# Patient Record
Sex: Male | Born: 1990
Health system: Southern US, Community
[De-identification: ages and names within clinical notes are randomized; demographics above are authoritative.]

## PROBLEM LIST (undated history)

## (undated) DIAGNOSIS — F909 Attention-deficit hyperactivity disorder, unspecified type: Secondary | ICD-10-CM

## (undated) DIAGNOSIS — F845 Asperger's syndrome: Secondary | ICD-10-CM

## (undated) DIAGNOSIS — K219 Gastro-esophageal reflux disease without esophagitis: Secondary | ICD-10-CM

## (undated) DIAGNOSIS — F32A Depression, unspecified: Secondary | ICD-10-CM

## (undated) DIAGNOSIS — F840 Autistic disorder: Secondary | ICD-10-CM

## (undated) HISTORY — DX: Asperger's syndrome: F84.5

## (undated) HISTORY — DX: Depression, unspecified: F32.A

## (undated) HISTORY — DX: Autistic disorder: F84.0

## (undated) HISTORY — PX: LACERATION REPAIR: SHX5168

---
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
## 2009-11-21 ENCOUNTER — Emergency Department (HOSPITAL_COMMUNITY): Admission: EM | Admit: 2009-11-21 | Discharge: 2009-11-21 | Payer: Self-pay | Admitting: Emergency Medicine

## 2010-03-19 ENCOUNTER — Encounter: Payer: Self-pay | Admitting: Family Medicine

## 2010-09-02 ENCOUNTER — Encounter: Payer: Self-pay | Admitting: Family Medicine

## 2010-11-12 ENCOUNTER — Ambulatory Visit: Admission: RE | Admit: 2010-11-12 | Discharge: 2010-11-12 | Payer: Self-pay | Source: Home / Self Care

## 2010-11-17 NOTE — Miscellaneous (Signed)
   Clinical Lists Changes  Problems: Removed problem of ASTHMA, UNSPECIFIED (ICD-493.90) 

## 2010-11-17 NOTE — Miscellaneous (Signed)
  Clinical Lists Changes  Medications: Removed medication of TUSSIONEX PENNKINETIC ER 8-10 MG/5ML LQCR (CHLORPHENIRAMINE-HYDROCODONE) 1 teaspoon twice a day as need for cough. dispense 60 ml Removed medication of RANITIDINE HCL 150 MG TABS (RANITIDINE HCL) Take 1 tablet by mouth twice a day Removed medication of TESSALON PERLES 100 MG  CAPS (BENZONATATE) 1 TAB by mouth every 8 hrs as needed cough Removed medication of CLARITIN 10 MG  TABS (LORATADINE) 1 tab by mouth daily Removed medication of NASACORT AQ 55 MCG/ACT  AERS (TRIAMCINOLONE ACETONIDE(NASAL)) 1 spray per nostril daily

## 2010-11-19 NOTE — Assessment & Plan Note (Signed)
Summary: cpe/kh   Vital Signs:  Patient profile:   20 year old male Height:      71.75 inches Weight:      216.9 pounds BMI:     29.73 Temp:     98.7 degrees F oral Pulse rate:   86 / minute BP sitting:   125 / 84  (left arm) Cuff size:   large  Vitals Entered By: Derrick Carey, CMA (November 12, 2010 3:57 PM) CC: CPE Is Patient Diabetic? No Pain Assessment Patient in pain? no        Primary Care Provider:  . [facility]  CC:  CPE.  History of Present Illness: 20 yo male here for routine physical exam.  UTD on shots. Refusing flu shot.  Hx ADHD but off all meds.  Current Medications (verified): 1)  None  Allergies (verified): No Known Drug Allergies  Past History:  Past Medical History: Last updated: 02/17/2007 GE Reflux Acne Allergic rhinitis "knocked out" front teeth  Family History: Reviewed history from 02/17/2007 and no changes required. Father with COPD, HTN, Migraines Mother: Obesity, Migraines, HTN  Social History: Has no siblings, mother Derrick Carey) is overweight.   Doing well in school, currently at [employer] getting good grades.  Review of Systems       See HPI  Physical Exam  General:  Well-developed,well-nourished,in no acute distress; alert,appropriate and cooperative throughout examination Head:  Normocephalic and atraumatic without obvious abnormalities. No apparent alopecia or balding. Eyes:  No corneal or conjunctival inflammation noted. EOMI. Perrla.  Ears:  External ear exam shows no significant lesions or deformities.  Otoscopic examination reveals clear canals, tympanic membranes are intact bilaterally without bulging, retraction, inflammation or discharge. Hearing is grossly normal bilaterally. Nose:  External nasal examination shows no deformity or inflammation. Nasal mucosa are pink and moist without lesions or exudates. Mouth:  Oral mucosa and oropharynx without lesions or exudates.   Neck:  No deformities, masses, or  tenderness noted. Lungs:  Normal respiratory effort, chest expands symmetrically. Lungs are clear to auscultation, no crackles or wheezes. Heart:  Normal rate and regular rhythm. S1 and S2 normal without gallop, murmur, click, rub or other extra sounds. Abdomen:  Bowel sounds positive,abdomen soft and non-tender without masses, organomegaly or hernias noted. Genitalia:  Refused. Msk:  No deformity or scoliosis noted of thoracic or lumbar spine.   Pulses:  R and L carotid,radial,femoral,dorsalis pedis and posterior tibial pulses are full and equal bilaterally Extremities:  No clubbing, cyanosis, edema, or deformity noted with normal full range of motion of all joints.   Neurologic:  No cranial nerve deficits noted. Station and gait are normal. Plantar reflexes are down-going bilaterally. DTRs are symmetrical throughout. Sensory, motor and coordinative functions appear intact. Skin:  Acne vulgaris on face.   Impression & Recommendations:  Problem # 1:  HEALTH MAINTENANCE EXAM (ICD-V70.0) Assessment New Normal exam. Guidance given.  Orders: [facility] - Est  18-39 yrs (16109)  Problem # 2:  ATTENTION DEFICIT, W/HYPERACTIVITY (ICD-314.01) Assessment: Improved Off meds, doing well in school. No desire to restart.  Orders: [facility] - Est  18-39 yrs (60454)  Problem # 3:  ACNE (ICD-706.1) Assessment: Comment Only Can discuss at next visit if pt desires.   Orders Added: 1)  [facility] - Est  18-39 yrs [99395]

## 2010-11-30 ENCOUNTER — Emergency Department (HOSPITAL_COMMUNITY)
Admission: EM | Admit: 2010-11-30 | Discharge: 2010-11-30 | Disposition: A | Discharge status: Home / Self Care | Payer: No Typology Code available for payment source | Attending: Emergency Medicine | Admitting: Emergency Medicine

## 2010-11-30 DIAGNOSIS — S139XXA Sprain of joints and ligaments of unspecified parts of neck, initial encounter: Secondary | ICD-10-CM | POA: Insufficient documentation

## 2010-11-30 DIAGNOSIS — M79609 Pain in unspecified limb: Secondary | ICD-10-CM | POA: Insufficient documentation

## 2010-11-30 DIAGNOSIS — R51 Headache: Secondary | ICD-10-CM | POA: Insufficient documentation

## 2010-11-30 DIAGNOSIS — M62838 Other muscle spasm: Secondary | ICD-10-CM | POA: Insufficient documentation

## 2011-01-12 ENCOUNTER — Ambulatory Visit (INDEPENDENT_AMBULATORY_CARE_PROVIDER_SITE_OTHER): Payer: No Typology Code available for payment source | Admitting: Sports Medicine

## 2011-01-12 ENCOUNTER — Encounter: Payer: Self-pay | Admitting: Sports Medicine

## 2011-01-12 VITALS — BP 130/76 | HR 70 | Temp 97.6°F | Ht 71.5 in | Wt 218.4 lb

## 2011-01-12 DIAGNOSIS — S335XXA Sprain of ligaments of lumbar spine, initial encounter: Secondary | ICD-10-CM

## 2011-01-12 DIAGNOSIS — S39012A Strain of muscle, fascia and tendon of lower back, initial encounter: Secondary | ICD-10-CM | POA: Insufficient documentation

## 2011-01-12 MED ORDER — MELOXICAM 15 MG PO TABS: 15.0000 mg | ORAL_TABLET | Freq: Every day | ORAL | Status: DC

## 2011-01-12 NOTE — Patient Instructions (Signed)

## 2011-01-12 NOTE — Assessment & Plan Note (Signed)
No radicular symptoms. No signs cord compression/CES. Mobic daily. Rehab exercises.
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
## 2011-02-01 ENCOUNTER — Ambulatory Visit: Payer: Self-pay | Admitting: Sports Medicine

## 2011-02-02 ENCOUNTER — Encounter: Payer: Self-pay | Admitting: Sports Medicine

## 2011-02-02 ENCOUNTER — Ambulatory Visit (INDEPENDENT_AMBULATORY_CARE_PROVIDER_SITE_OTHER): Payer: Self-pay | Admitting: Sports Medicine

## 2011-02-02 VITALS — BP 118/86 | HR 80 | Temp 98.2°F | Wt 220.2 lb

## 2011-02-02 DIAGNOSIS — S335XXA Sprain of ligaments of lumbar spine, initial encounter: Secondary | ICD-10-CM

## 2011-02-02 DIAGNOSIS — S39012A Strain of muscle, fascia and tendon of lower back, initial encounter: Secondary | ICD-10-CM

## 2011-02-02 NOTE — Assessment & Plan Note (Signed)
Resolved

## 2011-02-02 NOTE — Progress Notes (Signed)
  Subjective:    Patient ID: Derrick Carey, male    DOB: 17-Mar-1991, 20 y.o.   MRN: [id number]  HPI Pt returns for r/u of lumbar strain.  He has been doing sports med rehab exercises and taking mobic.  Pain is 100% resolved and he is back to baseline.  No pain, HA, N/V/C/D, SOB/CP, stool/urinary incontinence.   Review of Systems    See HPI Objective:   Physical Exam  Constitutional: He appears well-developed and well-nourished. No distress.  Musculoskeletal:       Gait unremarkable.  No TTP over back.  Skin: Skin is warm and dry.       Inflammatory acne noted on face.          Assessment & Plan:

## 2011-06-23 ENCOUNTER — Ambulatory Visit: Payer: Self-pay | Admitting: Family Medicine

## 2011-06-24 ENCOUNTER — Ambulatory Visit: Payer: Self-pay | Admitting: Family Medicine

## 2011-07-02 ENCOUNTER — Ambulatory Visit: Payer: Self-pay | Admitting: Family Medicine

## 2011-07-20 ENCOUNTER — Ambulatory Visit: Payer: Self-pay | Admitting: Family Medicine

## 2011-08-12 ENCOUNTER — Ambulatory Visit (INDEPENDENT_AMBULATORY_CARE_PROVIDER_SITE_OTHER): Payer: Self-pay | Admitting: Family Medicine

## 2011-08-12 ENCOUNTER — Encounter: Payer: Self-pay | Admitting: Family Medicine

## 2011-08-12 VITALS — BP 130/76 | HR 69 | Temp 98.5°F | Ht 72.0 in | Wt 223.0 lb

## 2011-08-12 DIAGNOSIS — R053 Chronic cough: Secondary | ICD-10-CM | POA: Insufficient documentation

## 2011-08-12 DIAGNOSIS — J069 Acute upper respiratory infection, unspecified: Secondary | ICD-10-CM

## 2011-08-12 DIAGNOSIS — R05 Cough: Secondary | ICD-10-CM | POA: Insufficient documentation

## 2011-08-12 DIAGNOSIS — Z23 Encounter for immunization: Secondary | ICD-10-CM

## 2011-08-12 HISTORY — DX: Chronic cough: R05.3

## 2011-08-12 NOTE — Patient Instructions (Addendum)
You have a virus which your body will fight off on its own. If you develop fevers, face pain, worsening of symptoms in next 7-10 days, come back to clinic because you may have developed a bacterial infection. Nasal saline spray helps clear out sinuses. Throat discomfort try motrin, cough drops or salt water gargling.  Upper Respiratory Infection, Adult An upper respiratory infection (URI) is also known as the common cold. It is often caused by a type of germ (virus). Colds are easily spread (contagious). You can pass it to others by kissing, coughing, sneezing, or drinking out of the same glass. Usually, you get better in 1 or 2 weeks.  HOME CARE   Only take medicine as told by your doctor.   Use a warm mist humidifier or breathe in steam from a hot shower.   Drink enough water and fluids to keep your pee (urine) clear or pale yellow.   Get plenty of rest.   Return to work when your temperature is back to normal or as told by your doctor. You may use a face mask and wash your hands to stop your cold from spreading.  GET HELP RIGHT AWAY IF:   After the first few days, you feel you are getting worse.   You have questions about your medicine.   You have chills, shortness of breath, or brown or red spit (mucus).   You have yellow or brown snot (nasal discharge) or pain in the face, especially when you bend forward.   You have a fever, puffy (swollen) neck, pain when you swallow, or white spots in the back of your throat.   You have a bad headache, ear pain, sinus pain, or chest pain.   You have a high-pitched whistling sound when you breathe in and out (wheezing).   You have a lasting cough or cough up blood.   You have sore muscles or a stiff neck.  MAKE SURE YOU:   Understand these instructions.   Will watch your condition.   Will get help right away if you are not doing well or get worse.  Document Released: 03/22/2008 Document Revised: 06/16/2011 Document Reviewed:  02/08/2011 [facility] Patient Information 2012 [place], [place].

## 2011-08-12 NOTE — Assessment & Plan Note (Addendum)
Consistent with viral URI symptoms: no fever or lymphadenopathy concerning for bacterial pharyngitis. Discussed symptomatic treatment for her sore throat and nasal congestion. Discussed red flag symptoms including facial pain, fever, worsening symptoms in the next 7-10 days. Refused flu vaccine, given TDap today.

## 2011-08-12 NOTE — Progress Notes (Signed)
  Subjective:    Patient ID: Derrick Carey, male    DOB: May 16, 1991, 20 y.o.   MRN: [id number]  HPI  1. URI symptoms. Pt c/o sore throat, post-nasal drip and cough productive of green sputum for 1 week, symptoms are stable. He also states that he has some sinus pressure that started today. Patient says that he might have had a fever last night and this morning but did not check it and doesn't know for sure. His mother and grandmother have the same symptoms.   He denies headache, sinus pain, abdominal pain, earache, itchy eyes, loss of appetite, SOB or chest pain. He is not a smoker, but is exposed to second hand smoke.    Review of Systems See HPI otherwise negative.    Objective:   Physical Exam  Vitals reviewed. Constitutional: He appears well-developed and well-nourished. No distress.  HENT:  Head: Normocephalic.  Right Ear: External ear normal.  Left Ear: External ear normal.  Mouth/Throat: Oropharynx is clear and moist. No oropharyngeal exudate.       Injected posterior pharynx.  pururlent nasal congestion. No sinus TTP.  Eyes: Pupils are equal, round, and reactive to light.  Neck: Neck supple. No tracheal deviation present. No thyromegaly present.  Cardiovascular: Normal rate, regular rhythm and normal heart sounds.  Exam reveals no gallop and no friction rub.   No murmur heard. Pulmonary/Chest: Effort normal and breath sounds normal. No respiratory distress. He has no wheezes. He has no rales. He exhibits no tenderness.  Lymphadenopathy:    He has no cervical adenopathy.  Skin: He is not diaphoretic.  Psychiatric: He has a normal mood and affect. His behavior is normal.          Assessment & Plan:

## 2011-08-19 ENCOUNTER — Emergency Department (HOSPITAL_BASED_OUTPATIENT_CLINIC_OR_DEPARTMENT_OTHER)
Admission: EM | Admit: 2011-08-19 | Discharge: 2011-08-19 | Disposition: A | Discharge status: Home / Self Care | Payer: Self-pay | Attending: Emergency Medicine | Admitting: Emergency Medicine

## 2011-08-19 ENCOUNTER — Emergency Department (INDEPENDENT_AMBULATORY_CARE_PROVIDER_SITE_OTHER): Payer: Self-pay

## 2011-08-19 ENCOUNTER — Encounter (HOSPITAL_BASED_OUTPATIENT_CLINIC_OR_DEPARTMENT_OTHER): Payer: Self-pay | Admitting: *Deleted

## 2011-08-19 DIAGNOSIS — R05 Cough: Secondary | ICD-10-CM

## 2011-08-19 DIAGNOSIS — R059 Cough, unspecified: Secondary | ICD-10-CM | POA: Insufficient documentation

## 2011-08-19 DIAGNOSIS — B9789 Other viral agents as the cause of diseases classified elsewhere: Secondary | ICD-10-CM | POA: Insufficient documentation

## 2011-08-19 DIAGNOSIS — J3489 Other specified disorders of nose and nasal sinuses: Secondary | ICD-10-CM | POA: Insufficient documentation

## 2011-08-19 DIAGNOSIS — B349 Viral infection, unspecified: Secondary | ICD-10-CM

## 2011-08-19 DIAGNOSIS — K219 Gastro-esophageal reflux disease without esophagitis: Secondary | ICD-10-CM | POA: Insufficient documentation

## 2011-08-19 DIAGNOSIS — R509 Fever, unspecified: Secondary | ICD-10-CM

## 2011-08-19 HISTORY — DX: Gastro-esophageal reflux disease without esophagitis: K21.9

## 2011-08-19 HISTORY — DX: Attention-deficit hyperactivity disorder, unspecified type: F90.9

## 2011-08-19 MED ORDER — HYDROCOD POLST-CHLORPHEN POLST 10-8 MG/5ML PO LQCR: 5.0000 mL | Freq: Every evening | ORAL | Status: DC | PRN

## 2011-08-19 NOTE — ED Notes (Signed)
Pt c/o nasal congestion and pressure

## 2011-08-19 NOTE — ED Provider Notes (Signed)
History     CSN: [id number] Arrival date & time: 08/19/2011  8:18 PM   First MD Initiated Contact with Patient 08/19/11 2020      Chief Complaint  Patient presents with  . Nasal Congestion  . Cough    (Consider location/radiation/quality/duration/timing/severity/associated sxs/prior treatment) Patient is a 20 y.o. male presenting with cough. The history is provided by the patient. No language interpreter was used.  Cough This is a new problem. The current episode started more than 1 week ago. The problem occurs constantly. The problem has not changed since onset.The cough is productive of sputum. There has been no fever. Associated symptoms include ear congestion, rhinorrhea and sore throat. He has tried nothing for the symptoms. He is not a smoker.    Past Medical History  Diagnosis Date  . GERD (gastroesophageal reflux disease)   . ADHD (attention deficit hyperactivity disorder)     History reviewed. No pertinent past surgical history.  History reviewed. No pertinent family history.  History  Substance Use Topics  . Smoking status: Never Smoker   . Smokeless tobacco: Not on file  . Alcohol Use: Not on file      Review of Systems  HENT: Positive for sore throat and rhinorrhea.   Respiratory: Positive for cough.   All other systems reviewed and are negative.    Allergies  Sulfa antibiotics  Home Medications   Current Outpatient Rx  Name Route Sig Dispense Refill  . ESOMEPRAZOLE MAGNESIUM 40 MG PO CPDR Oral Take 40 mg by mouth daily.      . GUAIFENESIN 100 MG/5ML PO SOLN Oral Take 10 mLs by mouth 2 (two) times daily as needed. For cough      . HYDROCOD POLST-CHLORPHEN POLST 10-8 MG/5ML PO LQCR Oral Take 5 mLs by mouth at bedtime as needed. 140 mL 0    BP 127/81  Pulse 93  Temp(Src) 98.3 F (36.8 C) (Oral)  Resp 16  Ht 6\' 3"  (1.905 m)  Wt 210 lb (95.255 kg)  BMI 26.25 kg/m2  SpO2 98%  Physical Exam  Nursing note and vitals reviewed. Constitutional:  He is oriented to person, place, and time. He appears well-developed and well-nourished.  HENT:  Head: Normocephalic and atraumatic.  Right Ear: External ear normal.  Left Ear: External ear normal.  Nose: Rhinorrhea present.  Mouth/Throat: Posterior oropharyngeal erythema present.  Cardiovascular: Normal rate and regular rhythm.   Pulmonary/Chest: Effort normal and breath sounds normal.  Musculoskeletal: Normal range of motion.  Neurological: He is alert and oriented to person, place, and time.  Skin: Skin is warm and dry.  Psychiatric: He has a normal mood and affect.    ED Course  Procedures (including critical care time)  Labs Reviewed - No data to display Dg Chest 2 View  08/19/2011  *RADIOLOGY REPORT*  Clinical Data: Cough, intermittent fever  CHEST - 2 VIEW  Comparison: None  Findings: Normal heart size, mediastinal contours, and pulmonary vascularity. Lungs clear. Bones unremarkable. No pneumothorax.  IMPRESSION: Normal exam.  Original Report Authenticated By: Lollie Marrow, M.D.     1. Viral illness   2. Cough       MDM  No need for antibiotics at this time:will treat symptomatically    Medical screening examination/treatment/procedure(s) were performed by non-physician practitioner and as supervising physician I was immediately available for consultation/collaboration. Osvaldo Human, M.D.     Teressa Lower, NP 08/19/11 2105  Carleene Cooper III, MD 08/20/11 [phone]

## 2011-09-07 ENCOUNTER — Encounter: Payer: Self-pay | Admitting: Family Medicine

## 2011-09-07 ENCOUNTER — Ambulatory Visit (INDEPENDENT_AMBULATORY_CARE_PROVIDER_SITE_OTHER): Payer: Self-pay | Admitting: Family Medicine

## 2011-09-07 VITALS — BP 124/83 | HR 109 | Temp 99.1°F | Ht 72.0 in | Wt 226.4 lb

## 2011-09-07 DIAGNOSIS — J069 Acute upper respiratory infection, unspecified: Secondary | ICD-10-CM

## 2011-09-07 MED ORDER — AZITHROMYCIN 500 MG PO TABS: 500.0000 mg | ORAL_TABLET | Freq: Every day | ORAL | Status: AC

## 2011-09-07 MED ORDER — PREDNISONE 50 MG PO TABS: 50.0000 mg | ORAL_TABLET | Freq: Every day | ORAL | Status: DC

## 2011-09-07 MED ORDER — GUAIFENESIN-CODEINE 100-10 MG/5ML PO SYRP: 5.0000 mL | ORAL_SOLUTION | Freq: Three times a day (TID) | ORAL | Status: AC | PRN

## 2011-09-07 NOTE — Assessment & Plan Note (Signed)
Because patient has had cough symptoms for over one month, will give prescription for azithromycin 500 mg daily x5 days. Although not documented, father states that patient has a history of asthma. Per family's request, will give a short prednisone burst, prednisone 50 mg one tablet daily x5 days. Will also give prescription for her Robitussin a.c. Advised patient to take before bedtime and avoid any driving or operating heavy machinery. Patient to followup with his PCP in 2 weeks or sooner if symptoms worsen.  Family and patient agree with plan.

## 2011-09-07 NOTE — Progress Notes (Signed)
  Subjective:    Patient ID: Derrick Carey, male    DOB: 29-Jul-1991, 20 y.o.   MRN: [id number]  HPI  This is a 20 year old male who presents as a work in for persistent cough. History is provided by father. Cough has been going on for about one month. Patient was seen by Dr. Cristal Ford about one month ago and was diagnosed with viral URI. Was given over-the-counter prescriptions for Robitussin and Tussionex. Patient states that neither medications relieved his symptoms. He states that rhinorrhea, congestion has resolved, but cough persists. Cough is productive, yellow phlegm at times. Cough is worse at night when he lays down. Coughing spells lead to sore throat, chest discomfort, and posttussive emesis x1.   Denies any subjective fevers at home, chills, night sweats. Patient still has a good appetite. No changes in urine output or bowel movements. Of note, father says patient has a history of asthma (not documented) and when he was a child he was given a short course of prednisone which relieved his symptoms.  Review of Systems Per history of present illness    Objective:   Physical Exam  Constitutional: No distress.       Coughing throughout encounter  HENT:  Head: Normocephalic and atraumatic.  Nose: Nose normal.  Mouth/Throat: Oropharynx is clear and moist. No oropharyngeal exudate.  Eyes: Right eye exhibits no discharge. Left eye exhibits no discharge.  Neck: Normal range of motion.  Cardiovascular: Normal heart sounds.  Exam reveals no gallop and no friction rub.   No murmur heard. Pulmonary/Chest: Effort normal and breath sounds normal. He has no wheezes. He has no rales.  Lymphadenopathy:    He has no cervical adenopathy.  Skin: Skin is warm. No rash noted. No erythema.  Psychiatric: He is slowed and withdrawn. He is attentive.          Assessment & Plan:

## 2011-09-07 NOTE — Patient Instructions (Signed)
If symptoms do not improve in 2 weeks, please schedule appointment with PCP. Hope you feel better!  Cough, Adult  A cough is a reflex that helps clear your throat and airways. It can help heal the body or may be a reaction to an irritated airway. A cough may only last 2 or 3 weeks (acute) or may last more than 8 weeks (chronic).  CAUSES Acute cough:  Viral or bacterial infections.  Chronic cough:  Infections.   Allergies.   Asthma.   Post-nasal drip.   Smoking.   Heartburn or acid reflux.   Some medicines.   Chronic lung problems (COPD).   Cancer.  SYMPTOMS   Cough.   Fever.   Chest pain.   Increased breathing rate.   High-pitched whistling sound when breathing (wheezing).   Colored mucus that you cough up (sputum).  TREATMENT   A bacterial cough may be treated with antibiotic medicine.   A viral cough must run its course and will not respond to antibiotics.   Your caregiver may recommend other treatments if you have a chronic cough.  HOME CARE INSTRUCTIONS   Only take over-the-counter or prescription medicines for pain, discomfort, or fever as directed by your caregiver. Use cough suppressants only as directed by your caregiver.   Use a cold steam vaporizer or humidifier in your bedroom or home to help loosen secretions.   Sleep in a semi-upright position if your cough is worse at night.   Rest as needed.   Stop smoking if you smoke.  SEEK IMMEDIATE MEDICAL CARE IF:   You have pus in your sputum.   Your cough starts to worsen.   You cannot control your cough with suppressants and are losing sleep.   You begin coughing up blood.   You have difficulty breathing.   You develop pain which is getting worse or is uncontrolled with medicine.   You have a fever.  MAKE SURE YOU:   Understand these instructions.   Will watch your condition.   Will get help right away if you are not doing well or get worse.  Document Released: 04/02/2011 Document  Revised: 06/16/2011 Document Reviewed: 04/02/2011 [facility] Patient Information 2012 [place], [place].

## 2011-10-05 ENCOUNTER — Encounter: Payer: Self-pay | Admitting: Family Medicine

## 2011-10-05 ENCOUNTER — Ambulatory Visit (INDEPENDENT_AMBULATORY_CARE_PROVIDER_SITE_OTHER): Payer: Self-pay | Admitting: Family Medicine

## 2011-10-05 DIAGNOSIS — J069 Acute upper respiratory infection, unspecified: Secondary | ICD-10-CM

## 2011-10-05 DIAGNOSIS — F909 Attention-deficit hyperactivity disorder, unspecified type: Secondary | ICD-10-CM

## 2011-10-05 MED ORDER — HYDROCOD POLST-CHLORPHEN POLST 10-8 MG/5ML PO LQCR: 5.0000 mL | Freq: Two times a day (BID) | ORAL | Status: DC | PRN

## 2011-10-05 MED ORDER — METHYLPHENIDATE HCL 10 MG PO TABS: 10.0000 mg | ORAL_TABLET | Freq: Two times a day (BID) | ORAL | Status: DC

## 2011-10-05 NOTE — Progress Notes (Signed)
Subjective: Pt presents today with two complaints. 1) URI.  Pt reports that he was treated for URI about 3 weeks ago.  Symptoms had completely resolved as of 2 weeks ago, then about 4 days ago began to happen again.  Has cough, congestion, post nasal drip.  Cough is relatively non-productive and is his main complaint.  No fever/chills, shortness of breath.  Is around smokers a fair amount and has several contacts who have similar symptoms.  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  Has been tried on ritalan, concerta, and adderall for this.  Took himself off of them several years ago because he didn't like how they made him feel.  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  Is interested in trying ritalan.  Does continue to have problems with inattention that cause problems with his day-to-day function but doesn't want "to feel like a zombie."  Objective:  Filed Vitals:   10/05/11 0910  BP: 124/80  Pulse: 72  Temp: 98.7 F (37.1 C)   Gen: NAD HEENT: TM clear, clear nasal drainage, no facial tenderness CV: RRR Resp: CTABL, has coughing spells Ext: No edema, 2+ pulses  Assessment/Plan: Symptomatic treatment for URI, attempt start Ritalan for ADHD.  RTC 1 month.  Please also see individual problems in problem list for problem-specific plans.

## 2011-10-05 NOTE — Assessment & Plan Note (Signed)
Asked patient to talk with parents about finding some of the documentation from his interactions with mental health.  Will give prescription for Ritalan.  As patient has only orange card, if he cannot afford this, instructed him to talk with MAP program and advise me on what medications are a possibility.  I will write prescription for a one month supply of this medication.

## 2011-10-05 NOTE — Patient Instructions (Signed)
I am giving you a prescription for Ritalin. Unfortunately, the health Department will not stop this medication. If you have an orange card he may use to [facility]. However, I do not believe that this medication, or any other medications for ADHD, are on the orange card formulary. I believe Ritalin would be the least expensive. You can also talk with the health department about what they would cover her through the medication assistance program. I am giving you a refill on the Tussionex. He can take it up to 2 times daily. Unfortunately, that is going to be the best thing for your cough. If you start having significant problems with fevers, chills, or facial pain, please come back to see [place].

## 2011-10-05 NOTE — Assessment & Plan Note (Signed)
Got completely better, now with new episode.  <10 days, no evidence of bacterial infection.  Symptomatic tx only.

## 2011-10-26 ENCOUNTER — Encounter: Payer: Self-pay | Admitting: Family Medicine

## 2011-10-26 ENCOUNTER — Ambulatory Visit (INDEPENDENT_AMBULATORY_CARE_PROVIDER_SITE_OTHER): Payer: Self-pay | Admitting: Family Medicine

## 2011-10-26 VITALS — BP 129/79 | HR 79 | Temp 98.2°F | Ht 72.0 in | Wt 219.4 lb

## 2011-10-26 DIAGNOSIS — A499 Bacterial infection, unspecified: Secondary | ICD-10-CM

## 2011-10-26 DIAGNOSIS — B9689 Other specified bacterial agents as the cause of diseases classified elsewhere: Secondary | ICD-10-CM

## 2011-10-26 DIAGNOSIS — J329 Chronic sinusitis, unspecified: Secondary | ICD-10-CM

## 2011-10-26 DIAGNOSIS — J209 Acute bronchitis, unspecified: Secondary | ICD-10-CM

## 2011-10-26 MED ORDER — FLUTICASONE PROPIONATE 50 MCG/ACT NA SUSP: 2.0000 | Freq: Every day | NASAL | Status: DC

## 2011-10-26 MED ORDER — GUAIFENESIN-CODEINE 100-10 MG/5ML PO SYRP: 5.0000 mL | ORAL_SOLUTION | Freq: Three times a day (TID) | ORAL | Status: AC | PRN

## 2011-10-26 MED ORDER — AMOXICILLIN 500 MG PO CAPS: 500.0000 mg | ORAL_CAPSULE | Freq: Three times a day (TID) | ORAL | Status: AC

## 2011-10-26 NOTE — Progress Notes (Signed)
Subjective: The patient is a 21 y.o. year old male who presents today for continued problems with cough, postnasal drip,  And sinus pressure.  The patient reports that his symptoms have continue to wax and wane since his last appointment.  His cough continues to be the most bothersome feature.  He has not been running significant fevers.  His cough is only mildly productive of clear sputum.  He has not been having fevers, chills, shortness of breath, chest pain, palpitations, but does admit to some sinus pressure.  Objective:  Filed Vitals:   10/26/11 1408  BP: 129/79  Pulse: 79  Temp: 98.2 F (36.8 C)   Gen: well developed male, coughing intermittently, no acute distress H EENT: mucous membranes moist, trachea midline.  Mild cobble stoning of the pharynx.  There is mild bilateral facial tenderness.  No cervical lymphadenopathy. CV: regular rate and rhythm, no murmurs appreciated Resp: slightly course breath sounds, good air movement bilaterally, no wheezes Ext: less than 2 second capillary refill  Assessment/Plan: Possible sinus infection with the subacute bronchitis.  I also wonder if there is an allergic component seeing the the patient has had relatively poor response to other treatments.  I will try a cough suppressant with codeine as the patient reports that Tussionex is very expensive.  I will treat likely bronchitis with amoxicillin and give a trial of daily antihistamines accompanied by nasal steroids.  Please also see individual problems in problem list for problem-specific plans.  Continued problems

## 2011-10-26 NOTE — Patient Instructions (Signed)
I want you to use two sprays of the nasal spray into each nostril every morning. I have sent in another cough medicine. I want you to take an allergy medicine every day (either Claritin, Zyrtec, or Allegra). I am also giving you a different antibiotic to take three times a day for 10 days.

## 2011-11-30 ENCOUNTER — Ambulatory Visit: Payer: Self-pay | Admitting: Family Medicine

## 2011-12-21 ENCOUNTER — Ambulatory Visit: Payer: Self-pay | Admitting: Family Medicine

## 2012-01-19 ENCOUNTER — Ambulatory Visit: Payer: Self-pay | Admitting: Family Medicine

## 2012-02-08 ENCOUNTER — Ambulatory Visit (INDEPENDENT_AMBULATORY_CARE_PROVIDER_SITE_OTHER): Payer: Self-pay | Admitting: Family Medicine

## 2012-02-08 ENCOUNTER — Encounter: Payer: Self-pay | Admitting: Family Medicine

## 2012-02-08 VITALS — BP 131/76 | HR 90 | Ht 71.0 in | Wt 220.0 lb

## 2012-02-08 DIAGNOSIS — L853 Xerosis cutis: Secondary | ICD-10-CM

## 2012-02-08 DIAGNOSIS — R238 Other skin changes: Secondary | ICD-10-CM

## 2012-02-08 DIAGNOSIS — Z760 Encounter for issue of repeat prescription: Secondary | ICD-10-CM

## 2012-02-08 DIAGNOSIS — F909 Attention-deficit hyperactivity disorder, unspecified type: Secondary | ICD-10-CM

## 2012-02-08 HISTORY — DX: Xerosis cutis: L85.3

## 2012-02-08 MED ORDER — DESONIDE 0.05 % EX CREA: TOPICAL_CREAM | Freq: Two times a day (BID) | CUTANEOUS | Status: DC

## 2012-02-08 MED ORDER — METHYLPHENIDATE HCL 10 MG PO TABS: 10.0000 mg | ORAL_TABLET | Freq: Two times a day (BID) | ORAL | Status: DC

## 2012-02-08 NOTE — Assessment & Plan Note (Addendum)
Treatment desonide for several weeks. Patient instructed to return if symptoms are not improving.

## 2012-02-08 NOTE — Assessment & Plan Note (Addendum)
Patient will receive a new refill for his Ritalin.

## 2012-02-08 NOTE — Patient Instructions (Signed)
Check in to what is necessary to get the extended release ritalin. Come back to see me in 1 month so we can talk about it. I am giving you a steroid cream to put behind your ear.  If it is not available from the health department, it should be available from [facility] for $4.

## 2012-02-08 NOTE — Progress Notes (Signed)
Subjective:     Patient ID: Derrick Carey, male   DOB: 15-Sep-1991, 21 y.o.   MRN: [id number]  HPI The patient is a 21 yo M that comes to the clinic with a request for a refill on his ADHD medication.  In addition, he complains of 1 mth hx of some dry skin located behind his right ear.  Denies any discharge or hearing loss from his ear.  Review of Systems  Constitutional: Negative.   HENT: Negative.   Eyes: Negative.   Respiratory: Negative.   Cardiovascular: Negative.   Gastrointestinal: Negative.   Genitourinary: Negative.   Musculoskeletal: Negative.   Neurological: Negative.   Hematological: Negative.   Psychiatric/Behavioral: Positive for decreased concentration and agitation.       Objective:   Physical Exam  Constitutional: He is oriented to person, place, and time. He appears well-developed and well-nourished.  HENT:  Head: Normocephalic.  Eyes: EOM are normal.  Neck: Normal range of motion.  Cardiovascular: Normal rate, regular rhythm and normal heart sounds.   Pulmonary/Chest: Effort normal and breath sounds normal.  Abdominal: Soft. Bowel sounds are normal.  Musculoskeletal: Normal range of motion.  Neurological: He is alert and oriented to person, place, and time.  Skin: Skin is warm.       dry skin located in back of right ear  Psychiatric: He has a normal mood and affect.   Resident addendum: Subjective: The patient is a 21 y.o. year old male who presents today for ADHD med refill and dry skin behind his right ear.  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 Briefly, patient has a long history of ADHD and was restarted on medications for this back in December. It has somewhat poor followup history since that time but is here for medication refill. Both he and his parents feel that the medication is helpful he is taking it.  Patient also reports that for the last several weeks he is been having some problems with dry skin located behind  his right ear. He has had this type of problem before and was treated with some type of ointment. The area is not particularly pruritic but is somewhat bothersome.  Objective:  Filed Vitals:   02/08/12 1604  BP: 131/76  Pulse: 90   Gen: No acute distress HEENT: There is some thickened dry skin behind the right ear. This area is slightly reddish and scaly in appearance. CV: Regular rate and rhythm Resp: Clear to auscultation bilaterally  Assessment/Plan:  Please also see individual problems in problem list for problem-specific plans.

## 2012-02-10 NOTE — Assessment & Plan Note (Signed)
I will refill the medication for one month. The patient has expressed some desire to switch to extended release Ritalin. I have instructed him and his parents to look into the process for this and he will likely need to go to the medication assistance program. We will discuss this further when he returns in one month.

## 2012-03-22 ENCOUNTER — Ambulatory Visit (INDEPENDENT_AMBULATORY_CARE_PROVIDER_SITE_OTHER): Payer: Self-pay | Admitting: Family Medicine

## 2012-03-22 ENCOUNTER — Encounter: Payer: Self-pay | Admitting: Family Medicine

## 2012-03-22 VITALS — BP 115/75 | HR 69 | Temp 96.9°F | Ht 71.0 in | Wt 219.1 lb

## 2012-03-22 DIAGNOSIS — L853 Xerosis cutis: Secondary | ICD-10-CM

## 2012-03-22 DIAGNOSIS — F909 Attention-deficit hyperactivity disorder, unspecified type: Secondary | ICD-10-CM

## 2012-03-22 DIAGNOSIS — R238 Other skin changes: Secondary | ICD-10-CM

## 2012-03-22 MED ORDER — DESONIDE 0.05 % EX CREA: TOPICAL_CREAM | Freq: Two times a day (BID) | CUTANEOUS | Status: AC

## 2012-03-22 MED ORDER — METHYLPHENIDATE HCL 10 MG PO TABS: 10.0000 mg | ORAL_TABLET | Freq: Two times a day (BID) | ORAL | Status: DC

## 2012-03-22 NOTE — Patient Instructions (Signed)
It was good to see you today! Make certain to schedule an appointment in 3 months for refills of your medications.

## 2012-03-23 NOTE — Assessment & Plan Note (Signed)
Continue steroid cream and moisturizers.  Has h/o eczema and this is fitting in perfectly.

## 2012-03-23 NOTE — Assessment & Plan Note (Signed)
No concerns on exam or history.  Continue meds, refill in 3 months.

## 2012-03-23 NOTE — Progress Notes (Signed)
Patient ID: Derrick Carey, male   DOB: 25-Aug-1991, 21 y.o.   MRN: [id number] Subjective: The patient is a 21 y.o. year old male who presents today for f/u multiple issues.  1. ADHD: Patient has noticeable increase in concentration, attitude, and goal-directed activity on medication.  No problems with sleeplessness, decreased weight or decreased appetite.  2. Dry skin/eczma: Patient out of desonide cream.  This was generally helpful with area behind ear.  Has been continuing to use moisturizer but skin is still somewhat scaly.  Is now having some problems on elbows as well.  Patient's past medical, social, and family history were reviewed and updated as appropriate. History  Substance Use Topics  . Smoking status: Former Smoker -- 0.5 packs/day for 1 years    Types: Cigarettes  . Smokeless tobacco: Never Used  . Alcohol Use: Not on file   Objective:  Filed Vitals:   03/22/12 1031  BP: 115/75  Pulse: 69  Temp: 96.9 F (36.1 C)   Gen: NAD, overweight HEENT: Skin behind right ear is still somewhat lichenified, significantly improved from previous and significantly smaller than previous. CV: RRR Resp: CTABL Ext: No edema, slight dryness and irritation of elbows, R>L  Assessment/Plan:  Please also see individual problems in problem list for problem-specific plans.

## 2012-06-02 ENCOUNTER — Encounter: Payer: Self-pay | Admitting: Family Medicine

## 2012-06-02 ENCOUNTER — Ambulatory Visit (INDEPENDENT_AMBULATORY_CARE_PROVIDER_SITE_OTHER): Payer: Self-pay | Admitting: Family Medicine

## 2012-06-02 VITALS — BP 122/84 | HR 88 | Ht 73.0 in | Wt 218.0 lb

## 2012-06-02 DIAGNOSIS — F909 Attention-deficit hyperactivity disorder, unspecified type: Secondary | ICD-10-CM

## 2012-06-02 MED ORDER — METHYLPHENIDATE HCL 10 MG PO TABS: 10.0000 mg | ORAL_TABLET | Freq: Two times a day (BID) | ORAL | Status: DC

## 2012-06-02 NOTE — Progress Notes (Signed)
Patient ID: Derrick Carey, male   DOB: [birth year], 21 y.o.   MRN: [id number] Subjective: The patient is a 21 y.o. year old male who presents today for f/u and injury  1. Right wrist: Hurt right had when getting out of bed 6 days ago, noticed that it got worse 4 days ago when work week started.  No falls but does have some repetitive stresses at work.  (Lifts 95lb boxes all day).  No swelling.  Has been taking motrin to minimal effect.  2. ADD: Currently well controlled on ritalin 10mg  BID.  No concentration problems at work  Patient's past medical, social, and family history were reviewed and updated as appropriate. History  Substance Use Topics  . Smoking status: Former Smoker -- 0.5 packs/day for 1 years    Types: Cigarettes  . Smokeless tobacco: Never Used  . Alcohol Use: Not on file   Objective:  Filed Vitals:   06/02/12 1345  BP: 122/84  Pulse: 88   Gen: NAD Ext: No pain on palpation or compression of the hand or wrist.  No pain on opposed supination/pronation.  Pain with opposed flexion and with palpation over the biceps and flexor muscles of the forearm.  Full ROM without any bony deformities.  Assessment/Plan: Right arm strain: Will recommend wrist brace and light duty at work.  Please also see individual problems in problem list for problem-specific plans.

## 2012-06-02 NOTE — Assessment & Plan Note (Signed)
Refill for 3 months today.

## 2012-07-17 ENCOUNTER — Ambulatory Visit (INDEPENDENT_AMBULATORY_CARE_PROVIDER_SITE_OTHER): Payer: Self-pay | Admitting: Family Medicine

## 2012-07-17 ENCOUNTER — Encounter: Payer: Self-pay | Admitting: Family Medicine

## 2012-07-17 VITALS — BP 98/68 | HR 78 | Temp 97.9°F | Ht 72.0 in | Wt 216.8 lb

## 2012-07-17 DIAGNOSIS — J069 Acute upper respiratory infection, unspecified: Secondary | ICD-10-CM

## 2012-07-17 MED ORDER — MOMETASONE FUROATE 50 MCG/ACT NA SUSP: 2.0000 | Freq: Every day | NASAL | Status: DC

## 2012-07-17 NOTE — Patient Instructions (Signed)
You have a viral infection that will resolve on its own over time.  Symptoms typically last 3-7 days but can stretch out to 2-3 weeks. You can use afrin for up to 5 days may help with congestion, mucinex or robitussin DM for cough., and sudafed if you don't have high blood pressure.  Unfortunately, antibiotics are not helpful for viral infections. Drink plenty of fluids and stay hydrated! Wash your hands frequently. Call if you are not improving by 7-10 days.  I want you to also get some allergy medicine such as Allegra, Zyrtec, or Claritin and take it daily. I am giving you a prescription for a nose spray that is a steroid.  It will help over the course of a week.

## 2012-07-17 NOTE — Progress Notes (Signed)
Patient ID: Derrick Carey, male   DOB: [birth year], 21 y.o.   MRN: [id number] Subjective: The patient is a 21 y.o. year old male who presents today for cough and fever.  Feels facial fullness and facial pain since last night.  Cough started about 5 days ago.  Some subjective fevers.  Since yesterday has green nasal drainage.  Cough is non-productive.  Has only been taking Advil.  Reports he has problems with seasonal allergies but doesn't take anything for it.  No n/v/d.  No rashes.  Patient's past medical, social, and family history were reviewed and updated as appropriate. History  Substance Use Topics  . Smoking status: Former Smoker -- 0.5 packs/day for 1 years    Types: Cigarettes  . Smokeless tobacco: Never Used  . Alcohol Use: Not on file   Objective:  Filed Vitals:   07/17/12 0907  BP: 98/68  Pulse: 78  Temp: 97.9 F (36.6 C)   Gen: NAD HEENT: Mild clear nasal drainage with pharyngeal cobblestoning.  TM clear bilaterally.  Mild facial tenderness. CV: RRR Resp: CTABL  Assessment/Plan: Viral URI with likely allergic component.  Will treat symptomatically and will also give nasal steroid given family history of allergies and prominent nasal congestion/sinus pressure symptoms.  Please also see individual problems in problem list for problem-specific plans.

## 2012-10-04 ENCOUNTER — Encounter: Payer: Self-pay | Admitting: Emergency Medicine

## 2012-10-04 ENCOUNTER — Ambulatory Visit (INDEPENDENT_AMBULATORY_CARE_PROVIDER_SITE_OTHER): Payer: Self-pay | Admitting: Emergency Medicine

## 2012-10-04 VITALS — BP 119/69 | HR 74 | Temp 97.7°F | Wt 218.0 lb

## 2012-10-04 DIAGNOSIS — R059 Cough, unspecified: Secondary | ICD-10-CM | POA: Insufficient documentation

## 2012-10-04 DIAGNOSIS — R05 Cough: Secondary | ICD-10-CM | POA: Insufficient documentation

## 2012-10-04 NOTE — Patient Instructions (Addendum)
You probably caught a cold virus and then the salt/spice exposure added to your throat irritation. You are okay to go back to work.  Please wear a mask while you are coughing. Viruses typically last 7-10 days.  The cough can last an extra week or two. Follow up if you are not getting better in the next week or so.

## 2012-10-04 NOTE — Assessment & Plan Note (Addendum)
Likely due to viral URI given concurrent nasal congestion and report of fever.  Non-toxic appearing.  This may be exacerbated by the salt and spices at work.  Continue symptomatic care with OTC agents (cold and cough, sinus congestion).  Also okay to use tylenol and motrin for head pressure.  Okay to return to work, but needs to wear mask while coughing.  Also discussed honey as cough suppressant and nasal saline.  Discussed that cough can linger for weeks to a month.  Follow up if not improving in 1 week.

## 2012-10-04 NOTE — Progress Notes (Signed)
  Subjective:    Patient ID: Derrick Carey, male    DOB: 05-Dec-1990, 21 y.o.   MRN: [id number]  HPI Derrick Carey is here for a SDA for cough.  He reports the cough starting Monday around lunchtime.  That afternoon he was breathing in a lot of spices/salts at work.  He reports some body aches on Monday as well as a fever.  Also has nasal congestion and head pressure.  Eating and drinking well.  Taking OTC cough, allergy and congestion medications.  Wants to know if he can go back to work.  SHx: former smoker   Review of Systems See HPI    Objective:   Physical Exam BP 119/69  Pulse 74  Temp 97.7 F (36.5 C) (Oral)  Wt 218 lb (98.884 kg) Gen: alert, cooperative, NAD, non-toxic HEENT: AT/[place], sclera white, PERRL, MMM, no pharyngeal erythema or exudate, mild cobblestoning, TMs normal bilaterally Neck: supple, no LAD CV: RRR, no murmurs Pulm: CTAB, no wheezes or rales      Assessment & Plan:

## 2013-05-28 ENCOUNTER — Ambulatory Visit: Payer: Self-pay

## 2013-05-30 ENCOUNTER — Ambulatory Visit: Payer: Self-pay

## 2013-07-05 ENCOUNTER — Ambulatory Visit (INDEPENDENT_AMBULATORY_CARE_PROVIDER_SITE_OTHER): Payer: No Typology Code available for payment source | Admitting: Sports Medicine

## 2013-07-05 ENCOUNTER — Encounter: Payer: Self-pay | Admitting: Sports Medicine

## 2013-07-05 VITALS — BP 132/75 | HR 72 | Temp 98.1°F | Ht 71.5 in | Wt 216.0 lb

## 2013-07-05 DIAGNOSIS — R059 Cough, unspecified: Secondary | ICD-10-CM

## 2013-07-05 DIAGNOSIS — R05 Cough: Secondary | ICD-10-CM

## 2013-07-05 DIAGNOSIS — F909 Attention-deficit hyperactivity disorder, unspecified type: Secondary | ICD-10-CM

## 2013-07-05 MED ORDER — METHYLPHENIDATE HCL 10 MG PO TABS: 10.0000 mg | ORAL_TABLET | Freq: Two times a day (BID) | ORAL | Status: DC

## 2013-07-05 MED ORDER — CETIRIZINE HCL 10 MG PO TABS: 10.0000 mg | ORAL_TABLET | Freq: Every day | ORAL | Status: DC

## 2013-07-05 MED ORDER — MONTELUKAST SODIUM 10 MG PO TABS: 10.0000 mg | ORAL_TABLET | Freq: Every day | ORAL | Status: DC

## 2013-07-05 NOTE — Progress Notes (Signed)
[facility] Derrick Carey - 22 y.o. 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 HPI, Interval History & ROS  Pt reports acute problems with: # Cough  Dry hacking cough, non-productive, has been present for months but seems to be worsening   Worsening  aggrivated at work especially while handling food spices  Nothing has helped  No weight loss, no exertional component, non smoker,     Objective Findings   VITALS: HR: 72 bpm  BP: 132/75 mmHg  TEMP: 98.1 F (36.7 C) (Oral)  RESP:   HT: 5' 11.5" (181.6 cm)  WT: 216 lb (97.977 kg)  BMI: Body mass index is 29.71 kg/(m^2).   BP Readings from Last 3 Encounters:  07/05/13 132/75  10/04/12 119/69  07/17/12 98/68   Wt Readings from Last 3 Encounters:  07/05/13 216 lb (97.977 kg)  10/04/12 218 lb (98.884 kg)  07/17/12 216 lb 12.8 oz (98.34 kg)     PHYSICAL EXAM: GENERAL: Adult large framed caucasian young adult  male. In no discomfort; no respiratory distress  PSYCH: alert and appropriate, moderate insight; flattened affect  HNEENT:   CARDIO: RRR, S1/S2 heard, no murmur  LUNGS: CTA B, no wheezes, no crackles; dry cough noted on exam Peak Flows in office.  Performed at expected 750+   ABDOMEN:   EXTREM:  Warm, well perfused.  Moves all 4 extremities spontaneously; no  lateralization. no pretibial edema.  GU:   SKIN:    Medications & Orders   Previous Medications   ESOMEPRAZOLE (NEXIUM) 40 MG CAPSULE    Take 40 mg by mouth daily.     MOMETASONE (NASONEX) 50 MCG/ACT NASAL SPRAY    Place 2 sprays into the nose daily.   Modified Medications   Modified Medication Previous Medication   METHYLPHENIDATE (RITALIN) 10 MG TABLET methylphenidate (RITALIN) 10 MG tablet      Take 1 tablet (10 mg total) by mouth 2 (two) times daily.    Take 1 tablet (10 mg total) by mouth 2 (two) times daily. Fill September 2013   METHYLPHENIDATE (RITALIN) 10 MG TABLET methylphenidate (RITALIN) 10 MG tablet      Take 1 tablet (10 mg total) by mouth 2 (two) times daily.    Take 1 tablet (10 mg total) by mouth 2 (two) times daily.   METHYLPHENIDATE (RITALIN) 10 MG TABLET methylphenidate (RITALIN) 10 MG tablet      Take 1 tablet (10 mg total) by mouth 2 (two) times daily.    Take 1 tablet (10 mg total) by mouth 2 (two) times daily.   New Prescriptions   CETIRIZINE (ZYRTEC) 10 MG TABLET    Take 1 tablet (10 mg total) by mouth daily.   METHYLPHENIDATE (RITALIN) 10 MG TABLET    Take 1 tablet (10 mg total) by mouth 2 (two) times daily.   MONTELUKAST (SINGULAIR)  10 MG TABLET    Take 1 tablet (10 mg total) by mouth at bedtime.   Discontinued Medications   METHYLPHENIDATE (RITALIN) 10 MG TABLET    Take 1 tablet (10 mg total) by mouth 2 (two) times daily.  No orders of the defined types were placed in this encounter.    Assessment & Plan    Problems addressed today: General Instructions:  1. ATTENTION DEFICIT, W/HYPERACTIVITY   2. Cough     Restart Certirizine  Start Singulair to see if helps  Refill on Ritalin  Return precautions provided  Cough is likely due to irritation to the airways from particles at work  Follow Up Issues: > Cough - unclear etiology.  Peak flows good.  Try to treat irritational component.  If not improved consider CXR

## 2013-11-01 ENCOUNTER — Ambulatory Visit (INDEPENDENT_AMBULATORY_CARE_PROVIDER_SITE_OTHER): Payer: No Typology Code available for payment source | Admitting: Sports Medicine

## 2013-11-01 ENCOUNTER — Encounter: Payer: Self-pay | Admitting: Sports Medicine

## 2013-11-01 VITALS — BP 126/68 | Temp 97.9°F | Ht 71.5 in | Wt 221.0 lb

## 2013-11-01 DIAGNOSIS — F909 Attention-deficit hyperactivity disorder, unspecified type: Secondary | ICD-10-CM

## 2013-11-01 MED ORDER — METHYLPHENIDATE HCL 10 MG PO TABS: 10.0000 mg | ORAL_TABLET | Freq: Two times a day (BID) | ORAL | Status: DC

## 2013-11-01 MED ORDER — METHYLPHENIDATE HCL 10 MG PO TABS
10.0000 mg | ORAL_TABLET | Freq: Two times a day (BID) | ORAL | Status: DC
Start: 2013-11-01 — End: 2015-07-23

## 2013-11-01 NOTE — Progress Notes (Signed)
  Derrick Carey C Macke - 23 y.o. male MRN [id number]  Date of birth: May 18, 1991  CC, HPI, Interval History & ROS  Derrick Carey is here today to followup on his chronic medical conditions including:  ADHD   He reports doing well.  No problems with meds.  No weight loss; no tachypalpitations  Effective dose  Other acute problems include:   Pertinent History & Care Coordination   Jarad's major active medical problems include: # ADHD: Dx in 3rd Grade - Improved function on Ritalin # Cough/Seasonal Allergies: Long standing   Other Care Providers include:  None  Brief Social History:  Lives with parents  Psychiatric nurseactory worker - [employer]  Non smoker  Other Pertinent Med/Surg/Hosp History: #  None Follow up Issues:     History  Smoking status  . Former Smoker -- 0.50 packs/day for 1 years  . Types: Cigarettes  Smokeless tobacco  . Never Used   Health Maintenance Due  Topic  . Influenza Vaccine    No results found for this basename: HGBA1C, TRIG, CHOL, HDL, LDLCALC, TSH,  in the last 8760 hours   Otherwise past Medical, Surgical, Social, and Family History Reviewed per EMR Medications and Allergies reviewed and all updated if necessary. Objective Findings  VITALS: HR:   bpm  BP: 126/68 mmHg  TEMP: 97.9 F (36.6 C) (Oral)  RESP:    HT: 5' 11.5" (181.6 cm)  WT: 221 lb (100.245 kg)  BMI: 30.5   BP Readings from Last 3 Encounters:  11/01/13 126/68  07/05/13 132/75  10/04/12 119/69   Wt Readings from Last 3 Encounters:  11/01/13 221 lb (100.245 kg)  07/05/13 216 lb (97.977 kg)  10/04/12 218 lb (98.884 kg)     PHYSICAL EXAM: GENERAL: Young adult caucasian  male. In no discomfort; no respiratory distress  PSYCH: alert and appropriate, good insight      Assessment & Plan   Problems addressed today: General Plan & Pt Instructions:  1. ATTENTION DEFICIT, W/HYPERACTIVITY       Refilled meds  Flu shot declined      For further discussion of A/P and for  follow up issues see problem based charting.

## 2013-11-01 NOTE — Assessment & Plan Note (Addendum)
Refilled meds.  Tolerating meds well without difficulty F/u 4 months

## 2013-11-26 ENCOUNTER — Ambulatory Visit (INDEPENDENT_AMBULATORY_CARE_PROVIDER_SITE_OTHER): Payer: No Typology Code available for payment source | Admitting: Emergency Medicine

## 2013-11-26 VITALS — BP 121/82 | HR 74 | Temp 98.1°F | Wt 221.9 lb

## 2013-11-26 DIAGNOSIS — J0100 Acute maxillary sinusitis, unspecified: Secondary | ICD-10-CM

## 2013-11-26 DIAGNOSIS — J0110 Acute frontal sinusitis, unspecified: Secondary | ICD-10-CM | POA: Insufficient documentation

## 2013-11-26 DIAGNOSIS — J019 Acute sinusitis, unspecified: Secondary | ICD-10-CM

## 2013-11-26 HISTORY — DX: Acute maxillary sinusitis, unspecified: J01.00

## 2013-11-26 MED ORDER — AMOXICILLIN-POT CLAVULANATE 875-125 MG PO TABS: 1.0000 | ORAL_TABLET | Freq: Two times a day (BID) | ORAL | Status: DC

## 2013-11-26 NOTE — Assessment & Plan Note (Signed)
Consistent with history and exam. Augmentin BID x10 days. Discussed nasal saline spray as well. F/u in 1 week if not improving.

## 2013-11-26 NOTE — Patient Instructions (Signed)
It was nice to see you!  You have a sinus infection. Take augmentin 1 pill twice a day for 10 days. Make sure your are using saline nasal spray 3-4 times a day to wash out the sinuses.  F/u is not improving in 1 week.

## 2013-11-26 NOTE — Progress Notes (Signed)
   Subjective:    Patient ID: Derrick Carey, male    DOB: 02/20/1991, 23 y.o.   MRN: [id number]  HPI Derrick Brightlydam C Taras is here for a SDA for sinus headache.  He reports sinus congestion and headache starting last Wednesday.  Initially had a fever, but not in the last few days.  Associated with post nasal drip, but denies rhinorrhea or much nasal congestion.  Associated with sore throat and mild cough.  Reports some fatigue initially, but this is improving.  Current Outpatient Prescriptions on File Prior to Visit  Medication Sig Dispense Refill  . cetirizine (ZYRTEC) 10 MG tablet Take 1 tablet (10 mg total) by mouth daily.  30 tablet  11  . esomeprazole (NEXIUM) 40 MG capsule Take 40 mg by mouth daily.        . methylphenidate (RITALIN) 10 MG tablet Take 1 tablet (10 mg total) by mouth 2 (two) times daily.  60 tablet  0  . methylphenidate (RITALIN) 10 MG tablet Take 1 tablet (10 mg total) by mouth 2 (two) times daily.  60 tablet  0  . methylphenidate (RITALIN) 10 MG tablet Take 1 tablet (10 mg total) by mouth 2 (two) times daily.  60 tablet  0  . methylphenidate (RITALIN) 10 MG tablet Take 1 tablet (10 mg total) by mouth 2 (two) times daily.  60 tablet  0  . mometasone (NASONEX) 50 MCG/ACT nasal spray Place 2 sprays into the nose daily.  17 g  12  . montelukast (SINGULAIR) 10 MG tablet Take 1 tablet (10 mg total) by mouth at bedtime.  30 tablet  3   No current facility-administered medications on file prior to visit.    I have reviewed and updated the following as appropriate: allergies and current medications SHx: non smoker   Review of Systems See HPI    Objective:   Physical Exam BP 121/82  Pulse 74  Temp(Src) 98.1 F (36.7 C) (Oral)  Wt 221 lb 14.4 oz (100.653 kg) Gen: alert, cooperative, NAD HEENT: AT/[place], sclera white, MMM, mild cobblestoning, no pharyngeal exudates, +right maxillary and bilateral frontal sinus tenderness; nasal mucosa slightly boggy Neck: supple, no LAD CV:  RRR, no murmurs Pulm: CTAB, no wheezes or rales      Assessment & Plan:

## 2014-04-18 ENCOUNTER — Ambulatory Visit (INDEPENDENT_AMBULATORY_CARE_PROVIDER_SITE_OTHER): Payer: No Typology Code available for payment source | Admitting: Family Medicine

## 2014-04-18 ENCOUNTER — Encounter: Payer: Self-pay | Admitting: Family Medicine

## 2014-04-18 VITALS — BP 120/82 | HR 73 | Temp 98.3°F | Ht 71.5 in | Wt 228.0 lb

## 2014-04-18 DIAGNOSIS — R059 Cough, unspecified: Secondary | ICD-10-CM

## 2014-04-18 DIAGNOSIS — R05 Cough: Secondary | ICD-10-CM

## 2014-04-18 MED ORDER — CETIRIZINE HCL 10 MG PO TABS
10.0000 mg | ORAL_TABLET | Freq: Every day | ORAL | Status: DC
Start: 2014-04-18 — End: 2014-04-18

## 2014-04-18 MED ORDER — CETIRIZINE HCL 10 MG PO TABS: 10.0000 mg | ORAL_TABLET | Freq: Every day | ORAL | Status: DC

## 2014-04-18 MED ORDER — MOMETASONE FUROATE 50 MCG/ACT NA SUSP: 2.0000 | Freq: Every day | NASAL | Status: DC

## 2014-04-18 MED ORDER — MOMETASONE FUROATE 50 MCG/ACT NA SUSP: 4.0000 | Freq: Every day | NASAL | Status: DC

## 2014-04-18 NOTE — Progress Notes (Signed)
   Derrick Carey is a 23 y.o. male who [facility].  HPI: Cough started 4 days ago. Patient reports a several month history of coughing every now and then, which he mainly attributes to his work environment. Has tried dayquil and nyquil with minimal relief of his cough. Has some clear drainage from his nose. Said he felt like he had a fever 4 days ago, but did not check his temperature. No fevers or chills since then. Minimal, 1/10, periorbital pain. Denies purulent discharge. Also reports having a sore throat. Symptoms are worsening. Cough is worse at night. His uncle recently has similar symptoms. No new exposures that he can think of, though does say he spent some time with "a whole bunch of puppies" and says that he used to be allergic to dogs, but is no longer allergic.     Past Medical History  Diagnosis Date  . GERD (gastroesophageal reflux disease)   . ADHD (attention deficit hyperactivity disorder)   . Asperger syndrome     ROS as above.    Medications reviewed. Current Outpatient Prescriptions  Medication Sig Dispense Refill  . amoxicillin-clavulanate (AUGMENTIN) 875-125 MG per tablet Take 1 tablet by mouth 2 (two) times daily.  20 tablet  0  . cetirizine (ZYRTEC) 10 MG tablet Take 1 tablet (10 mg total) by mouth daily.  30 tablet  11  . esomeprazole (NEXIUM) 40 MG capsule Take 40 mg by mouth daily.        . methylphenidate (RITALIN) 10 MG tablet Take 1 tablet (10 mg total) by mouth 2 (two) times daily.  60 tablet  0  . methylphenidate (RITALIN) 10 MG tablet Take 1 tablet (10 mg total) by mouth 2 (two) times daily.  60 tablet  0  . methylphenidate (RITALIN) 10 MG tablet Take 1 tablet (10 mg total) by mouth 2 (two) times daily.  60 tablet  0  . methylphenidate (RITALIN) 10 MG tablet Take 1 tablet (10 mg total) by mouth 2 (two) times daily.  60 tablet  0  . mometasone (NASONEX) 50 MCG/ACT nasal spray Place 2 sprays into the nose daily.  17 g  12  . montelukast  (SINGULAIR) 10 MG tablet Take 1 tablet (10 mg total) by mouth at bedtime.  30 tablet  3   No current facility-administered medications for this visit.    Exam:  BP 120/82  Pulse 73  Temp(Src) 98.3 F (36.8 C) (Oral)  Ht 5' 11.5" (1.816 m)  Wt 228 lb (103.42 kg)  BMI 31.36 kg/m2  SpO2 96% Gen: NAD HEENT: EOMI,  MMM, O/P with mild erythema and no purulence. Some tenderness to palpation along maxillary sinuses bilaterally Lungs: CTABL Nl WOB, no wheezes or crackles. Heart: RRR no MRG Abd: NABS, NT, ND Exts: Non edematous BL  LE, warm and well perfused.   No results found for this or any previous visit (from the past 72 hour(s)).  A/P: See problem list  No problem-specific assessment & plan notes found for this encounter.   Katina Degreealeb M. Jimmey RalphParker, MD [facility] Resident PGY-1 04/18/2014 2:06 PM

## 2014-04-18 NOTE — Patient Instructions (Signed)
Thank you for coming to the clinic today. Your symptoms are most likely due to a viral infection. I have prescribed Zyrtec and Nasonex. You can also try taking over the counter ibuprofen, and saline nasal spray to see if it helps.  Please call if you are not feeling better within a week.

## 2014-04-18 NOTE — Assessment & Plan Note (Addendum)
Patient's symptoms today most consistent with viral URI. No signs of bacterial infection. Less likely allergies as patient has no known environmental allergies and reports no new exposures. Benign exam. Plan to treat symptoms with ceterizine and nasonex. Also recommended OTC ibuprofen and nasal saline to help relieve symptoms.

## 2015-06-05 ENCOUNTER — Ambulatory Visit (INDEPENDENT_AMBULATORY_CARE_PROVIDER_SITE_OTHER): Payer: Self-pay | Admitting: Family Medicine

## 2015-06-05 ENCOUNTER — Encounter: Payer: Self-pay | Admitting: Family Medicine

## 2015-06-05 VITALS — BP 127/80 | HR 72 | Temp 98.5°F | Ht 71.5 in | Wt 245.6 lb

## 2015-06-05 DIAGNOSIS — J309 Allergic rhinitis, unspecified: Secondary | ICD-10-CM | POA: Insufficient documentation

## 2015-06-05 DIAGNOSIS — R059 Cough, unspecified: Secondary | ICD-10-CM

## 2015-06-05 DIAGNOSIS — R05 Cough: Secondary | ICD-10-CM

## 2015-06-05 DIAGNOSIS — J3089 Other allergic rhinitis: Secondary | ICD-10-CM

## 2015-06-05 MED ORDER — CETIRIZINE HCL 10 MG PO TABS: 10.0000 mg | ORAL_TABLET | Freq: Every day | ORAL | Status: DC

## 2015-06-05 MED ORDER — FLUTICASONE PROPIONATE 50 MCG/ACT NA SUSP: 2.0000 | Freq: Every day | NASAL | Status: DC

## 2015-06-05 NOTE — Progress Notes (Signed)
Subjective: LILLIAN TIGGES is a 24 y.o. male presenting for nasal congestion and cough.   He and his mother report that this is the 3rd day of yellow and occasionally rust-colored mucus postnasal drip which he spits up. + Cough without red blood. Feels like he had a fever yesterday but not today. None measured. No N/V/D/abd pain, HA, vision changes, CP, SOB, wheezing. Sudafed hasn't helped and made him dizzy. Tried benadryl without much improvement. Feels well hydrated. Just moved to new home with carpet and has a history of allergies worse in the summer time. No ear drainage, pain, fullness.   - Denies current tobacco use, but has smoked in the past. - Denies environmental exposures to allergens/irritants.  Objective: BP 127/80 mmHg  Pulse 72  Temp(Src) 98.5 F (36.9 C) (Oral)  Ht 5' 11.5" (1.816 m)  Wt 245 lb 9.6 oz (111.403 kg)  BMI 33.78 kg/m2 Gen: Generally well-appearing, obese 24 y.o. male in no distress HEENT: Normocephalic, sclerae clear, conjunctivae normal, pupils equal and reactive. Slightly deviated septum, nares with clear rhinorrhea and boggy turbinates. TMs and ear canals normal. Moist mucous membranes, posterior oropharynx clear with bilateral mild tonsilomegaly. Poor dentition CV: Regular rate, no murmur; cap refill < 2 sec. Pulm: Non-labored breathing ambient air; CTAB, no wheezes or crackles.  Assessment/Plan: HEBER HOOG is a 24 y.o. male here for allergic rhinitis.  See problem list for plan.

## 2015-06-05 NOTE — Assessment & Plan Note (Signed)
Given abbreviated course to date, we will treat symptoms with nasal saline and efforts at decreasing nasal inflammation with flonase and zyrtec. Urged to return if symptoms change, he gets a fever or dyspnea, or if symptoms last 10 days or more at which time I would consider acute sinusitis, which he has been treated for in the past.

## 2015-06-05 NOTE — Patient Instructions (Signed)
Use zyrtec, flonase, and nasal saline everyday. If you start having fevers or this lasts > 10 days, return to the clinic to be evaluated for a possible secondary infection.

## 2015-06-06 ENCOUNTER — Other Ambulatory Visit: Payer: Self-pay | Admitting: Family Medicine

## 2015-06-06 NOTE — Telephone Encounter (Signed)
Pt seen yesterday and forgot to ask for his refill for ritalin, would like rx asap

## 2015-06-09 NOTE — Telephone Encounter (Signed)
We didn't address this at all. He needs another office visit for this.

## 2015-06-09 NOTE — Telephone Encounter (Signed)
Contacted pt to inform him of below and his mom got on the phone and stated that he really needed the medicine and I told her that Dr. Jarvis Newcomer didn't have any openings until 8/25 or he could come in on Monday 8/29.  She asked if he had anything this afternoon and I told her that I would check but that his clinic was full.  Dr. Jarvis Newcomer was unable to work him in today so pt chose to come in on 8/29 on PCP Same Day schedule due to his next regular schedule being so far out.  Pt was notified that we were unable to work him in and reminded that his appointment is on Monday 8/29 @ 8:30. Derrick Carey, Derrick Carey D, [name]

## 2015-06-16 ENCOUNTER — Ambulatory Visit: Payer: Self-pay | Admitting: Family Medicine

## 2015-06-16 ENCOUNTER — Other Ambulatory Visit: Payer: Self-pay | Admitting: Family Medicine

## 2015-06-16 NOTE — Telephone Encounter (Signed)
Please have patient schedule an office visit to discuss his ADHD. He no-showed to his appointment this morning.

## 2015-06-16 NOTE — Telephone Encounter (Signed)
Pt would like a refill on his Ritalin. Sadie Reynolds, ASA

## 2015-06-16 NOTE — Telephone Encounter (Signed)
Called mom's number she gave me [phone] for [place].  Called, no answer, no voicemail set up. Sunday Spillers, CMA

## 2015-06-19 ENCOUNTER — Encounter: Payer: Self-pay | Admitting: Family Medicine

## 2015-06-19 ENCOUNTER — Ambulatory Visit (INDEPENDENT_AMBULATORY_CARE_PROVIDER_SITE_OTHER): Payer: Self-pay | Admitting: Family Medicine

## 2015-06-19 VITALS — BP 143/80 | HR 94 | Temp 98.3°F | Ht 71.5 in | Wt 252.7 lb

## 2015-06-19 DIAGNOSIS — R238 Other skin changes: Secondary | ICD-10-CM

## 2015-06-19 DIAGNOSIS — J3089 Other allergic rhinitis: Secondary | ICD-10-CM

## 2015-06-19 DIAGNOSIS — J0100 Acute maxillary sinusitis, unspecified: Secondary | ICD-10-CM

## 2015-06-19 HISTORY — DX: Other skin changes: R23.8

## 2015-06-19 MED ORDER — AMOXICILLIN-POT CLAVULANATE 875-125 MG PO TABS: 1.0000 | ORAL_TABLET | Freq: Two times a day (BID) | ORAL | Status: DC

## 2015-06-19 NOTE — Patient Instructions (Signed)
Dear Derrick Carey, Thank you for coming in to clinic today.  1. You most likely have a Sinusitis - this is probably from bacterial infection after your previous virus and/or allergies. 2. Take Augmentin antibiotic 1 pill twice daily with food for next 10 days (complete the entire course, even if you start to feel better). 3. Continue using Flonase nasal spray, Saline nasal spray (several times a day for congestion), and Zyrtec (generic one) 4. Your rash on leg looks like it may be Molluscoum Contagiosum (may be a viral rash, and these can take a while to go away, alternatively it could be some skin warts, again a virus that may need over the counter or rx treatment). Follow-up in future with your PCP, may need to see a Dermatologist   Please schedule a follow-up appointment with Dr. Jarvis Newcomer in 2 weeks if not improving.  If you have any other questions or concerns, please feel free to call the clinic to contact me. You may also schedule an earlier appointment if necessary.  However, if your symptoms get significantly worse, please go to the Emergency Department to seek immediate medical attention.  Saralyn Pilar, DO [facility]

## 2015-06-19 NOTE — Assessment & Plan Note (Signed)
Consistent with subacute rhinosinusitis, likely initially viral URI / allergy etiology, initial improvement now worsening sinus pain/pressure x 2 weeks, +congestion, other general malaise and constitutional symptoms but afebrile.  Plan: 1. Start Augmentin 875-125mg  PO BID x 10 days (#20, 0 refills) sent to pharmacy 2. Continue Flonase, Zyrtec, Singulair. 3. Supportive care with nasal saline OTC, hydration, warm compresses 4. Return criteria reviewed

## 2015-06-19 NOTE — Assessment & Plan Note (Signed)
Consistent with existing dx allergic rhinitis, continue current allergy treatments. Additionally treating for likely bacterial sinusitis.

## 2015-06-19 NOTE — Assessment & Plan Note (Signed)
Consistent with cluster of small 1x46mm raised papule soft blanchable papules only on anterior aspect of left lower ext lateral and below knee, some have coalesced. Present x years, increasing #. Consider may be molluscum contagiosum (looks similar but not clear umbilication) vs other viral skin etiology, may be flat warts. No sign of infection or complication. - No prior treatments  Plan: 1. Recommend monitoring and re-evaluation in future as this was not primary complaint today in SDA. Would be good candidate for [facility] referral, may do trial cryotherapy

## 2015-06-19 NOTE — Telephone Encounter (Signed)
Pt has appointment scheduled on October 5th. Lamonte Sakai, April D, [name]

## 2015-06-19 NOTE — Progress Notes (Addendum)
Subjective:    Patient ID: Derrick Carey, male    DOB: 1991-03-18, 24 y.o.   MRN: [id number]  Derrick Carey is a 24 y.o. male presenting on 06/19/2015 for Allergies  Patient presents for a same day appointment.  HPI  SINUSITIS / ALLERGIC RHINITIS: - Last OV 8/18 with Dr. Jarvis Newcomer with diagnosis of allergic rhinitis and possible sinusitis, with constellation of URI symptoms, given Flonase and Zyrtec, follow-up advised. - Today patient returns for follow-up for same complaints. Stated that overall symptoms have been persistent to worse in past 2+ weeks with sinus pain, pressure, congestion, and some ear pressure without pain. Additionally with non-productive cough. Additional history with reported generalized malaise and some aches in arms/legs and joints (elbows, knees, neck, back), difficulty   RASH LEFT LOWER EXT: - Additionally reports raised red rash lesions over anterior left lower leg below knee, stated he has had several small "bumps" here since she was a "teen" and over several years. He has not received any treatment for these, not put on any topicals. Has been advised that they are likely skin tags. However, now seems to have more of them. Describes some pain below his knee at the site of one, otherwise no bleeding, drainage, or other symptoms.  Past Medical History  Diagnosis Date  . GERD (gastroesophageal reflux disease)   . ADHD (attention deficit hyperactivity disorder)   . Asperger syndrome     Social History   Social History  . Marital Status: Single    Spouse Name: N/A  . Number of Children: N/A  . Years of Education: N/A   Occupational History  . Not on file.   Social History Main Topics  . Smoking status: Former Smoker -- 0.50 packs/day for 1 years    Types: Cigarettes  . Smokeless tobacco: Never Used  . Alcohol Use: Not on file  . Drug Use: Not on file  . Sexual Activity: Not on file   Other Topics Concern  . Not on file   Social History Narrative   [employer] -    Lives at home with Mom and Dad          Current Outpatient Prescriptions on File Prior to Visit  Medication Sig  . cetirizine (ZYRTEC) 10 MG tablet Take 1 tablet (10 mg total) by mouth daily.  Marland Kitchen esomeprazole (NEXIUM) 40 MG capsule Take 40 mg by mouth daily.    . fluticasone (FLONASE) 50 MCG/ACT nasal spray Place 2 sprays into both nostrils daily.  . methylphenidate (RITALIN) 10 MG tablet Take 1 tablet (10 mg total) by mouth 2 (two) times daily.  . methylphenidate (RITALIN) 10 MG tablet Take 1 tablet (10 mg total) by mouth 2 (two) times daily.  . methylphenidate (RITALIN) 10 MG tablet Take 1 tablet (10 mg total) by mouth 2 (two) times daily.  . methylphenidate (RITALIN) 10 MG tablet Take 1 tablet (10 mg total) by mouth 2 (two) times daily.  . montelukast (SINGULAIR) 10 MG tablet Take 1 tablet (10 mg total) by mouth at bedtime.   No current facility-administered medications on file prior to visit.    Review of Systems  Constitutional: Negative for fever, chills, diaphoresis, activity change, appetite change and fatigue.  HENT: Positive for postnasal drip, rhinorrhea, sinus pressure, sneezing and sore throat (sore with coughing). Negative for congestion, ear discharge, ear pain, facial swelling and hearing loss.   Eyes: Negative for photophobia, pain, redness, itching and visual disturbance.  Respiratory: Positive for cough.  Negative for chest tightness, shortness of breath and wheezing.   Cardiovascular: Negative for chest pain, palpitations and leg swelling.  Gastrointestinal: Negative for nausea, vomiting, abdominal pain, diarrhea and constipation.  Genitourinary: Negative for dysuria, frequency, hematuria and flank pain.  Musculoskeletal: Positive for myalgias and arthralgias (elbows, knees, back, neck). Negative for joint swelling, gait problem and neck pain.  Skin: Positive for rash (left lower leg small raised bumps).  Neurological: Positive for dizziness  (within past 48 hours, intermittent). Negative for weakness, light-headedness, numbness and headaches.  Hematological: Negative for adenopathy.  Psychiatric/Behavioral: Negative for behavioral problems and confusion.   Per HPI unless specifically indicated above     Objective:    BP 143/80 mmHg  Pulse 94  Temp(Src) 98.3 F (36.8 C) (Oral)  Ht 5' 11.5" (1.816 m)  Wt 252 lb 11.2 oz (114.624 kg)  BMI 34.76 kg/m2  Wt Readings from Last 3 Encounters:  06/19/15 252 lb 11.2 oz (114.624 kg)  06/05/15 245 lb 9.6 oz (111.403 kg)  04/18/14 228 lb (103.42 kg)    Physical Exam  Constitutional: He is oriented to person, place, and time. He appears well-developed and well-nourished. No distress.  HENT:  Head: Normocephalic and atraumatic.  Right Ear: External ear normal.  Left Ear: External ear normal.  Mouth/Throat: Oropharynx is clear and moist. No oropharyngeal exudate.  Sinus +TTP over bilateral maxillary sinuses, frontal minimally tender. Nares patent but turbinates edematous with +congestion. Oropharynx mostly clear with mild postnasal drip and erythema but no exudates or localized edema, or asymmetry.  Eyes: Conjunctivae and EOM are normal. Pupils are equal, round, and reactive to light. Right eye exhibits no discharge. Left eye exhibits no discharge.  Neck: Normal range of motion. Neck supple. No thyromegaly present.  Cardiovascular: Normal rate, regular rhythm, normal heart sounds and intact distal pulses.   No murmur heard. Pulmonary/Chest: Effort normal and breath sounds normal. No respiratory distress. He has no wheezes. He has no rales. He exhibits no tenderness.  Musculoskeletal: Normal range of motion. He exhibits no edema or tenderness.  Lymphadenopathy:    He has no cervical adenopathy (mild left side neck tenderness to palpation).  Neurological: He is alert and oriented to person, place, and time. He exhibits normal muscle tone.  Skin: Skin is warm and dry. Rash (left  anterior lower leg, small 1x70mm erythematous raised papules, +blanching, non-tender, no drainage, some in a cluster, scattered within this localized region) noted. He is not diaphoretic.  Nursing note and vitals reviewed.  Results for orders placed or performed in visit on 12/06/07  Converted [facility] Lab  Result Value Ref Range   Rapid Strep negative       Assessment & Plan:   Problem List Items Addressed This Visit      Respiratory   Acute sinusitis - Primary    Consistent with subacute rhinosinusitis, likely initially viral URI / allergy etiology, initial improvement now worsening sinus pain/pressure x 2 weeks, +congestion, other general malaise and constitutional symptoms but afebrile.  Plan: 1. Start Augmentin 875-125mg  PO BID x 10 days (#20, 0 refills) sent to pharmacy 2. Continue Flonase, Zyrtec, Singulair. 3. Supportive care with nasal saline OTC, hydration, warm compresses 4. Return criteria reviewed      Relevant Medications   amoxicillin-clavulanate (AUGMENTIN) 875-125 MG per tablet   Allergic rhinitis    Consistent with existing dx allergic rhinitis, continue current allergy treatments. Additionally treating for likely bacterial sinusitis.        Other   Papule of  skin    Consistent with cluster of small 1x47mm raised papule soft blanchable papules only on anterior aspect of left lower ext lateral and below knee, some have coalesced. Present x years, increasing #. Consider may be molluscum contagiosum (looks similar but not clear umbilication) vs other viral skin etiology, may be flat warts. No sign of infection or complication. - No prior treatments  Plan: 1. Recommend monitoring and re-evaluation in future as this was not primary complaint today in SDA. Would be good candidate for [facility] referral, may do trial cryotherapy         Meds ordered this encounter  Medications  . amoxicillin-clavulanate (AUGMENTIN) 875-125 MG per tablet    Sig: Take 1  tablet by mouth 2 (two) times daily.    Dispense:  20 tablet    Refill:  0      Follow up plan: Return in about 2 weeks (around 07/03/2015), or if symptoms worsen or fail to improve, for sinusitis.  Saralyn Pilar, DO [facility], PGY-3

## 2015-07-23 ENCOUNTER — Ambulatory Visit (INDEPENDENT_AMBULATORY_CARE_PROVIDER_SITE_OTHER): Payer: Self-pay | Admitting: Family Medicine

## 2015-07-23 ENCOUNTER — Encounter: Payer: Self-pay | Admitting: Family Medicine

## 2015-07-23 VITALS — BP 122/75 | HR 75 | Temp 97.8°F | Ht 72.0 in | Wt 247.6 lb

## 2015-07-23 DIAGNOSIS — F902 Attention-deficit hyperactivity disorder, combined type: Secondary | ICD-10-CM

## 2015-07-23 DIAGNOSIS — F17200 Nicotine dependence, unspecified, uncomplicated: Secondary | ICD-10-CM

## 2015-07-23 DIAGNOSIS — E669 Obesity, unspecified: Secondary | ICD-10-CM

## 2015-07-23 LAB — CBC
HCT: 47.9 % (ref 39.0–52.0)
Hemoglobin: 16.4 g/dL (ref 13.0–17.0)
MCH: 29.4 pg (ref 26.0–34.0)
MCHC: 34.2 g/dL (ref 30.0–36.0)
MCV: 86 fL (ref 78.0–100.0)
MPV: 12.8 fL — ABNORMAL HIGH (ref 8.6–12.4)
PLATELETS: 189 10*3/uL (ref 150–400)
RBC: 5.57 MIL/uL (ref 4.22–5.81)
RDW: 13.2 % (ref 11.5–15.5)
WBC: 8.5 10*3/uL (ref 4.0–10.5)

## 2015-07-23 LAB — BASIC METABOLIC PANEL WITH GFR
BUN: 9 mg/dL (ref 7–25)
CALCIUM: 8.9 mg/dL (ref 8.6–10.3)
CHLORIDE: 101 mmol/L (ref 98–110)
CO2: 25 mmol/L (ref 20–31)
CREATININE: 0.97 mg/dL (ref 0.60–1.35)
GFR, Est Non African American: >89 mL/min (ref >=60)
Glucose, Bld: 84 mg/dL (ref 65–99)
Potassium: 4.3 mmol/L (ref 3.5–5.3)
SODIUM: 136 mmol/L (ref 135–146)

## 2015-07-23 LAB — TSH: TSH: 1.261 u[IU]/mL (ref 0.350–4.500)

## 2015-07-23 MED ORDER — METHYLPHENIDATE HCL 10 MG PO TABS: 10.0000 mg | ORAL_TABLET | Freq: Two times a day (BID) | ORAL | Status: DC

## 2015-07-23 NOTE — Progress Notes (Addendum)
Subjective: Derrick Carey is a 24 y.o. male with a history of ADHD here for follow up.  He was diagnosed with ADHD at the age of 3 or 4 and started medications at that time, ritalin then concerta, then adderall. Last med was methylphenidate for several months. Father reports he has been harder to keep focused, constantly forgetting things, trouble keeping correct sleep-wake cycle which impacts his work. Symptoms are reported both at home and work and it was also prevalent at school. Denies weight loss, insomnia, palpitations.   He demonstrates impulsivity (can't wait his turn in line), hyperactivity (can't remain seated, runs/climbs inappropriately), inattention (has poor attention to details, listens poorly, avoids tasks requiring substantial mental effort, loses things, often easily distracted).   - SH: Currently finished with high school and some college for a certificate operating on computers. Repeated a grade, either 10th or 11th. Lives with parents in [place]. Has no known history of lead exposure. + tobacco use, declines interventions. No EtOH or drugs.  - PMH: +depression, anxiety, OCD, tobacco use, substance abuse.  - FH: +tobacco use.   Objective BP 122/75 mmHg  Pulse 75  Temp(Src) 97.8 F (36.6 C) (Oral)  Ht 6' (1.829 m)  Wt 247 lb 9.6 oz (112.311 kg)  BMI 33.57 kg/m2 Gen:  24 y.o. male in NAD Neuro: Alert and oriented, speech normal Psych: Behavior is restless/fidgety, friendly, interactive  Assessment/Plan: Derrick Carey is a 24 y.o. male here for ADHD follow up.  - Symptoms uncontrolled for the past months while off of medications. Will restart previous lower dose of ritalin and follow up in 1 month.

## 2015-07-23 NOTE — Patient Instructions (Signed)
Thank you for coming in today!  - Let's start methylphenidate and follow up in 1 month.  - I'll let you know if there is any abnormality on your labs.   Our clinic's number is [phone]. Feel free to call any time with questions or concerns. We will answer any questions after hours with our 24-hour emergency line at that number as well.   - Dr. Jarvis Newcomer

## 2015-07-23 NOTE — Assessment & Plan Note (Signed)
Symptoms uncontrolled in multiple environments while off medications, will restart at low dose, f/u 1 month.

## 2015-07-23 NOTE — Assessment & Plan Note (Signed)
Declines intervention.

## 2015-07-23 NOTE — Assessment & Plan Note (Signed)
BMI 33, advised on lifestyle modifications.

## 2015-08-25 ENCOUNTER — Encounter (HOSPITAL_BASED_OUTPATIENT_CLINIC_OR_DEPARTMENT_OTHER): Payer: Self-pay | Admitting: Emergency Medicine

## 2015-08-25 ENCOUNTER — Emergency Department (HOSPITAL_BASED_OUTPATIENT_CLINIC_OR_DEPARTMENT_OTHER)
Admission: EM | Admit: 2015-08-25 | Discharge: 2015-08-25 | Disposition: A | Discharge status: Home / Self Care | Payer: Self-pay | Attending: Emergency Medicine | Admitting: Emergency Medicine

## 2015-08-25 ENCOUNTER — Ambulatory Visit: Payer: Self-pay | Admitting: Family Medicine

## 2015-08-25 DIAGNOSIS — K0889 Other specified disorders of teeth and supporting structures: Secondary | ICD-10-CM | POA: Insufficient documentation

## 2015-08-25 DIAGNOSIS — K029 Dental caries, unspecified: Secondary | ICD-10-CM | POA: Insufficient documentation

## 2015-08-25 DIAGNOSIS — Z7951 Long term (current) use of inhaled steroids: Secondary | ICD-10-CM | POA: Insufficient documentation

## 2015-08-25 DIAGNOSIS — K219 Gastro-esophageal reflux disease without esophagitis: Secondary | ICD-10-CM | POA: Insufficient documentation

## 2015-08-25 DIAGNOSIS — Z87891 Personal history of nicotine dependence: Secondary | ICD-10-CM | POA: Insufficient documentation

## 2015-08-25 DIAGNOSIS — F909 Attention-deficit hyperactivity disorder, unspecified type: Secondary | ICD-10-CM | POA: Insufficient documentation

## 2015-08-25 DIAGNOSIS — Z79899 Other long term (current) drug therapy: Secondary | ICD-10-CM | POA: Insufficient documentation

## 2015-08-25 MED ORDER — IBUPROFEN 800 MG PO TABS: 800.0000 mg | ORAL_TABLET | Freq: Three times a day (TID) | ORAL | Status: DC

## 2015-08-25 MED ORDER — PENICILLIN V POTASSIUM 500 MG PO TABS: 500.0000 mg | ORAL_TABLET | Freq: Two times a day (BID) | ORAL | Status: AC

## 2015-08-25 NOTE — Discharge Instructions (Signed)
Take your medications as prescribed. You may also continue using ice for pain relief. Follow-up with a dentist from one of the resources provided. Return to the emergency department if symptoms worsen or new onset of fever, difficulty breathing, throat swelling, chest pain, difficulty swallowing.  [facility] [employer] [facility] [address] [place], [place] [address] Phone [phone]  The [employer] [facility] [place], exemplifies the [employer] vision to improve the health and quality of life of all [place] by Public house managercreating leaders with a passion to care for the underserved and by leading the nation in community-based, service learning oral health education.  We are committed to offering comprehensive general dental services for adults, children and special needs patients in a safe, caring and professional setting.   Appointments: Our clinic is open Monday through Friday 8:00 a.m. until 5:00 p.m. The amount of time scheduled for an appointment depends on the patients specific needs. We ask that you keep your appointed time for care or provide 24-hour notice of all appointment changes. Parents or legal guardians must accompany minor children.   Payment for Services: Medicaid and other insurance plans are welcome. Payment for services is due when services are rendered and may be made by cash or credit card. If you have dental insurance, we will assist you with your claim submission.    Emergencies:  Emergency services will be provided Monday through Friday on a walk-in basis.  Please arrive early for emergency services. After hours emergency services will be provided for patients of record as required.   Services:  Armed forces operational officerComprehensive General Dentistry Childrens Dentistry Oral Surgery - Extractions Root Canals Sealants and  Tooth Colored Fillings Crowns and Bridges Dentures and Partial Dentures Implant Services Periodontal Services and Cleanings Cosmetic Armed forces operational officerTooth Whitening Digital Radiography 3-D/[facility]

## 2015-08-25 NOTE — ED Notes (Signed)
Pt having dental pain to left upper area since last night.  Pt has numerous rotten teeth and cavities.

## 2015-08-25 NOTE — ED Provider Notes (Signed)
CSN: [id number]     Arrival date & time 08/25/15  1008 History   First MD Initiated Contact with Patient 08/25/15 1117     Chief Complaint  Patient presents with  . Dental Pain     (Consider location/radiation/quality/duration/timing/severity/associated sxs/prior Treatment) HPI Comments: Patient is a 24 year old male who presents to the ED with complaint of left upper dental pain, onset last night. Patient reports having pain and associated facial swelling to his left upper teeth/molars. Denies drainage, fever, difficulty breathing, difficulty swallowing. He notes last night he brushed his gums multiple times to try to make them bleed because he states he thought that would help the swelling and infection. He reports having a small amount of bleeding, denies any active bleeding. Patient's mother reports he has had multiple dental procedures done s/p falling in middle school and knocking out multiple teeth. She notes he has not followed up with the dentist since due to having a traumatic experience as a child. Patient states he has only used ice to help with swelling.   Past Medical History  Diagnosis Date  . GERD (gastroesophageal reflux disease)   . ADHD (attention deficit hyperactivity disorder)   . Asperger syndrome    Past Surgical History  Procedure Laterality Date  . Laceration repair      in elementary school X 3   No family history on file. Social History  Substance Use Topics  . Smoking status: Former Smoker -- 0.50 packs/day for 1 years    Types: Cigarettes  . Smokeless tobacco: Never Used  . Alcohol Use: None    Review of Systems  Constitutional: Negative for fever and chills.  HENT: Positive for dental problem and facial swelling. Negative for trouble swallowing.   Respiratory: Negative for cough, shortness of breath, wheezing and stridor.   Cardiovascular: Negative for chest pain.  Gastrointestinal: Negative for nausea.  Neurological: Negative for headaches.       Allergies  Sulfa antibiotics  Home Medications   Prior to Admission medications   Medication Sig Start Date End Date Taking? Authorizing Provider  cetirizine (ZYRTEC) 10 MG tablet Take 1 tablet (10 mg total) by mouth daily. 06/05/15  Yes Tyrone Nine, MD  esomeprazole (NEXIUM) 40 MG capsule Take 40 mg by mouth daily.     Yes Historical Provider, MD  fluticasone (FLONASE) 50 MCG/ACT nasal spray Place 2 sprays into both nostrils daily. 06/05/15  Yes Tyrone Nine, MD  methylphenidate (RITALIN) 10 MG tablet Take 1 tablet (10 mg total) by mouth 2 (two) times daily. 07/23/15  Yes Tyrone Nine, MD  montelukast (SINGULAIR) 10 MG tablet Take 1 tablet (10 mg total) by mouth at bedtime. 07/05/13  Yes Andrena Mews, MD   BP 130/80 mmHg  Pulse 64  Temp(Src) 98.2 F (36.8 C) (Oral)  Resp 16  Ht  (1.803 m)  Wt 230 lb (104.327 kg)  BMI 32.09 kg/m2  SpO2 99% Physical Exam  Constitutional: He is oriented to person, place, and time. He appears well-developed and well-nourished. No distress.  HENT:  Head: Normocephalic and atraumatic.  Nose: Nose normal.  Mouth/Throat: Uvula is midline, oropharynx is clear and moist and mucous membranes are normal. No oral lesions. No trismus in the jaw. Abnormal dentition. Dental caries present. No dental abscesses, uvula swelling or lacerations. No oropharyngeal exudate, posterior oropharyngeal edema, posterior oropharyngeal erythema or tonsillar abscesses.  Poor dentition noted throughout with multiple dental caries. Erythema noted to left upper gingiva with mild swelling throughout  without any area of induration, fluctuance. No drainage. Mild swelling noted to left maxillary region.  Eyes: Conjunctivae and EOM are normal. Right eye exhibits no discharge. Left eye exhibits no discharge. No scleral icterus.  Neck: Normal range of motion. Neck supple.  Cardiovascular: Normal rate.   Pulmonary/Chest: Effort normal.  Lymphadenopathy:    He has no cervical  adenopathy.  Neurological: He is alert and oriented to person, place, and time.  Skin: He is not diaphoretic.  Nursing note and vitals reviewed.   ED Course  Procedures (including critical care time) Labs Review Labs Reviewed - No data to display  Imaging Review No results found. I have personally reviewed and evaluated these images and lab results as part of my medical decision-making.  Filed Vitals:   08/25/15 1013  BP: 130/80  Pulse: 64  Temp: 98.2 F (36.8 C)  Resp: 16     MDM   Final diagnoses:  Pain, dental    Patient presents to emergency department with left upper dental pain. Denies fever or any difficulty swallowing or breathing. No drainage. VSS. Exam revealed poor dentition noted throughout with multiple dental caries. Area of erythema with mild swelling noted to left upper gingiva without any obvious abscess noted. No trismus, drooling, neck swelling or stridor on exam. Plan to discharge patient home with penicillin and ibuprofen. Patient given information to follow-up with a dentist.  Evaluation does not show pathology requring ongoing emergent intervention or admission. Pt is hemodynamically stable and mentating appropriately. Discussed findings/results and plan with patient/guardian, who agrees with plan. All questions answered. Return precautions discussed and outpatient follow up given.      Satira Sarkicole Elizabeth VermillionNadeau, [place] 08/25/15 1222  Rolan BuccoMelanie Belfi, MD 08/25/15 [id number]

## 2015-09-21 ENCOUNTER — Emergency Department (HOSPITAL_BASED_OUTPATIENT_CLINIC_OR_DEPARTMENT_OTHER): Payer: Self-pay

## 2015-09-21 ENCOUNTER — Encounter (HOSPITAL_BASED_OUTPATIENT_CLINIC_OR_DEPARTMENT_OTHER): Payer: Self-pay | Admitting: Emergency Medicine

## 2015-09-21 ENCOUNTER — Emergency Department (HOSPITAL_BASED_OUTPATIENT_CLINIC_OR_DEPARTMENT_OTHER)
Admission: EM | Admit: 2015-09-21 | Discharge: 2015-09-21 | Disposition: A | Discharge status: Home / Self Care | Payer: Self-pay | Attending: Emergency Medicine | Admitting: Emergency Medicine

## 2015-09-21 DIAGNOSIS — J069 Acute upper respiratory infection, unspecified: Secondary | ICD-10-CM | POA: Insufficient documentation

## 2015-09-21 DIAGNOSIS — Z79899 Other long term (current) drug therapy: Secondary | ICD-10-CM | POA: Insufficient documentation

## 2015-09-21 DIAGNOSIS — Z87891 Personal history of nicotine dependence: Secondary | ICD-10-CM | POA: Insufficient documentation

## 2015-09-21 DIAGNOSIS — K219 Gastro-esophageal reflux disease without esophagitis: Secondary | ICD-10-CM | POA: Insufficient documentation

## 2015-09-21 DIAGNOSIS — F909 Attention-deficit hyperactivity disorder, unspecified type: Secondary | ICD-10-CM | POA: Insufficient documentation

## 2015-09-21 DIAGNOSIS — R059 Cough, unspecified: Secondary | ICD-10-CM

## 2015-09-21 DIAGNOSIS — J0100 Acute maxillary sinusitis, unspecified: Secondary | ICD-10-CM | POA: Insufficient documentation

## 2015-09-21 DIAGNOSIS — R05 Cough: Secondary | ICD-10-CM

## 2015-09-21 DIAGNOSIS — H9203 Otalgia, bilateral: Secondary | ICD-10-CM | POA: Insufficient documentation

## 2015-09-21 DIAGNOSIS — Z7951 Long term (current) use of inhaled steroids: Secondary | ICD-10-CM | POA: Insufficient documentation

## 2015-09-21 DIAGNOSIS — M791 Myalgia: Secondary | ICD-10-CM | POA: Insufficient documentation

## 2015-09-21 LAB — RAPID STREP SCREEN (MED CTR MEBANE ONLY): Streptococcus, Group A Screen (Direct): NEGATIVE

## 2015-09-21 MED ORDER — IBUPROFEN 200 MG PO TABS
ORAL_TABLET | ORAL | Status: AC
  Filled 2015-09-21: qty 1

## 2015-09-21 MED ORDER — BENZONATATE 100 MG PO CAPS
ORAL_CAPSULE | ORAL | Status: AC
  Filled 2015-09-21: qty 1

## 2015-09-21 MED ORDER — BENZONATATE 100 MG PO CAPS
100.0000 mg | ORAL_CAPSULE | Freq: Once | ORAL | Status: AC
  Administered 2015-09-21: 100 mg via ORAL
  Filled 2015-09-21: qty 1

## 2015-09-21 MED ORDER — CETIRIZINE HCL 10 MG PO TABS: 10.0000 mg | ORAL_TABLET | Freq: Every day | ORAL | Status: DC

## 2015-09-21 MED ORDER — FLUTICASONE PROPIONATE 50 MCG/ACT NA SUSP: 2.0000 | Freq: Every day | NASAL | Status: DC

## 2015-09-21 MED ORDER — IBUPROFEN 400 MG PO TABS
600.0000 mg | ORAL_TABLET | Freq: Once | ORAL | Status: AC
  Administered 2015-09-21: 600 mg via ORAL
  Filled 2015-09-21: qty 1

## 2015-09-21 MED ORDER — LORATADINE 10 MG PO TABS: 10.0000 mg | ORAL_TABLET | Freq: Once | ORAL | Status: DC

## 2015-09-21 MED ORDER — IBUPROFEN 400 MG PO TABS
ORAL_TABLET | ORAL | Status: AC
  Filled 2015-09-21: qty 1

## 2015-09-21 MED ORDER — BENZONATATE 100 MG PO CAPS: 100.0000 mg | ORAL_CAPSULE | Freq: Three times a day (TID) | ORAL | Status: DC | PRN

## 2015-09-21 MED ORDER — IBUPROFEN 600 MG PO TABS: 600.0000 mg | ORAL_TABLET | Freq: Three times a day (TID) | ORAL | Status: DC

## 2015-09-21 MED ORDER — OXYMETAZOLINE HCL 0.05 % NA SOLN
2.0000 | Freq: Once | NASAL | Status: AC
  Administered 2015-09-21: 2 via NASAL
  Filled 2015-09-21: qty 15

## 2015-09-21 NOTE — ED Notes (Signed)
Pt only meets 1 criteria for sirs

## 2015-09-21 NOTE — ED Notes (Signed)
Patient states that he has Body aches, and cough with a sore throat since yesterday

## 2015-09-21 NOTE — Discharge Instructions (Signed)
Sinusitis, Adult °Sinusitis is redness, soreness, and inflammation of the paranasal sinuses. Paranasal sinuses are air pockets within the bones of your face. They are located beneath your eyes, in the middle of your forehead, and above your eyes. In healthy paranasal sinuses, mucus is able to drain out, and air is able to circulate through them by way of your nose. However, when your paranasal sinuses are inflamed, mucus and air can become trapped. This can allow bacteria and other germs to grow and cause infection. °Sinusitis can develop quickly and last only a short time (acute) or continue over a long period (chronic). Sinusitis that lasts for more than 12 weeks is considered chronic. °CAUSES °Causes of sinusitis include: °· Allergies. °· Structural abnormalities, such as displacement of the cartilage that separates your nostrils (deviated septum), which can decrease the air flow through your nose and sinuses and affect sinus drainage. °· Functional abnormalities, such as when the small hairs (cilia) that line your sinuses and help remove mucus do not work properly or are not present. °SIGNS AND SYMPTOMS °Symptoms of acute and chronic sinusitis are the same. The primary symptoms are pain and pressure around the affected sinuses. Other symptoms include: °· Upper toothache. °· Earache. °· Headache. °· Bad breath. °· Decreased sense of smell and taste. °· A cough, which worsens when you are lying flat. °· Fatigue. °· Fever. °· Thick drainage from your nose, which often is green and may contain pus (purulent). °· Swelling and warmth over the affected sinuses. °DIAGNOSIS °Your health care provider will perform a physical exam. During your exam, your health care provider may perform any of the following to help determine if you have acute sinusitis or chronic sinusitis: °· Look in your nose for signs of abnormal growths in your nostrils (nasal polyps). °· Tap over the affected sinus to check for signs of  infection. °· View the inside of your sinuses using an imaging device that has a light attached (endoscope). °If your health care provider suspects that you have chronic sinusitis, one or more of the following tests may be recommended: °· Allergy tests. °· Nasal culture. A sample of mucus is taken from your nose, sent to a lab, and screened for bacteria. °· Nasal cytology. A sample of mucus is taken from your nose and examined by your health care provider to determine if your sinusitis is related to an allergy. °TREATMENT °Most cases of acute sinusitis are related to a viral infection and will resolve on their own within 10 days. Sometimes, medicines are prescribed to help relieve symptoms of both acute and chronic sinusitis. These may include pain medicines, decongestants, nasal steroid sprays, or saline sprays. °However, for sinusitis related to a bacterial infection, your health care provider will prescribe antibiotic medicines. These are medicines that will help kill the bacteria causing the infection. °Rarely, sinusitis is caused by a fungal infection. In these cases, your health care provider will prescribe antifungal medicine. °For some cases of chronic sinusitis, surgery is needed. Generally, these are cases in which sinusitis recurs more than 3 times per year, despite other treatments. °HOME CARE INSTRUCTIONS °· Drink plenty of water. Water helps thin the mucus so your sinuses can drain more easily. °· Use a humidifier. °· Inhale steam 3-4 times a day (for example, sit in the bathroom with the shower running). °· Apply a warm, moist washcloth to your face 3-4 times a day, or as directed by your health care provider. °· Use saline nasal sprays to help   moisten and clean your sinuses. °· Take medicines only as directed by your health care provider. °· If you were prescribed either an antibiotic or antifungal medicine, finish it all even if you start to feel better. °SEEK IMMEDIATE MEDICAL CARE IF: °· You have  increasing pain or severe headaches. °· You have nausea, vomiting, or drowsiness. °· You have swelling around your face. °· You have vision problems. °· You have a stiff neck. °· You have difficulty breathing. °  °This information is not intended to replace advice given to you by your health care provider. Make sure you discuss any questions you have with your health care provider. °  °Document Released: 10/04/2005 Document Revised: 10/25/2014 Document Reviewed: 10/19/2011 °Elsevier Interactive Patient Education ©2016 [employer] °Upper Respiratory Infection, Adult °Most upper respiratory infections (URIs) are a viral infection of the air passages leading to the lungs. A URI affects the nose, throat, and upper air passages. The most common type of URI is nasopharyngitis and is typically referred to as "the common cold." °URIs run their course and usually go away on their own. Most of the time, a URI does not require medical attention, but sometimes a bacterial infection in the upper airways can follow a viral infection. This is called a secondary infection. Sinus and middle ear infections are common types of secondary upper respiratory infections. °Bacterial pneumonia can also complicate a URI. A URI can worsen asthma and chronic obstructive pulmonary disease (COPD). Sometimes, these complications can require emergency medical care and may be life threatening.  °CAUSES °Almost all URIs are caused by viruses. A virus is a type of germ and can spread from one person to another.  °RISKS FACTORS °You may be at risk for a URI if:  °· You smoke.   °· You have chronic heart or lung disease. °· You have a weakened defense (immune) system.   °· You are very young or very old.   °· You have nasal allergies or asthma. °· You work in crowded or poorly ventilated areas. °· You work in health care facilities or schools. °SIGNS AND SYMPTOMS  °Symptoms typically develop 2-3 days after you come in contact with a cold virus. Most  viral URIs last 7-10 days. However, viral URIs from the influenza virus (flu virus) can last 14-18 days and are typically more severe. Symptoms may include:  °· Runny or stuffy (congested) nose.   °· Sneezing.   °· Cough.   °· Sore throat.   °· Headache.   °· Fatigue.   °· Fever.   °· Loss of appetite.   °· Pain in your forehead, behind your eyes, and over your cheekbones (sinus pain). °· Muscle aches.   °DIAGNOSIS  °Your health care provider may diagnose a URI by: °· Physical exam. °· Tests to check that your symptoms are not due to another condition such as: °¨ Strep throat. °¨ Sinusitis. °¨ Pneumonia. °¨ Asthma. °TREATMENT  °A URI goes away on its own with time. It cannot be cured with medicines, but medicines may be prescribed or recommended to relieve symptoms. Medicines may help: °· Reduce your fever. °· Reduce your cough. °· Relieve nasal congestion. °HOME CARE INSTRUCTIONS  °· Take medicines only as directed by your health care provider.   °· Gargle warm saltwater or take cough drops to comfort your throat as directed by your health care provider. °· Use a warm mist humidifier or inhale steam from a shower to increase air moisture. This may make it easier to breathe. °· Drink enough fluid to keep your urine clear or   pale yellow.   °· Eat soups and other clear broths and maintain good nutrition.   °· Rest as needed.   °· Return to work when your temperature has returned to normal or as your health care provider advises. You may need to stay home longer to avoid infecting others. You can also use a face mask and careful hand washing to prevent spread of the virus. °· Increase the usage of your inhaler if you have asthma.   °· Do not use any tobacco products, including cigarettes, chewing tobacco, or electronic cigarettes. If you need help quitting, ask your health care provider. °PREVENTION  °The best way to protect yourself from getting a cold is to practice good hygiene.  °· Avoid oral or hand contact with  people with cold symptoms.   °· Wash your hands often if contact occurs.   °There is no clear evidence that vitamin C, vitamin E, echinacea, or exercise reduces the chance of developing a cold. However, it is always recommended to get plenty of rest, exercise, and practice good nutrition.  °SEEK MEDICAL CARE IF:  °· You are getting worse rather than better.   °· Your symptoms are not controlled by medicine.   °· You have chills. °· You have worsening shortness of breath. °· You have brown or red mucus. °· You have yellow or brown nasal discharge. °· You have pain in your face, especially when you bend forward. °· You have a fever. °· You have swollen neck glands. °· You have pain while swallowing. °· You have white areas in the back of your throat. °SEEK IMMEDIATE MEDICAL CARE IF:  °· You have severe or persistent: °¨ Headache. °¨ Ear pain. °¨ Sinus pain. °¨ Chest pain. °· You have chronic lung disease and any of the following: °¨ Wheezing. °¨ Prolonged cough. °¨ Coughing up blood. °¨ A change in your usual mucus. °· You have a stiff neck. °· You have changes in your: °¨ Vision. °¨ Hearing. °¨ Thinking. °¨ Mood. °MAKE SURE YOU:  °· Understand these instructions. °· Will watch your condition. °· Will get help right away if you are not doing well or get worse. °  °This information is not intended to replace advice given to you by your health care provider. Make sure you discuss any questions you have with your health care provider. °  °Document Released: 03/30/2001 Document Revised: 02/18/2015 Document Reviewed: 01/09/2014 °Elsevier Interactive Patient Education ©2016 [employer]vier Inc. ° °

## 2015-09-21 NOTE — ED Provider Notes (Addendum)
CSN: [id number]     Arrival date & time 09/21/15  1433 History  By signing my name below, I, Derrick Carey, attest that this documentation has been prepared under the direction and in the presence of Loren Raceravid Shemica Meath, MD. Electronically Signed: Octavia HeirArianna Carey, ED Scribe. 09/21/2015. 3:37 PM.    Chief Complaint  Patient presents with  . Sore Throat  . Cough      The history is provided by the patient. No language interpreter was used.   HPI Comments: Derrick Carey is a 24 y.o. male who presents to the Emergency Department complaining of one day of moderate, constant, gradual worsening sore throat. Pt has been having associated chills, subjective fever, productive cough with yellow sputum, pain in bilateral ears, sinus pressure and generalized body aches. Pt reports taking Delsym and other aspirins to alleviate his symptoms with no relief. Pt denies nausea, vomiting, and diarrhea. No known sick contacts. No neck pain or stiffness. No chest pain or shortness breath.  Past Medical History  Diagnosis Date  . GERD (gastroesophageal reflux disease)   . ADHD (attention deficit hyperactivity disorder)   . Asperger syndrome    Past Surgical History  Procedure Laterality Date  . Laceration repair      in elementary school X 3   History reviewed. No pertinent family history. Social History  Substance Use Topics  . Smoking status: Former Smoker -- 0.50 packs/day for 1 years    Types: Cigarettes  . Smokeless tobacco: Never Used  . Alcohol Use: None    Review of Systems  Constitutional: Positive for fever and chills. Negative for fatigue.  HENT: Positive for congestion, ear pain, sinus pressure and sore throat. Negative for ear discharge, hearing loss, rhinorrhea, trouble swallowing and voice change.   Eyes: Negative for visual disturbance.  Respiratory: Positive for cough. Negative for shortness of breath.   Cardiovascular: Negative for chest pain, palpitations and leg swelling.   Gastrointestinal: Negative for nausea, vomiting, abdominal pain and diarrhea.  Genitourinary: Negative for dysuria and flank pain.  Musculoskeletal: Positive for myalgias. Negative for neck pain and neck stiffness.  Skin: Negative for rash.  Neurological: Negative for dizziness, weakness, light-headedness, numbness and headaches.  All other systems reviewed and are negative.     Allergies  Sulfa antibiotics  Home Medications   Prior to Admission medications   Medication Sig Start Date End Date Taking? Authorizing Provider  benzonatate (TESSALON) 100 MG capsule Take 1 capsule (100 mg total) by mouth 3 (three) times daily as needed for cough. 09/21/15   Loren Raceravid Kendyn Zaman, MD  cetirizine (ZYRTEC) 10 MG tablet Take 1 tablet (10 mg total) by mouth daily. 09/21/15   Loren Raceravid Rhilee Currin, MD  esomeprazole (NEXIUM) 40 MG capsule Take 40 mg by mouth daily.      Historical Provider, MD  fluticasone (FLONASE) 50 MCG/ACT nasal spray Place 2 sprays into both nostrils daily. 09/21/15   Loren Raceravid Genae Strine, MD  ibuprofen (ADVIL,MOTRIN) 600 MG tablet Take 1 tablet (600 mg total) by mouth 3 (three) times daily. 09/21/15   Loren Raceravid Shenna Brissette, MD  methylphenidate (RITALIN) 10 MG tablet Take 1 tablet (10 mg total) by mouth 2 (two) times daily. 07/23/15   Tyrone Nineyan B Grunz, MD  montelukast (SINGULAIR) 10 MG tablet Take 1 tablet (10 mg total) by mouth at bedtime. 07/05/13   Andrena MewsMichael D Rigby, MD   Triage vitals: BP 123/81 mmHg  Pulse 108  Temp(Src) 99.2 F (37.3 C) (Oral)  Resp 20  Ht 5\' 11"  (1.803 m)  Wt 230 lb (104.327 kg)  BMI 32.09 kg/m2  SpO2 97% Physical Exam  Constitutional: He is oriented to person, place, and time. He appears well-developed and well-nourished. No distress.  HENT:  Head: Normocephalic and atraumatic.  Mouth/Throat: Oropharynx is clear and moist.  Patient has bulging bilateral TMs. He has bilateral nasal mucosal edema. He also has bilateral maxillary sinus tenderness with percussion. Oropharynx is  erythematous. Uvula is midline. No exudates.  Eyes: EOM are normal. Pupils are equal, round, and reactive to light. Right eye exhibits no discharge. Left eye exhibits no discharge.  Neck: Normal range of motion. Neck supple.  No meningismus  Cardiovascular: Normal rate and regular rhythm.  Exam reveals no gallop and no friction rub.   No murmur heard. Pulmonary/Chest: Effort normal and breath sounds normal. No stridor. No respiratory distress. He has no wheezes. He has no rales. He exhibits no tenderness.  Abdominal: Soft. Bowel sounds are normal. He exhibits no distension and no mass. There is no tenderness. There is no rebound and no guarding.  Musculoskeletal: Normal range of motion. He exhibits no edema or tenderness.  No CVA tenderness bilaterally. No midline thoracic or lumbar tenderness. Distal pulses intact.  Lymphadenopathy:    He has cervical adenopathy.  Neurological: He is alert and oriented to person, place, and time.  Moves all extremities without deficit. Sensation is fully intact.  Skin: Skin is warm and dry. No rash noted. No erythema.  Psychiatric: He has a normal mood and affect. His behavior is normal.  Nursing note and vitals reviewed.   ED Course  Procedures  DIAGNOSTIC STUDIES: Oxygen Saturation is 97% on RA, normal by my interpretation.  COORDINATION OF CARE:  3:25 PM Discussed treatment plan with pt at bedside and pt agreed to plan.  Labs Review Labs Reviewed  RAPID STREP SCREEN (NOT AT [facility])  CULTURE, GROUP A STREP    Imaging Review Dg Chest 2 View  09/21/2015  CLINICAL DATA:  Productive cough with sore throat and fever since yesterday. Initial encounter. EXAM: CHEST  2 VIEW COMPARISON:  08/19/2011 radiographs. FINDINGS: The heart size and mediastinal contours are normal. The lungs are clear. There is no pleural effusion or pneumothorax. No acute osseous findings are identified. IMPRESSION: Stable chest.  No active cardiopulmonary process. Electronically  Signed   By: Carey Bullocks M.D.   On: 09/21/2015 15:28   I have personally reviewed and evaluated these images and lab results as part of my medical decision-making.   EKG Interpretation None      MDM   Final diagnoses:  URI (upper respiratory infection)  Acute maxillary sinusitis, recurrence not specified   I personally performed the services described in this documentation, which was scribed in my presence. The recorded information has been reviewed and is accurate.    Chest x-ray without any acute findings. Rapid strep is negative. Likely sinusitis with viral URI. We'll start back on Zyrtec and treat for symptom control. Patient's been advised to follow-up with his primary physician in several days if he has persistent symptoms. He understands the need to return for worsening of his symptoms or concerns.  Loren Racer, MD 09/21/15 1631  Loren Racer, MD 09/21/15 [phone]

## 2015-09-23 LAB — CULTURE, GROUP A STREP: STREP A CULTURE: NEGATIVE

## 2015-09-24 ENCOUNTER — Ambulatory Visit (INDEPENDENT_AMBULATORY_CARE_PROVIDER_SITE_OTHER): Payer: Self-pay | Admitting: Family Medicine

## 2015-09-24 ENCOUNTER — Encounter: Payer: Self-pay | Admitting: Family Medicine

## 2015-09-24 VITALS — BP 129/78 | HR 74 | Temp 98.0°F | Wt 244.3 lb

## 2015-09-24 DIAGNOSIS — J069 Acute upper respiratory infection, unspecified: Secondary | ICD-10-CM

## 2015-09-24 MED ORDER — BENZONATATE 100 MG PO CAPS: 100.0000 mg | ORAL_CAPSULE | Freq: Three times a day (TID) | ORAL | Status: DC | PRN

## 2015-09-24 MED ORDER — AMOXICILLIN 500 MG PO CAPS: 500.0000 mg | ORAL_CAPSULE | Freq: Three times a day (TID) | ORAL | Status: DC

## 2015-09-24 NOTE — Patient Instructions (Signed)
STOP taking Afrin Continue Flonase and Tessalon perles  I have prescribed amoxicillin three times a day for 7 days. If you start getting worse, please come back and see [place] if things are getting worse.   Upper Respiratory Infection, Adult Most upper respiratory infections (URIs) are a viral infection of the air passages leading to the lungs. A URI affects the nose, throat, and upper air passages. The most common type of URI is nasopharyngitis and is typically referred to as "the common cold." URIs run their course and usually go away on their own. Most of the time, a URI does not require medical attention, but sometimes a bacterial infection in the upper airways can follow a viral infection. This is called a secondary infection. Sinus and middle ear infections are common types of secondary upper respiratory infections. Bacterial pneumonia can also complicate a URI. A URI can worsen asthma and chronic obstructive pulmonary disease (COPD). Sometimes, these complications can require emergency medical care and may be life threatening.  CAUSES Almost all URIs are caused by viruses. A virus is a type of germ and can spread from one person to another.  RISKS FACTORS You may be at risk for a URI if:   You smoke.   You have chronic heart or lung disease.  You have a weakened defense (immune) system.   You are very young or very old.   You have nasal allergies or asthma.  You work in crowded or poorly ventilated areas.  You work in health care facilities or schools. SIGNS AND SYMPTOMS  Symptoms typically develop 2-3 days after you come in contact with a cold virus. Most viral URIs last 7-10 days. However, viral URIs from the influenza virus (flu virus) can last 14-18 days and are typically more severe. Symptoms may include:   Runny or stuffy (congested) nose.   Sneezing.   Cough.   Sore throat.   Headache.   Fatigue.   Fever.   Loss of appetite.   Pain in your forehead,  behind your eyes, and over your cheekbones (sinus pain).  Muscle aches.  DIAGNOSIS  Your health care provider may diagnose a URI by:  Physical exam.  Tests to check that your symptoms are not due to another condition such as:  Strep throat.  Sinusitis.  Pneumonia.  Asthma. TREATMENT  A URI goes away on its own with time. It cannot be cured with medicines, but medicines may be prescribed or recommended to relieve symptoms. Medicines may help:  Reduce your fever.  Reduce your cough.  Relieve nasal congestion. HOME CARE INSTRUCTIONS   Take medicines only as directed by your health care provider.   Gargle warm saltwater or take cough drops to comfort your throat as directed by your health care provider.  Use a warm mist humidifier or inhale steam from a shower to increase air moisture. This may make it easier to breathe.  Drink enough fluid to keep your urine clear or pale yellow.   Eat soups and other clear broths and maintain good nutrition.   Rest as needed.   Return to work when your temperature has returned to normal or as your health care provider advises. You may need to stay home longer to avoid infecting others. You can also use a face mask and careful hand washing to prevent spread of the virus.  Increase the usage of your inhaler if you have asthma.   Do not use any tobacco products, including cigarettes, chewing tobacco, or electronic cigarettes.  If you need help quitting, ask your health care provider. PREVENTION  The best way to protect yourself from getting a cold is to practice good hygiene.   Avoid oral or hand contact with people with cold symptoms.   Wash your hands often if contact occurs.  There is no clear evidence that vitamin C, vitamin E, echinacea, or exercise reduces the chance of developing a cold. However, it is always recommended to get plenty of rest, exercise, and practice good nutrition.  SEEK MEDICAL CARE IF:   You are getting  worse rather than better.   Your symptoms are not controlled by medicine.   You have chills.  You have worsening shortness of breath.  You have brown or red mucus.  You have yellow or brown nasal discharge.  You have pain in your face, especially when you bend forward.  You have a fever.  You have swollen neck glands.  You have pain while swallowing.  You have white areas in the back of your throat. SEEK IMMEDIATE MEDICAL CARE IF:   You have severe or persistent:  Headache.  Ear pain.  Sinus pain.  Chest pain.  You have chronic lung disease and any of the following:  Wheezing.  Prolonged cough.  Coughing up blood.  A change in your usual mucus.  You have a stiff neck.  You have changes in your:  Vision.  Hearing.  Thinking.  Mood. MAKE SURE YOU:   Understand these instructions.  Will watch your condition.  Will get help right away if you are not doing well or get worse.   This information is not intended to replace advice given to you by your health care provider. Make sure you discuss any questions you have with your health care provider.   Document Released: 03/30/2001 Document Revised: 02/18/2015 Document Reviewed: 01/09/2014 Elsevier Interactive Patient Education [employer]2016 [employer]

## 2015-09-24 NOTE — Progress Notes (Signed)
Patient ID: Derrick Carey, male   DOB: [birth year], 24 y.o.   MRN: [id number]    Subjective: CC: cold  HPI: Patient is a 24 y.o. male with a past medical history of sinusitis and allergic rhinitis presenting to clinic today for a same day appt.  Patient noted that he's been sick since Saturday. It started as a cough, facial pain that is throbbing, nasal congestion, and a fever of 104.6. He went to UC who diagnosed him with a URI and prescribed zyrtec, Ibuprofen, Flonase, and Tessalon. He notes his fever resolved on Sunday. He felt he was doing better on Monday and even went to work the full day. Unfortunately he became worse Monday night/Tuesday AM. His cough is productive of yellow/green phlegm. No fevers currently.  His body aches are mostly in arms and shoulders. He feels the Tessalon and Deslym which he's been taking from home have been helpful. No SOB, chest pain, DOE. Endorses headache from facial pain that worse with looking down.   No known sick contacts.    Social History: former smoker  Health Maintenance: Patient declined flu vaccine previously  ROS: All other systems reviewed and are negative.  Past Medical History Patient Active Problem List   Diagnosis Date Noted  . Acute upper respiratory infection 09/24/2015  . Tobacco use disorder 07/23/2015  . Obesity 07/23/2015  . Papule of skin 06/19/2015  . Allergic rhinitis 06/05/2015  . Acute sinusitis 11/26/2013  . Cough 10/04/2012  . Dry skin 02/08/2012    Class: Acute  . ACNE 02/17/2007  . Attention deficit hyperactivity disorder (ADHD) 12/15/2006    Medications- reviewed and updated Current Outpatient Prescriptions  Medication Sig Dispense Refill  . amoxicillin (AMOXIL) 500 MG capsule Take 1 capsule (500 mg total) by mouth 3 (three) times daily. 21 capsule 0  . benzonatate (TESSALON) 100 MG capsule Take 1 capsule (100 mg total) by mouth 3 (three) times daily as needed for cough. 21 capsule 0  . cetirizine (ZYRTEC) 10  MG tablet Take 1 tablet (10 mg total) by mouth daily. 30 tablet 0  . esomeprazole (NEXIUM) 40 MG capsule Take 40 mg by mouth daily.      . fluticasone (FLONASE) 50 MCG/ACT nasal spray Place 2 sprays into both nostrils daily. 16 g 0  . ibuprofen (ADVIL,MOTRIN) 600 MG tablet Take 1 tablet (600 mg total) by mouth 3 (three) times daily. 30 tablet 0  . methylphenidate (RITALIN) 10 MG tablet Take 1 tablet (10 mg total) by mouth 2 (two) times daily. 60 tablet 0  . montelukast (SINGULAIR) 10 MG tablet Take 1 tablet (10 mg total) by mouth at bedtime. 30 tablet 3   No current facility-administered medications for this visit.    Objective: Office vital signs reviewed. BP 129/78 mmHg  Pulse 74  Temp(Src) 98 F (36.7 C) (Oral)  Wt 244 lb 4.8 oz (110.814 kg)   Physical Examination:  General: Awake, alert, well  nourished, NAD. No meningeal signs.  ENMT:  TMs intact, normal light reflex, no erythema, no bulging. Nares with crusted yellow drainage.  MMM, Oropharynx erythematous. Uvula midline. Frontal and maxillary tenderness.  Eyes: Conjunctiva mildly-injected. Some watery eyes.  PERRL.  Cardio: RRR, no m/r/g noted.  Pulm: No increased WOB.  CTAB, without wheezes, rhonchi or crackles noted. Intermittently productive cough.   Assessment/Plan: Acute upper respiratory infection Patient presenting with URI symptoms that improved initially and then worsened, concerning for viral infection with superimposed bacterial infection. It is difficult to know whether his  facial pain is secondary to acute bacterial sinusitis vs his chronic symptoms as he always has facial pain reportedly. Lungs clear without increased WOB, continues to have good PO intake.  - Rx amoxicillin  q8hrs x 7 days  - refilled Tessalon capsules - stressed importance of continuing to use Flonase - discussed RTC precautions.     No orders of the defined types were placed in this encounter.    Meds ordered this encounter    Medications  . amoxicillin (AMOXIL) 500 MG capsule    Sig: Take 1 capsule (500 mg total) by mouth 3 (three) times daily.    Dispense:  21 capsule    Refill:  0  . benzonatate (TESSALON) 100 MG capsule    Sig: Take 1 capsule (100 mg total) by mouth 3 (three) times daily as needed for cough.    Dispense:  21 capsule    Refill:  0    Joanna Puff PGY-2, [facility]

## 2015-09-24 NOTE — Assessment & Plan Note (Signed)
Patient presenting with URI symptoms that improved initially and then worsened, concerning for viral infection with superimposed bacterial infection. It is difficult to know whether his facial pain is secondary to acute bacterial sinusitis vs his chronic symptoms as he always has facial pain reportedly. Lungs clear without increased WOB, continues to have good PO intake.  - Rx amoxicillin 500mg  q8hrs x 7 days  - refilled Tessalon capsules - stressed importance of continuing to use Flonase - discussed RTC precautions.

## 2015-10-02 ENCOUNTER — Ambulatory Visit (INDEPENDENT_AMBULATORY_CARE_PROVIDER_SITE_OTHER): Payer: Self-pay | Admitting: Student

## 2015-10-02 ENCOUNTER — Encounter: Payer: Self-pay | Admitting: Student

## 2015-10-02 VITALS — BP 134/69 | HR 69 | Temp 98.0°F | Wt 242.8 lb

## 2015-10-02 DIAGNOSIS — J069 Acute upper respiratory infection, unspecified: Secondary | ICD-10-CM

## 2015-10-02 NOTE — Assessment & Plan Note (Signed)
Resolving symtoms with more moe day of antibiotics left - Will complete course of antibiotics and other wise continue conservative therapy - return precautions reviewed

## 2015-10-02 NOTE — Patient Instructions (Signed)
Return in 2 weeks for sinus symptoms If you have questions or concerns call the office at [phone]

## 2015-10-02 NOTE — Progress Notes (Signed)
   Subjective:    Patient ID: Derrick Carey C Cardiff, male    DOB: 08-10-1991, 24 y.o.   MRN: [id number]   CC: follow up for sinusitis   HPI 24 y/o here for follow up after being started on amoxicillin for sinusitis treatment  Sinusitis - Has 1 more day left of antibiotic therapy - Starting to feel better with improved body aches, vastly improved facial pain - continues to have cough - denies fever, but does have a thermometer so has not been checking - denies N/D/V, SOB, chest pain  Review of Systems   See HPI for ROS.   Past Medical History  Diagnosis Date  . GERD (gastroesophageal reflux disease)   . ADHD (attention deficit hyperactivity disorder)   . Asperger syndrome    Past Surgical History  Procedure Laterality Date  . Laceration repair      in elementary school X 3    Social History   Social History  . Marital Status: Single    Spouse Name: N/A  . Number of Children: N/A  . Years of Education: N/A   Occupational History  . Not on file.   Social History Main Topics  . Smoking status: Former Smoker -- 0.50 packs/day for 1 years    Types: Cigarettes  . Smokeless tobacco: Never Used  . Alcohol Use: Not on file  . Drug Use: Not on file  . Sexual Activity: Not on file   Other Topics Concern  . Not on file   Social History Narrative   [employer] -    Lives at home with Mom and Dad          Objective:  BP 134/69 mmHg  Pulse 69  Temp(Src) 98 F (36.7 C) (Oral)  Wt 242 lb 12.8 oz (110.133 kg) Vitals and nursing note reviewed  General: NAD HEENT: normal TMs bilaterally, normal oropharynx with no lesions or erythema, mild tenderness to bilateral maxilary sinuses, no frontal sinus pain Cardiac: RRR,  Respiratory: CTAB, normal effort Abdomen: soft, nontender, nondistended,  Skin: warm and dry, no rashes noted Neuro: alert and oriented, no focal deficits   Assessment & Plan:    Acute upper respiratory infection Resolving symtoms with more  moe day of antibiotics left - Will complete course of antibiotics and other wise continue conservative therapy - return precautions reviewed     Okechukwu Regnier A. Kennon RoundsHaney MD, MS Family Medicine Resident PGY-2 Pager [phone]

## 2015-10-16 ENCOUNTER — Ambulatory Visit: Payer: Self-pay | Admitting: Family Medicine

## 2015-11-04 ENCOUNTER — Encounter: Payer: Self-pay | Admitting: Family Medicine

## 2015-11-04 ENCOUNTER — Ambulatory Visit (INDEPENDENT_AMBULATORY_CARE_PROVIDER_SITE_OTHER): Payer: Managed Care, Other (non HMO) | Admitting: Family Medicine

## 2015-11-04 VITALS — BP 130/82 | HR 90 | Temp 98.6°F | Wt 240.7 lb

## 2015-11-04 DIAGNOSIS — J111 Influenza due to unidentified influenza virus with other respiratory manifestations: Secondary | ICD-10-CM

## 2015-11-04 MED ORDER — BENZONATATE 100 MG PO CAPS: 100.0000 mg | ORAL_CAPSULE | Freq: Three times a day (TID) | ORAL | Status: DC | PRN

## 2015-11-04 MED ORDER — ACETAMINOPHEN-CODEINE #3 300-30 MG PO TABS: 1.0000 | ORAL_TABLET | ORAL | Status: DC | PRN

## 2015-11-04 MED ORDER — FLUTICASONE PROPIONATE 50 MCG/ACT NA SUSP: 2.0000 | Freq: Every day | NASAL | Status: DC

## 2015-11-04 NOTE — Patient Instructions (Signed)
Influenza, Adult Influenza (flu) is an infection in the mouth, nose, and throat (respiratory tract) caused by a virus. The flu can make you feel very ill. Influenza spreads easily from person to person (contagious).  HOME CARE   Only take medicines as told by your doctor.  Use a cool mist humidifier to make breathing easier.  Get plenty of rest until your fever goes away. This usually takes 3 to 4 days.  Drink enough fluids to keep your pee (urine) clear or pale yellow.  Cover your mouth and nose when you cough or sneeze.  Wash your hands well to avoid spreading the flu.  Stay home from work or school until your fever has been gone for at least 1 full day.  Get a flu shot every year. GET HELP RIGHT AWAY IF:   You have trouble breathing or feel short of breath.  Your skin or nails turn blue.  You have severe neck pain or stiffness.  You have a severe headache, facial pain, or earache.  Your fever gets worse or keeps coming back.  You feel sick to your stomach (nauseous), throw up (vomit), or have watery poop (diarrhea).  You have chest pain.  You have a deep cough that gets worse, or you cough up more thick spit (mucus). MAKE SURE YOU:   Understand these instructions.  Will watch your condition.  Will get help right away if you are not doing well or get worse.   This information is not intended to replace advice given to you by your health care provider. Make sure you discuss any questions you have with your health care provider.   Document Released: 07/13/2008 Document Revised: 10/25/2014 Document Reviewed: 01/03/2012 Elsevier Interactive Patient Education 2016 Elsevier Inc.  

## 2015-11-04 NOTE — Progress Notes (Signed)
    Subjective:  Derrick Carey is a 25 y.o. male who presents to the [facility] today with a chief complaint of cough.   HPI:  Cough Started 3 days ago. Associated with rhinorrhea, sore throat, facial pain, headache, ear pain, fever to 101F last night, back pain, muscle pain, and joint pain. Father is also sick with similar symptoms. Has not noticed anything that makes it better or worse. Did not get a flu shot. Has not tried any medications.  ROS: Per HPI  PMH:  The following were reviewed and entered/updated in epic: Past Medical History  Diagnosis Date  . GERD (gastroesophageal reflux disease)   . ADHD (attention deficit hyperactivity disorder)   . Asperger syndrome    Patient Active Problem List   Diagnosis Date Noted  . Acute upper respiratory infection 09/24/2015  . Tobacco use disorder 07/23/2015  . Obesity 07/23/2015  . Papule of skin 06/19/2015  . Allergic rhinitis 06/05/2015  . Acute sinusitis 11/26/2013  . Cough 10/04/2012  . Dry skin 02/08/2012    Class: Acute  . ACNE 02/17/2007  . Attention deficit hyperactivity disorder (ADHD) 12/15/2006   Past Surgical History  Procedure Laterality Date  . Laceration repair      in elementary school X 3     Objective:  Physical Exam: BP 130/82 mmHg  Pulse 90  Temp(Src) 98.6 F (37 C) (Oral)  Wt 240 lb 11.2 oz (109.181 kg)  Gen: NAD, resting comfortably HEENT: O/p erythematous without exudate. TM with clear effusions bilaterally. No LAD. Face tender to palpation. CV: RRR with no murmurs appreciated Pulm: NWOB, CTAB with no crackles, wheezes, or rhonchi MSK: no edema, cyanosis, or clubbing noted Skin: warm, dry Neuro: grossly normal, moves all extremities Psych: Normal affect and thought content  Assessment/Plan:  URI Presentation most likely influenza given constellation of high grade fevers, body aches along with other URI symptoms. No signs of pneumonia. Will not start tamiflu as patient has had symptoms for 3  days. Will treat symptomatically with tylenol #3, tessalon, and flonase. Return precautions reviewed.   Katina Degree. Jimmey Ralph, MD [facility] Family Medicine Resident PGY-2 11/04/2015 10:15 AM

## 2015-12-01 ENCOUNTER — Encounter: Payer: Self-pay | Admitting: Family Medicine

## 2015-12-01 ENCOUNTER — Ambulatory Visit (INDEPENDENT_AMBULATORY_CARE_PROVIDER_SITE_OTHER): Payer: Managed Care, Other (non HMO) | Admitting: Family Medicine

## 2015-12-01 VITALS — BP 110/78 | HR 72 | Temp 98.3°F | Wt 243.2 lb

## 2015-12-01 DIAGNOSIS — K089 Disorder of teeth and supporting structures, unspecified: Secondary | ICD-10-CM | POA: Diagnosis not present

## 2015-12-01 MED ORDER — IPRATROPIUM BROMIDE 0.03 % NA SOLN: 2.0000 | Freq: Two times a day (BID) | NASAL | Status: DC

## 2015-12-01 NOTE — Progress Notes (Signed)
   Subjective:    Patient ID: Derrick Carey, male    DOB: [birth year], 25 y.o.   MRN: [id number]  Seen for Same day visit for   CC: ill   He has been feeling ill off and on since December. Upon chart review he was seen in the emergency department November 7 for dental pain. He was to be discharged on penicillin and ibuprofen for him. He was seen in the emergency department on December 4 and rapid strep was negative. He was seen December 15 in the family medicine clinic and diagnosed with a upper respiratory infection. He was seen January 17 in the family medicine Department and diagnosed with upper respiratory infection. He reports that his symptoms of intermittent for the past 2 months. He has having some dizziness upon standing. This dizziness is associated with lightheadedness and resolves after period time. He also has burning when he is breathing through his nose.  he is unable to report any objective afebrile findings were reports of feeling warm couple of times for the past week He uses Flonase and Ocean nasal spray. He also uses Delsym for cough. He was not able work on Friday due to his lightheadedness and did not go to work today. He denies any abdominal pain, dysuria, constipation, diarrhea, nausea, vomiting, or tooth pain. He does report some ear pain bilaterally, some shortness of breath, and persistent cough. Upon chart review it also looks like he has a cough with seasonal allergies that are long-standing. He has no sick contacts.  He is a former smoker and quit when he was in high school. He denies any prior surgeries in his sinuses.   Review of Systems   See HPI for ROS. Objective:  BP 110/78 mmHg  Pulse 72  Temp(Src) 98.3 F (36.8 C) (Oral)  Wt 243 lb 3.2 oz (110.315 kg)  SpO2 98%  General: NAD HEENT: Missing his left central incisor, poor dentition throughout, several cavities, no dental pain upon palpation, uvula midline, tympanic membranes clear and intact  bilaterally, no cervical lymphadenopathy, no frontal sinus or maxillary sinus pressure to palpation, clear conjunctiva, turbinates boggy with mild erythema bilaterally, moist mucous membranes, Cardiac: RRR, normal heart sounds, no murmurs.  Respiratory: CTAB, normal effort Abdomen: soft, nontender, nondistended, no hepatic or splenomegaly. Bowel sounds present Extremities: WWP. Skin: warm and dry, no rashes noted Neuro: alert and oriented, no focal deficits     Assessment & Plan:   Poor dentition He reports some intermittent symptoms that are inconsistent in nature. Possibly related to his allergies versus is poor dentition I advised that he needs to have his teeth looked and corrected to start with. I feel that this would improve his symptoms as he has a potential for an infection in his teeth. - I advised to stop Flonase for a couple of weeks and added Atrovent. - Given handouts with dentist information on them - If still having problems in the next two weeks or so then follow-up with me or Dr. Jarvis Newcomer

## 2015-12-01 NOTE — Patient Instructions (Signed)
Thank you for coming in,   I would use the atrovent for the next two weeks and then you can use the flonase again after that.   Please be seen by a dentist as I feel that your teeth need to be addressed.   Sign up for My Chart to have easy access to your labs results, and communication with your Primary care physician   Please feel free to call with any questions or concerns at any time, at [phone]. --Dr. Jordan Likes

## 2015-12-02 DIAGNOSIS — K089 Disorder of teeth and supporting structures, unspecified: Secondary | ICD-10-CM | POA: Insufficient documentation

## 2015-12-02 NOTE — Assessment & Plan Note (Signed)
He reports some intermittent symptoms that are inconsistent in nature. Possibly related to his allergies versus is poor dentition I advised that he needs to have his teeth looked and corrected to start with. I feel that this would improve his symptoms as he has a potential for an infection in his teeth. - I advised to stop Flonase for a couple of weeks and added Atrovent. - Given handouts with dentist information on them - If still having problems in the next two weeks or so then follow-up with me or Dr. Jarvis Newcomer

## 2015-12-09 ENCOUNTER — Encounter: Payer: Managed Care, Other (non HMO) | Admitting: Family Medicine

## 2015-12-15 ENCOUNTER — Ambulatory Visit (INDEPENDENT_AMBULATORY_CARE_PROVIDER_SITE_OTHER): Payer: Managed Care, Other (non HMO) | Admitting: Family Medicine

## 2015-12-15 VITALS — BP 133/71 | HR 83 | Temp 98.1°F | Wt 243.6 lb

## 2015-12-15 DIAGNOSIS — K089 Disorder of teeth and supporting structures, unspecified: Secondary | ICD-10-CM

## 2015-12-15 DIAGNOSIS — Z114 Encounter for screening for human immunodeficiency virus [HIV]: Secondary | ICD-10-CM

## 2015-12-15 DIAGNOSIS — Z23 Encounter for immunization: Secondary | ICD-10-CM

## 2015-12-15 DIAGNOSIS — E669 Obesity, unspecified: Secondary | ICD-10-CM

## 2015-12-15 DIAGNOSIS — F902 Attention-deficit hyperactivity disorder, combined type: Secondary | ICD-10-CM | POA: Diagnosis not present

## 2015-12-15 LAB — LIPID PANEL
CHOL/HDL RATIO: 8.1 ratio — AB (ref <=5.0)
CHOLESTEROL: 210 mg/dL — AB (ref 125–200)
HDL: 26 mg/dL — AB (ref >=40)
LDL Cholesterol: 134 mg/dL — ABNORMAL HIGH (ref <130)
Triglycerides: 249 mg/dL — ABNORMAL HIGH (ref <150)
VLDL: 50 mg/dL — ABNORMAL HIGH (ref <30)

## 2015-12-15 LAB — CBC
HCT: 47.6 % (ref 39.0–52.0)
Hemoglobin: 16.4 g/dL (ref 13.0–17.0)
MCH: 29.1 pg (ref 26.0–34.0)
MCHC: 34.5 g/dL (ref 30.0–36.0)
MCV: 84.5 fL (ref 78.0–100.0)
MPV: 12.9 fL — AB (ref 8.6–12.4)
PLATELETS: 188 10*3/uL (ref 150–400)
RBC: 5.63 MIL/uL (ref 4.22–5.81)
RDW: 13.2 % (ref 11.5–15.5)
WBC: 7 10*3/uL (ref 4.0–10.5)

## 2015-12-15 LAB — HIV ANTIBODY (ROUTINE TESTING W REFLEX): HIV 1&2 Ab, 4th Generation: NONREACTIVE

## 2015-12-15 MED ORDER — METHYLPHENIDATE HCL 10 MG PO TABS: 10.0000 mg | ORAL_TABLET | Freq: Two times a day (BID) | ORAL | Status: DC

## 2015-12-15 NOTE — Patient Instructions (Signed)
Thank you for coming in today!  We are checking some labs today, and we'll call you if they are abnormal. If you do not hear from me by phone or letter in 2 weeks, please call us as we may have been unable to reach you.   - Come back in about 1 month to discuss ADHD  Our clinic's number is [phone]. Feel free to call any time with questions or concerns. We will answer any questions after hours with our 24-hour emergency line at that number as well.   - Dr. Jarvis Newcomer

## 2015-12-15 NOTE — Progress Notes (Signed)
Subjective: Derrick Carey is a 25 y.o. male accompanied by his father, presenting for ADHD follow up.  ADHD Dx at age 10 - 48, intermittently treated since that time with improvement on ritalin but has a hard time remembering to take it. He is currently working full-time and reports good control with ritalin of predominantly inattentive symptoms.   - ROS: + occasional headaches, recent fever and cough. Otherwise full review of systems was negative.  - Smoker  Objective: BP 133/71 mmHg  Pulse 83  Temp(Src) 98.1 F (36.7 C) (Oral)  Wt 243 lb 9.6 oz (110.496 kg) Gen: Overweight, pleasant 25 y.o. male in no distress HEENT: MMM, posterior oropharynx clear, multiple caries Pulm: Non-labored; CTAB, no wheezes  CV: Regular rate, no murmur appreciated; distal pulses intact/symmetric GI: + BS; soft, non-tender, non-distended Skin: No rashes, wounds, ulcers Neuro: A&Ox3, CN II-XII without deficits Psych: Disheveled and dressed casually. Maintains fair eye contact and is cooperative and attentive. Speech is normal volume and rate. Mood is "fine but sometimes anxious" with a broad affect. Thought process is logical and goal directed. No suicidal or homicidal ideation. Does not appear to be responding to any internal stimuli. Able to maintain train of thought and concentrate on the questions.  Assessment/Plan: Derrick Carey is a 25 y.o. male here for ADHD follow up.   Attention deficit hyperactivity disorder (ADHD) Reports methylphenidate  BID has helped symptoms, but he forgets to take it (last prescribed month supply almost 5 months ago). Adherence may also be affected by lack of insurance, though he is back on his mother's insurance now. Offered to switch to once daily formulation or cut out PM dose, but he desires to continue and will set alarms. F/u 1 month  Poor dentition Strongly advised to get a dentist as the final common pathway for his lack of dental care is pain and abscess among  others.   Obesity Weight stable. Reviewed need for balanced diet, information on MyPlate given and reviewed. Will check FLP.

## 2015-12-16 NOTE — Assessment & Plan Note (Signed)
Weight stable. Reviewed need for balanced diet, information on MyPlate given and reviewed. Will check FLP.

## 2015-12-16 NOTE — Assessment & Plan Note (Addendum)
Reports methylphenidate  BID has helped symptoms, but he forgets to take it (last prescribed month supply almost 5 months ago). Adherence may also be affected by lack of insurance, though he is back on his mother's insurance now. Offered to switch to once daily formulation or cut out PM dose, but he desires to continue and will set alarms. F/u 1 month

## 2015-12-16 NOTE — Assessment & Plan Note (Signed)
Strongly advised to get a dentist as the final common pathway for his lack of dental care is pain and abscess among others.

## 2015-12-18 ENCOUNTER — Encounter: Payer: Self-pay | Admitting: Family Medicine

## 2016-01-12 ENCOUNTER — Ambulatory Visit (INDEPENDENT_AMBULATORY_CARE_PROVIDER_SITE_OTHER): Payer: Managed Care, Other (non HMO) | Admitting: Family Medicine

## 2016-01-12 ENCOUNTER — Encounter: Payer: Self-pay | Admitting: Family Medicine

## 2016-01-12 VITALS — BP 115/65 | HR 77 | Temp 98.1°F | Ht 71.0 in | Wt 245.8 lb

## 2016-01-12 DIAGNOSIS — F902 Attention-deficit hyperactivity disorder, combined type: Secondary | ICD-10-CM

## 2016-01-12 MED ORDER — METHYLPHENIDATE HCL 10 MG PO TABS: 10.0000 mg | ORAL_TABLET | Freq: Every morning | ORAL | Status: DC

## 2016-01-12 NOTE — Progress Notes (Signed)
Subjective: Derrick Carey is a 25 y.o. male accompanied by his father, presenting for ADHD follow up.  Ritalin prescribed at last visit has improved symptoms dramatically at work. He remembers to take medication most days but often forgets 2nd dose, though reports no significant impact on QOL due to this. ADHD Dx at age [age]- 7, intermittently treated since that time with improvement on ritalin but has a hard time remembering to take it. He is currently working full-time and reports good control with ritalin of predominantly inattentive symptoms.   - ROS: + occasional headaches, recent fever and cough. Otherwise full review of systems was negative.  - Smoker  Objective: BP 115/65 mmHg  Pulse 77  Temp(Src) 98.1 F (36.7 C) (Oral)  Ht 5\' 11"  (1.803 m)  Wt 245 lb 12.8 oz (111.494 kg)  BMI 34.30 kg/m2 Gen: Overweight, pleasant 25 y.o. male in no distress HEENT: MMM, posterior oropharynx clear, multiple caries Pulm: Non-labored; CTAB, no wheezes  CV: Regular rate, no murmur appreciated; distal pulses intact/symmetric GI: + BS; soft, non-tender, non-distended Skin: No rashes, wounds, ulcers Neuro: A&Ox3, CN II-XII without deficits Psych: Disheveled and dressed casually. Maintains fair eye contact and is cooperative and attentive. Speech is normal volume and rate. Mood is "fine but sometimes anxious" with a broad affect. Thought process is logical and goal directed. No suicidal or homicidal ideation. Does not appear to be responding to any internal stimuli. Able to maintain train of thought and concentrate on the questions.  Assessment/Plan: Derrick Carey is a [age] y.o. male here for ADHD follow up.   Attention deficit hyperactivity disorder (ADHD) Good symptom control on current dose of ritalin 10mg , taking only once daily which seems to balance symptom control and side effects well, so will prescribe just once daily dosing (not of ER formulation). Since no dose changes, will give 2 months  supply.

## 2016-01-12 NOTE — Patient Instructions (Signed)
Thank you for coming in today!  Take the medication once a day, I'll see you in about 2 months or sooner if needed.   Our clinic's number is [phone]. Feel free to call any time with questions or concerns. We will answer any questions after hours with our 24-hour emergency line at that number as well.   - Dr. Jarvis NewcomerGrunz

## 2016-01-21 NOTE — Assessment & Plan Note (Signed)
Good symptom control on current dose of ritalin 10mg , taking only once daily which seems to balance symptom control and side effects well, so will prescribe just once daily dosing (not of ER formulation). Since no dose changes, will give 2 months supply.

## 2016-10-06 ENCOUNTER — Ambulatory Visit: Payer: Managed Care, Other (non HMO) | Admitting: Student

## 2016-10-13 ENCOUNTER — Ambulatory Visit: Payer: Managed Care, Other (non HMO) | Admitting: Family Medicine

## 2016-10-27 ENCOUNTER — Ambulatory Visit: Payer: Managed Care, Other (non HMO) | Admitting: Student

## 2016-11-25 ENCOUNTER — Encounter: Payer: Self-pay | Admitting: Internal Medicine

## 2016-11-25 ENCOUNTER — Ambulatory Visit (INDEPENDENT_AMBULATORY_CARE_PROVIDER_SITE_OTHER): Payer: Self-pay | Admitting: Internal Medicine

## 2016-11-25 ENCOUNTER — Telehealth: Payer: Self-pay | Admitting: Student

## 2016-11-25 DIAGNOSIS — J069 Acute upper respiratory infection, unspecified: Secondary | ICD-10-CM

## 2016-11-25 DIAGNOSIS — B9789 Other viral agents as the cause of diseases classified elsewhere: Secondary | ICD-10-CM

## 2016-11-25 MED ORDER — BENZONATATE 100 MG PO CAPS: 100.0000 mg | ORAL_CAPSULE | Freq: Three times a day (TID) | ORAL | 0 refills | Status: DC | PRN

## 2016-11-25 NOTE — Telephone Encounter (Signed)
Routing to provider who saw the patient today. Thanks!

## 2016-11-25 NOTE — Assessment & Plan Note (Signed)
Symptoms most consistent with viral URI. Flu is included on differential, but patient has already completed a course of Tamiflu about a week and a half ago without resolution of symptoms. Patient with no respiratory difficulties and lungs CTAB. Afebrile and well-hydrated on exam. As such, will proceed with conservative management.  - Tessalon perles TID PRN - Honey and elevate head of bed at night for cough - Ibuprofen/Tylenol for myalgias

## 2016-11-25 NOTE — Telephone Encounter (Signed)
rx for tessalon needs [facility] because he has the [facility] card

## 2016-11-25 NOTE — Progress Notes (Signed)
   Subjective:   Patient: Derrick Carey       Birthdate: 05-Jun-1991       MRN: [id number]      HPI  Derrick Carey is a 26 y.o. male presenting for same day appointment for URI symptoms.   Patient reports symptoms for the past two weeks, including myalgias, chills, non-productive cough, and nasal congestion. He denies fevers. Of note, his mother was evaluated at this office about the time that his symptoms began, and was diagnosed with the flu. He was given five days of Tamiflu to take at that time, and completed the course. Patient's symptoms have improved somewhat, but have not gone away. He has been taking Robitussin which does help. He is eating and drinking normally. Denies vomiting or diarrhea. His cough is worse at night, so he has had difficulty sleeping.   Smoking status reviewed. Patient is former smoker.   Review of Systems See HPI.     Objective:  Physical Exam  Constitutional: He is oriented to person, place, and time.  Sitting on exam table wearing mask. Obese male in NAD.   HENT:  Head: Normocephalic and atraumatic.  Nose: Nose normal.  Mouth/Throat: Oropharynx is clear and moist. No oropharyngeal exudate.  Eyes: Conjunctivae and EOM are normal. Right eye exhibits no discharge. Left eye exhibits no discharge.  Neck: Normal range of motion. Neck supple.  Pulmonary/Chest: No stridor.  Coughing occasionally throughout exam. Normal WOB on RA. Able to speak in full sentences. Lungs CTAB with no wheezing or crackles.   Lymphadenopathy:    He has cervical adenopathy.  Neurological: He is alert and oriented to person, place, and time.  Skin: Skin is warm and dry.  Psychiatric: Affect and judgment normal.     Assessment & Plan:  URI (upper respiratory infection) Symptoms most consistent with viral URI. Flu is included on differential, but patient has already completed a course of Tamiflu about a week and a half ago without resolution of symptoms. Patient with no respiratory  difficulties and lungs CTAB. Afebrile and well-hydrated on exam. As such, will proceed with conservative management.  - Tessalon perles TID PRN - Honey and elevate head of bed at night for cough - Ibuprofen/Tylenol for myalgias   Tarri AbernethyAbigail J Keston Seever, MD, MPH PGY-2 [facility] Pager [phone]

## 2016-11-25 NOTE — Patient Instructions (Addendum)
It was nice meeting you today Derrick Carey!  For your cough, you can begin taking one Tessalon perle up to every 8 hours as needed for cough. You can also try putting extra pillows behind your head when sleeping at night, and drinking warm tea with honey.   For body aches, you can take ibuprofen or Tylenol.   If you have any questions or concerns, please feel free to call the clinic.   Be well,  Dr. Natale MilchLancaster

## 2016-11-26 MED ORDER — BENZONATATE 100 MG PO CAPS
100.0000 mg | ORAL_CAPSULE | Freq: Three times a day (TID) | ORAL | 0 refills | Status: DC | PRN
Start: 2016-11-26 — End: 2018-03-28

## 2016-11-26 NOTE — Telephone Encounter (Signed)
[facility]   Tarri AbernethyAbigail J Orlo Brickle, MD, MPH PGY-2 [facility] Pager [phone]

## 2016-11-26 NOTE — Telephone Encounter (Signed)
LVM for pt to call back to inform him that the Rx had been sent to the requested pharmacy. Derrick Carey, April D, [place]

## 2017-01-17 ENCOUNTER — Encounter (HOSPITAL_BASED_OUTPATIENT_CLINIC_OR_DEPARTMENT_OTHER): Payer: Self-pay

## 2017-01-17 ENCOUNTER — Emergency Department (HOSPITAL_BASED_OUTPATIENT_CLINIC_OR_DEPARTMENT_OTHER)
Admission: EM | Admit: 2017-01-17 | Discharge: 2017-01-17 | Disposition: A | Discharge status: Home / Self Care | Payer: Self-pay | Attending: Emergency Medicine | Admitting: Emergency Medicine

## 2017-01-17 DIAGNOSIS — Z79899 Other long term (current) drug therapy: Secondary | ICD-10-CM | POA: Insufficient documentation

## 2017-01-17 DIAGNOSIS — F909 Attention-deficit hyperactivity disorder, unspecified type: Secondary | ICD-10-CM | POA: Insufficient documentation

## 2017-01-17 DIAGNOSIS — K029 Dental caries, unspecified: Secondary | ICD-10-CM | POA: Insufficient documentation

## 2017-01-17 DIAGNOSIS — K0401 Reversible pulpitis: Secondary | ICD-10-CM | POA: Insufficient documentation

## 2017-01-17 DIAGNOSIS — Z87891 Personal history of nicotine dependence: Secondary | ICD-10-CM | POA: Insufficient documentation

## 2017-01-17 MED ORDER — PENICILLIN V POTASSIUM 500 MG PO TABS: 500.0000 mg | ORAL_TABLET | Freq: Four times a day (QID) | ORAL | 0 refills | Status: AC

## 2017-01-17 MED ORDER — IBUPROFEN 800 MG PO TABS: 800.0000 mg | ORAL_TABLET | Freq: Four times a day (QID) | ORAL | 0 refills | Status: DC | PRN

## 2017-01-17 NOTE — ED Notes (Signed)
Patient c/o left lower jaw pain. Patient states it hurts to swallow a "little bit". Slight swelling noted to lower left jaw.

## 2017-01-17 NOTE — ED Provider Notes (Signed)
[facility] Provider Note   CSN: [id number] Arrival date & time: 01/17/17  1652  By signing my name below, I, Derrick Carey, attest that this documentation has been prepared under the direction and in the presence of physician practitioner, Lyndal Pulley, MD. Electronically Signed: Linna Carey, Scribe. 01/17/2017. 6:38 PM.  History   Chief Complaint Chief Complaint  Patient presents with  . Dental Pain    The history is provided by the patient. No language interpreter was used.  Dental Pain   This is a new problem. The current episode started more than 2 days ago. The problem occurs constantly. The problem has not changed since onset.The pain is moderate. He has tried aspirin for the symptoms. The treatment provided no relief.     HPI Comments: Derrick Carey is a 26 y.o. male with PMHx including poor dentition who presents to the Emergency Department complaining of constant left lower dental pain for three days. No associated symptoms noted. He reports pain exacerbation with applied pressure to his left lower teeth. He has been taking aspirin with no significant improvement of his symptoms. Pt denies fevers, chills, or any other associated symptoms. 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  Past Medical History:  Diagnosis Date  . ADHD (attention deficit hyperactivity disorder)   . Asperger syndrome   . GERD (gastroesophageal reflux disease)     Patient Active Problem List   Diagnosis Date Noted  . Poor dentition 12/02/2015  . Tobacco use disorder 07/23/2015  . Obesity 07/23/2015  . Papule of skin 06/19/2015  . Allergic rhinitis 06/05/2015  . Cough 10/04/2012  . Dry skin 02/08/2012    Class: Acute  . URI (upper respiratory infection) 08/12/2011  . ACNE 02/17/2007  . Attention deficit hyperactivity disorder (ADHD) 12/15/2006    Past Surgical History:  Procedure Laterality Date  . LACERATION REPAIR     in elementary school X 3     Home Medications    Prior to  Admission medications   Medication Sig Start Date End Date Taking? Authorizing Provider  acetaminophen-codeine (TYLENOL #3) 300-30 MG tablet Take 1 tablet by mouth every 4 (four) hours as needed for moderate pain. 11/04/15   Ardith Dark, MD  benzonatate (TESSALON) 100 MG capsule Take 1 capsule (100 mg total) by mouth 3 (three) times daily as needed for cough. 11/26/16   Marquette Saa, MD  cetirizine (ZYRTEC) 10 MG tablet Take 1 tablet (10 mg total) by mouth daily. 09/21/15   Loren Racer, MD  esomeprazole (NEXIUM) 40 MG capsule Take 40 mg by mouth daily.      Historical Provider, MD  fluticasone (FLONASE) 50 MCG/ACT nasal spray Place 2 sprays into both nostrils daily. 11/04/15   Ardith Dark, MD  ibuprofen (ADVIL,MOTRIN) 800 MG tablet Take 1 tablet (800 mg total) by mouth every 6 (six) hours as needed for moderate pain. 01/17/17   Lyndal Pulley, MD  ipratropium (ATROVENT) 0.03 % nasal spray Place 2 sprays into both nostrils every 12 (twelve) hours. 12/01/15   Myra Rude, MD  methylphenidate (RITALIN) 10 MG tablet Take 1 tablet (10 mg total) by mouth every morning. 01/12/16   Tyrone Nine, MD  methylphenidate (RITALIN) 10 MG tablet Take 1 tablet (10 mg total) by mouth every morning. 01/12/16   Tyrone Nine, MD  montelukast (SINGULAIR) 10 MG tablet Take 1 tablet (10 mg total) by mouth at bedtime. 07/05/13   Andrena Mews, DO  penicillin v potassium (VEETID) 500  MG tablet Take 1 tablet (500 mg total) by mouth 4 (four) times daily. 01/17/17 01/27/17  Lyndal Pulley, MD    Family History No family history on file.  Social History Social History  Substance Use Topics  . Smoking status: Former Smoker    Packs/day: 0.50    Years: 1.00    Types: Cigarettes  . Smokeless tobacco: Never Used  . Alcohol use Yes     Comment: occ     Allergies   Sulfa antibiotics   Review of Systems Review of Systems  Constitutional: Negative for chills and fever.  HENT: Positive for dental problem.    All other systems reviewed and are negative.  Physical Exam Updated Vital Signs BP 111/78 (BP Location: Right Arm)   Pulse 84   Temp 98.2 F (36.8 C) (Oral)   Resp 16   Ht  (1.702 m)   Wt 250 lb (113.4 kg)   SpO2 97%   BMI 39.16 kg/m   Physical Exam  Constitutional: He is oriented to person, place, and time. He appears well-developed and well-nourished. No distress.  HENT:  Head: Normocephalic and atraumatic.  Nose: Nose normal.  Mouth/Throat: Dental caries present.  Eyes: Conjunctivae are normal.  Neck: Neck supple. No tracheal deviation present.  Cardiovascular: Normal rate and regular rhythm.   Pulmonary/Chest: Effort normal. No respiratory distress.  Abdominal: Soft. He exhibits no distension.  Neurological: He is alert and oriented to person, place, and time.  Skin: Skin is warm and dry.  Psychiatric: He has a normal mood and affect.   ED Treatments / Results  Labs (all labs ordered are listed, but only abnormal results are displayed) Labs Reviewed - No data to display  EKG  EKG Interpretation None       Radiology No results found.  Procedures Procedures (including critical care time)  DIAGNOSTIC STUDIES: Oxygen Saturation is 97% on RA, normal by my interpretation.    COORDINATION OF CARE: 6:42 PM Discussed treatment plan with pt at bedside and pt agreed to plan.  Medications Ordered in ED Medications - No data to display   Initial Impression / Assessment and Plan / ED Course  I have reviewed the triage vital signs and the nursing notes.  Pertinent labs & imaging results that were available during my care of the patient were reviewed by me and considered in my medical decision making (see chart for details).     26 y.o. male presents with dental pain of bilateral lower teeth. Very poor dentition. Suspect pulpitis without frank abscess.   Final Clinical Impressions(s) / ED Diagnoses  Patient with dentalgia.  No abscess requiring immediate  incision and drainage.  Exam not concerning for Ludwig's angina or pharyngeal abscess.  Will treat with penicillin VK and motrin. Pt instructed to follow-up with dentist.  Discussed return precautions. Pt safe for discharge. Final diagnoses:  Acute pulpitis  Dental caries    New Prescriptions New Prescriptions   IBUPROFEN (ADVIL,MOTRIN) 800 MG TABLET    Take 1 tablet (800 mg total) by mouth every 6 (six) hours as needed for moderate pain.   PENICILLIN V POTASSIUM (VEETID) 500 MG TABLET    Take 1 tablet (500 mg total) by mouth 4 (four) times daily.   I personally performed the services described in this documentation, which was scribed in my presence. The recorded information has been reviewed and is accurate.      Lyndal Pulley, MD 01/18/17 [phone]

## 2017-01-17 NOTE — ED Triage Notes (Signed)
c/o left lower toothache x 3 days-NAD-steady gait

## 2017-04-13 ENCOUNTER — Encounter: Payer: Self-pay | Admitting: Family Medicine

## 2017-04-13 ENCOUNTER — Ambulatory Visit (INDEPENDENT_AMBULATORY_CARE_PROVIDER_SITE_OTHER): Payer: Self-pay | Admitting: Family Medicine

## 2017-04-13 DIAGNOSIS — R05 Cough: Secondary | ICD-10-CM

## 2017-04-13 DIAGNOSIS — R059 Cough, unspecified: Secondary | ICD-10-CM

## 2017-04-13 MED ORDER — CETIRIZINE HCL 10 MG PO TABS: 10.0000 mg | ORAL_TABLET | Freq: Every day | ORAL | 0 refills | Status: DC

## 2017-04-13 MED ORDER — FLUTICASONE PROPIONATE 50 MCG/ACT NA SUSP: 2.0000 | Freq: Every day | NASAL | 1 refills | Status: DC

## 2017-04-13 NOTE — Patient Instructions (Signed)
Cough, Adult Coughing is a reflex that clears your throat and your airways. Coughing helps to heal and protect your lungs. It is normal to cough occasionally, but a cough that happens with other symptoms or lasts a long time may be a sign of a condition that needs treatment. A cough may last only 2-3 weeks (acute), or it may last longer than 8 weeks (chronic). What are the causes? Coughing is commonly caused by:  Breathing in substances that irritate your lungs.  A viral or bacterial respiratory infection.  Allergies.  Asthma.  Postnasal drip.  Smoking.  Acid backing up from the stomach into the esophagus (gastroesophageal reflux).  Certain medicines.  Chronic lung problems, including COPD (or rarely, lung cancer).  Other medical conditions such as heart failure.  Follow these instructions at home: Pay attention to any changes in your symptoms. Take these actions to help with your discomfort:  Take medicines only as told by your health care provider. ? If you were prescribed an antibiotic medicine, take it as told by your health care provider. Do not stop taking the antibiotic even if you start to feel better. ? Talk with your health care provider before you take a cough suppressant medicine.  Drink enough fluid to keep your urine clear or pale yellow.  If the air is dry, use a cold steam vaporizer or humidifier in your bedroom or your home to help loosen secretions.  Avoid anything that causes you to cough at work or at home.  If your cough is worse at night, try sleeping in a semi-upright position.  Avoid cigarette smoke. If you smoke, quit smoking. If you need help quitting, ask your health care provider.  Avoid caffeine.  Avoid alcohol.  Rest as needed.  Contact a health care provider if:  You have new symptoms.  You cough up pus.  Your cough does not get better after 2-3 weeks, or your cough gets worse.  You cannot control your cough with suppressant  medicines and you are losing sleep.  You develop pain that is getting worse or pain that is not controlled with pain medicines.  You have a fever.  You have unexplained weight loss.  You have night sweats. Get help right away if:  You cough up blood.  You have difficulty breathing.  Your heartbeat is very fast. This information is not intended to replace advice given to you by your health care provider. Make sure you discuss any questions you have with your health care provider. Document Released: 04/02/2011 Document Revised: 03/11/2016 Document Reviewed: 12/11/2014 Elsevier Interactive Patient Education  2017 Elsevier Inc.  

## 2017-04-13 NOTE — Progress Notes (Signed)
Subjective:     Patient ID: Derrick Carey, male   DOB: [birth year], 26 y.o.   MRN: [id number]  HPI Mr. Derrick Carey is a 26 year old male presenting today for sore throat, headache, cough, and ear fullness. Has noted productive cough since Sunday. First noted ear fullness on the right last night. Also reports postnasal drip and having to clear his throat frequently. Headache is frontal. Denies fever. Has been using over-the-counter cough syrup with some relief. History of allergies noted. Reports this feels similar to previous flares of allergies. Previously prescribed Zyrtec and Flonase, however he no longer has these available. Requests work note for yesterday.  Review of Systems Per HPI    Objective:   Physical Exam  Constitutional: He appears well-developed and well-nourished. No distress.  HENT:  Head: Normocephalic and atraumatic.  Mouth/Throat: Oropharynx is clear and moist.  Tympanic membranes normal with normal aeration of middle ears. Sinuses equal to elimination. No sinus tenderness noted.  Cardiovascular: Normal rate and regular rhythm.   No murmur heard. Pulmonary/Chest: Effort normal. No respiratory distress. He has no wheezes.  Abdominal: Soft. He exhibits no distension. There is no tenderness.  Musculoskeletal: He exhibits no edema.  Skin: No rash noted.  Psychiatric: He has a normal mood and affect. His behavior is normal.       Assessment and Plan:     1. Cough Most likely secondary to postnasal drip. Zyrtec and Flonase refill. No indications for antibiotics. Follow-up as needed.

## 2017-09-05 ENCOUNTER — Encounter: Payer: Self-pay | Admitting: Family Medicine

## 2017-09-05 ENCOUNTER — Ambulatory Visit (INDEPENDENT_AMBULATORY_CARE_PROVIDER_SITE_OTHER): Payer: Self-pay | Admitting: Family Medicine

## 2017-09-05 ENCOUNTER — Other Ambulatory Visit: Payer: Self-pay

## 2017-09-05 ENCOUNTER — Telehealth: Payer: Self-pay | Admitting: Internal Medicine

## 2017-09-05 DIAGNOSIS — J0110 Acute frontal sinusitis, unspecified: Secondary | ICD-10-CM

## 2017-09-05 DIAGNOSIS — R05 Cough: Secondary | ICD-10-CM

## 2017-09-05 DIAGNOSIS — R059 Cough, unspecified: Secondary | ICD-10-CM

## 2017-09-05 MED ORDER — AMOXICILLIN-POT CLAVULANATE 875-125 MG PO TABS: 1.0000 | ORAL_TABLET | Freq: Two times a day (BID) | ORAL | 0 refills | Status: DC

## 2017-09-05 MED ORDER — FLUTICASONE PROPIONATE 50 MCG/ACT NA SUSP: 2.0000 | Freq: Every day | NASAL | 1 refills | Status: DC

## 2017-09-05 MED ORDER — CETIRIZINE HCL 10 MG PO TABS: 10.0000 mg | ORAL_TABLET | Freq: Every day | ORAL | 0 refills | Status: DC

## 2017-09-05 NOTE — Patient Instructions (Signed)

## 2017-09-05 NOTE — Telephone Encounter (Signed)
After Hours Emergency Line:  Received a call from Dantavious's mother stating that he had oral surgery 3 weeks ago where he had a lot of teeth removed in the back of his mouth. He was in a lot of pain initially, but this got better. He woke up yesterday morning and his face was swollen. He tried using Ocean nasal spray, which didn't help. He had a prescription for Amoxicillin at the pharmacy, which they picked up yesterday. He has take a couple of tablets, but this hasn't helped. He also had pain yesterday and mom has been giving him Ibuprofen for this. He has been feeling both hot and cold, but his mom has not checked his temperature because they do not have a thermometer. Mom is requesting a same day appointment to be seen- appointment has been made with Dr. Jonathon JordanGambino at 2:10pm.  Will forward to PCP and physician that will be seeing patient today.  Willadean CarolKaty Jhace Fennell, MD PGY-3

## 2017-09-05 NOTE — Progress Notes (Signed)
Subjective:    Patient ID: Derrick Carey , male   DOB: 11/17/1990 , 26 y.o..   MRN: [id number]  HPI  Derrick Carey is here for  Chief Complaint  Patient presents with  . Facial Pain    1. Facial Pain/sinus congestion: Patient is coming in today with his father.   He notes that about 3 weeks ago he had all of his teeth but 8 extracted due to periodontal disease.Marland Kitchen.  He had some facial swelling after that surgery at the doctor's office but has been gradually improving. Yesterday he thought that the swelling came back. He mostly has pain over his frontal sinus area bilaterally and the pain in his mouth has improved.  He has a history of sinus infections.  He notes that he has mucus draining down the back of his throat for the last week and a half.  Admits to a subjective fever, congestion, postnasal drip.  Due to his symptoms he refilled an old prescription that he had for amoxicillin and started taking that yesterday and is already feeling a little bit better.  He used to take Zyrtec but does not anymore.  He does use normal saline in his nose from time to time.  He notes that around this time of year his symptoms typically get worse. Denies any cough, congestion, wheezing, shortness of breath, chest pain, nausea, vomiting, diarrhea.  Review of Systems: Per HPI.   Health Maintenance Due  Topic Date Due  . INFLUENZA VACCINE  05/18/2017    Past Medical History: Patient Active Problem List   Diagnosis Date Noted  . Poor dentition 12/02/2015  . Tobacco use disorder 07/23/2015  . Obesity 07/23/2015  . Papule of skin 06/19/2015  . Allergic rhinitis 06/05/2015  . Sinusitis, acute frontal 11/26/2013  . Cough 10/04/2012  . Dry skin 02/08/2012    Class: Acute  . URI (upper respiratory infection) 08/12/2011  . ACNE 02/17/2007  . Attention deficit hyperactivity disorder (ADHD) 12/15/2006    Medications: reviewed   Social Hx:  reports that he has quit smoking. His smoking use included  cigarettes. He has a 0.50 pack-year smoking history. he has never used smokeless tobacco.   Objective:   BP 124/72   Pulse 76   Temp 97.9 F (36.6 C) (Oral)   Ht 5\' 11"  (1.803 m)   Wt 256 lb 6.4 oz (116.3 kg)   SpO2 97%   BMI 35.76 kg/m  Physical Exam  Gen: NAD, alert, cooperative with exam, well-appearing HEENT:     Head: Normocephalic, atraumatic.  Tender to palpation over frontal sinuses bilaterally    Neck: No masses palpated. No goiter. No lymphadenopathy     Ears: External ears normal, no drainage.Tympanic membranes intact, normal light reflex bilaterally, no erythema or bulging    Eyes: PERRLA, EOMI, sclera white, normal conjunctiva    Nose: nasal turbinates moist, nasal mucosa swollen bilaterally     Mouth: Several missing teeth, no areas of erythema or fluctuance on gingiva, metal implant seen in maxillary area where front tooth should be    Throat: moist mucus membranes, no pharyngeal erythema, no tonsillar exudate. Airway is patent Cardiac: Regular rate and rhythm, normal S1/S2, no murmur, no edema, capillary refill brisk  Respiratory: Clear to auscultation bilaterally, no wheezes, non-labored breathing Neurological: no gross deficits.  Psych: good insight, normal mood and affect  Assessment & Plan:  Sinusitis, acute frontal History of prolonged congestion and sinus pressure for the last couple weeks.  Also  tender to palpation in frontal sinuses.  Reassuringly afebrile and systemic symptoms.  Has a history of allergies and per his report previous sinus infections.  Likely has chronic sinusitis.  -Augmentin 875-125 mg p.o. twice daily x10 day course -Zyrtec 10 mg daily - Flonase - Continue nasal saline as well - Tylenol as needed for sinus pain -Discussed given his recurrent symptoms and difficulty breathing through his nose he may benefit from ENT referral, patient prefers to just see PCP -Follow-up in 1 week to ensure improvement of symptoms   Meds ordered this  encounter  Medications  . cetirizine (ZYRTEC) 10 MG tablet    Sig: Take 1 tablet (10 mg total) daily by mouth.    Dispense:  60 tablet    Refill:  0  . fluticasone (FLONASE) 50 MCG/ACT nasal spray    Sig: Place 2 sprays daily into both nostrils.    Dispense:  16 g    Refill:  1  . amoxicillin-clavulanate (AUGMENTIN) 875-125 MG tablet    Sig: Take 1 tablet 2 (two) times daily by mouth.    Dispense:  20 tablet    Refill:  0    Anders Simmondshristina Keaun Schnabel, MD [facility], PGY-3

## 2017-09-11 NOTE — Assessment & Plan Note (Addendum)
History of prolonged congestion and sinus pressure for the last couple weeks.  Also tender to palpation in frontal sinuses.  Reassuringly afebrile and systemic symptoms.  Has a history of allergies and per his report previous sinus infections.  Likely has chronic sinusitis.  Additionally recently had several teeth pulled at the dental office, no signs of active infection inside the oral cavity. -Augmentin 875-125 mg p.o. twice daily x10 day course -Zyrtec 10 mg daily - Flonase - Continue nasal saline as well - Tylenol as needed for sinus pain -Discussed given his recurrent symptoms and difficulty breathing through his nose he may benefit from ENT referral, patient prefers to just see PCP -Follow-up in 1 week to ensure improvement of symptoms

## 2017-09-12 ENCOUNTER — Encounter: Payer: Self-pay | Admitting: Family Medicine

## 2017-09-12 ENCOUNTER — Ambulatory Visit (INDEPENDENT_AMBULATORY_CARE_PROVIDER_SITE_OTHER): Payer: Self-pay | Admitting: Family Medicine

## 2017-09-12 DIAGNOSIS — J0110 Acute frontal sinusitis, unspecified: Secondary | ICD-10-CM

## 2017-09-12 DIAGNOSIS — R05 Cough: Secondary | ICD-10-CM

## 2017-09-12 DIAGNOSIS — R059 Cough, unspecified: Secondary | ICD-10-CM

## 2017-09-12 MED ORDER — CETIRIZINE HCL 10 MG PO TABS: 10.0000 mg | ORAL_TABLET | Freq: Every day | ORAL | 0 refills | Status: DC

## 2017-09-12 MED ORDER — GUAIFENESIN-CODEINE 100-10 MG/5ML PO SOLN: 10.0000 mL | Freq: Every day | ORAL | 0 refills | Status: DC

## 2017-09-12 MED FILL — CHERATUSSIN AC SYRUP: 100-10 | 12 days supply | Qty: 120 | Fill #0

## 2017-09-12 NOTE — Patient Instructions (Addendum)
Thank you for coming in today, it was so nice to see you! Today we talked about:    Cough: This is likely a virus, possibly the flu. Continue drinking warm tea with honey. You can use the cough medicine that I prescribed you at nighttime.  This will make you sleepy.  Please take to the Zyrtec medication that I have prescribed for you as well as this will help the drainage in your sinuses  Please come back to the clinic if you are not feeling better within 5 days or start to feel worse   Please go to the emergency department if you have any trouble breathing, wheezing, chest pain  If you have any questions or concerns, please do not hesitate to call the office at [phone]. You can also message me directly via MyChart.   Sincerely,  Anders Simmondshristina Carlena Ruybal, MD

## 2017-09-12 NOTE — Progress Notes (Signed)
Subjective:    Patient ID: Derrick Carey , male   DOB: [birth year] , 26 y.o..   MRN: [id number]  HPI  Derrick Brightlydam C Granda is here for   1. Sinuitis follow up: Patient states that since he was seen last his sinus pain has improved slightly.  He has not picked up the Zyrtec prescription from the pharmacy but he did start taking the antibiotic.  He notes that he thinks antibiotic has helped a little bit.  He notes that over the last 3 days he has developed a cough associated with myalgias and malaise.  He denies any shortness of breath or chest pain.  His appetite has been good.  Admits to subjective fevers at home.  Has not taken any ibuprofen or Tylenol for this.  Notes that he continues to have some drainage into the back of his throat and has some itchiness of the skin around his throat.  Denies any rash however.  No sick contacts.  Review of Systems: Per HPI.   Past Medical History: Patient Active Problem List   Diagnosis Date Noted  . Poor dentition 12/02/2015  . Tobacco use disorder 07/23/2015  . Obesity 07/23/2015  . Papule of skin 06/19/2015  . Allergic rhinitis 06/05/2015  . Sinusitis, acute frontal 11/26/2013  . Cough 10/04/2012  . Dry skin 02/08/2012    Class: Acute  . URI (upper respiratory infection) 08/12/2011  . ACNE 02/17/2007  . Attention deficit hyperactivity disorder (ADHD) 12/15/2006    Medications: reviewed and updated Current Outpatient Medications  Medication Sig Dispense Refill  . acetaminophen-codeine (TYLENOL #3) 300-30 MG tablet Take 1 tablet by mouth every 4 (four) hours as needed for moderate pain. 30 tablet 0  . amoxicillin-clavulanate (AUGMENTIN) 875-125 MG tablet Take 1 tablet 2 (two) times daily by mouth. 20 tablet 0  . benzonatate (TESSALON) 100 MG capsule Take 1 capsule (100 mg total) by mouth 3 (three) times daily as needed for cough. 21 capsule 0  . cetirizine (ZYRTEC) 10 MG tablet Take 1 tablet (10 mg total) by mouth daily. 60 tablet 0  .  esomeprazole (NEXIUM) 40 MG capsule Take 40 mg by mouth daily.      . fluticasone (FLONASE) 50 MCG/ACT nasal spray Place 2 sprays daily into both nostrils. 16 g 1  . guaiFENesin-codeine 100-10 MG/5ML syrup Take 10 mLs by mouth at bedtime. 120 mL 0  . ibuprofen (ADVIL,MOTRIN) 800 MG tablet Take 1 tablet (800 mg total) by mouth every 6 (six) hours as needed for moderate pain. 21 tablet 0  . ipratropium (ATROVENT) 0.03 % nasal spray Place 2 sprays into both nostrils every 12 (twelve) hours. 30 mL 12  . methylphenidate (RITALIN) 10 MG tablet Take 1 tablet (10 mg total) by mouth every morning. 30 tablet 0  . methylphenidate (RITALIN) 10 MG tablet Take 1 tablet (10 mg total) by mouth every morning. 30 tablet 0  . montelukast (SINGULAIR) 10 MG tablet Take 1 tablet (10 mg total) by mouth at bedtime. 30 tablet 3   No current facility-administered medications for this visit.     Social Hx:  reports that he has quit smoking. His smoking use included cigarettes. He has a 0.50 pack-year smoking history. he has never used smokeless tobacco.   Objective:   BP 115/75 (BP Location: Left Arm, Patient Position: Sitting, Cuff Size: Normal)   Pulse 76   Temp 98.2 F (36.8 C) (Oral)   Wt 252 lb 12.8 oz (114.7 kg)   SpO2 97%  BMI 35.26 kg/m  Physical Exam  Gen: NAD, alert, cooperative with exam, well-appearing HEENT:     Head: Normocephalic, atraumatic.  Mildly tender to palpation over frontal sinuses bilaterally Neck: No masses palpated. No goiter. No lymphadenopathy  Ears: External ears normal, no drainage.Tympanic membranes intact, normal light reflex bilaterally, no erythema or bulging Eyes: PERRLA, EOMI, sclera white, normal conjunctiva Nose: nasal turbinates moist, nasal mucosa swollen bilaterally     Mouth: Several missing teeth, no areas of erythema or fluctuance on gingiva, metal implant seen in maxillary area where front tooth should be Throat: moist mucus membranes, no  pharyngeal erythema, notonsillar exudate. Airway is patent Cardiac: Regular rate and rhythm, normal S1/S2, no murmur, no edema, capillary refill brisk  Respiratory: Clear to auscultation bilaterally, no wheezes, non-labored breathing.  Coughs intermittently through exam Neurological: no gross deficits.  Psych: good insight, normal mood and affect  Assessment & Plan:  Sinusitis, acute frontal Congestion and sinus pressure improving.  Patient still afebrile without systemic symptoms.  Notes that the cough is what is bothering him the most as it is preventing him from sleeping at times.  Suspect that this cough is likely secondary to postnasal drip.  He has not been using the Flonase consistently but has been taking Augmentin as prescribed.  He has not yet taken Zyrtec. -Continue Augmentin 875-125 mg p.o. twice daily to complete the 10-day course -Encouraged Flonase use -Printed new prescription for Zyrtec 10 mg daily - Continue nasal saline - Prescription for Robitussin cough syrup to be used at night before bed for the next 4-5 days -Return precautions discussed  Meds ordered this encounter  Medications  . guaiFENesin-codeine 100-10 MG/5ML syrup    Sig: Take 10 mLs by mouth at bedtime.    Dispense:  120 mL    Refill:  0  . cetirizine (ZYRTEC) 10 MG tablet    Sig: Take 1 tablet (10 mg total) by mouth daily.    Dispense:  60 tablet    Refill:  0    Anders Simmondshristina Holden Draughon, MD [facility], PGY-3

## 2017-09-12 NOTE — Assessment & Plan Note (Signed)
Congestion and sinus pressure improving.  Patient still afebrile without systemic symptoms.  Notes that the cough is what is bothering him the most as it is preventing him from sleeping at times.  Suspect that this cough is likely secondary to postnasal drip.  He has not been using the Flonase consistently but has been taking Augmentin as prescribed.  He has not yet taken Zyrtec. -Continue Augmentin 875-125 mg p.o. twice daily to complete the 10-day course -Encouraged Flonase use -Printed new prescription for Zyrtec 10 mg daily - Continue nasal saline - Prescription for Robitussin cough syrup to be used at night before bed for the next 4-5 days -Return precautions discussed

## 2017-11-15 IMAGING — DX DG CHEST 2V
2 series · 2 of 2 positions shown · non-contrast
Comparison: 08/19/2011 radiographs.

CLINICAL DATA: Productive cough with sore throat and fever since
yesterday. Initial encounter.

EXAM:
CHEST  2 VIEW

[chest pa]
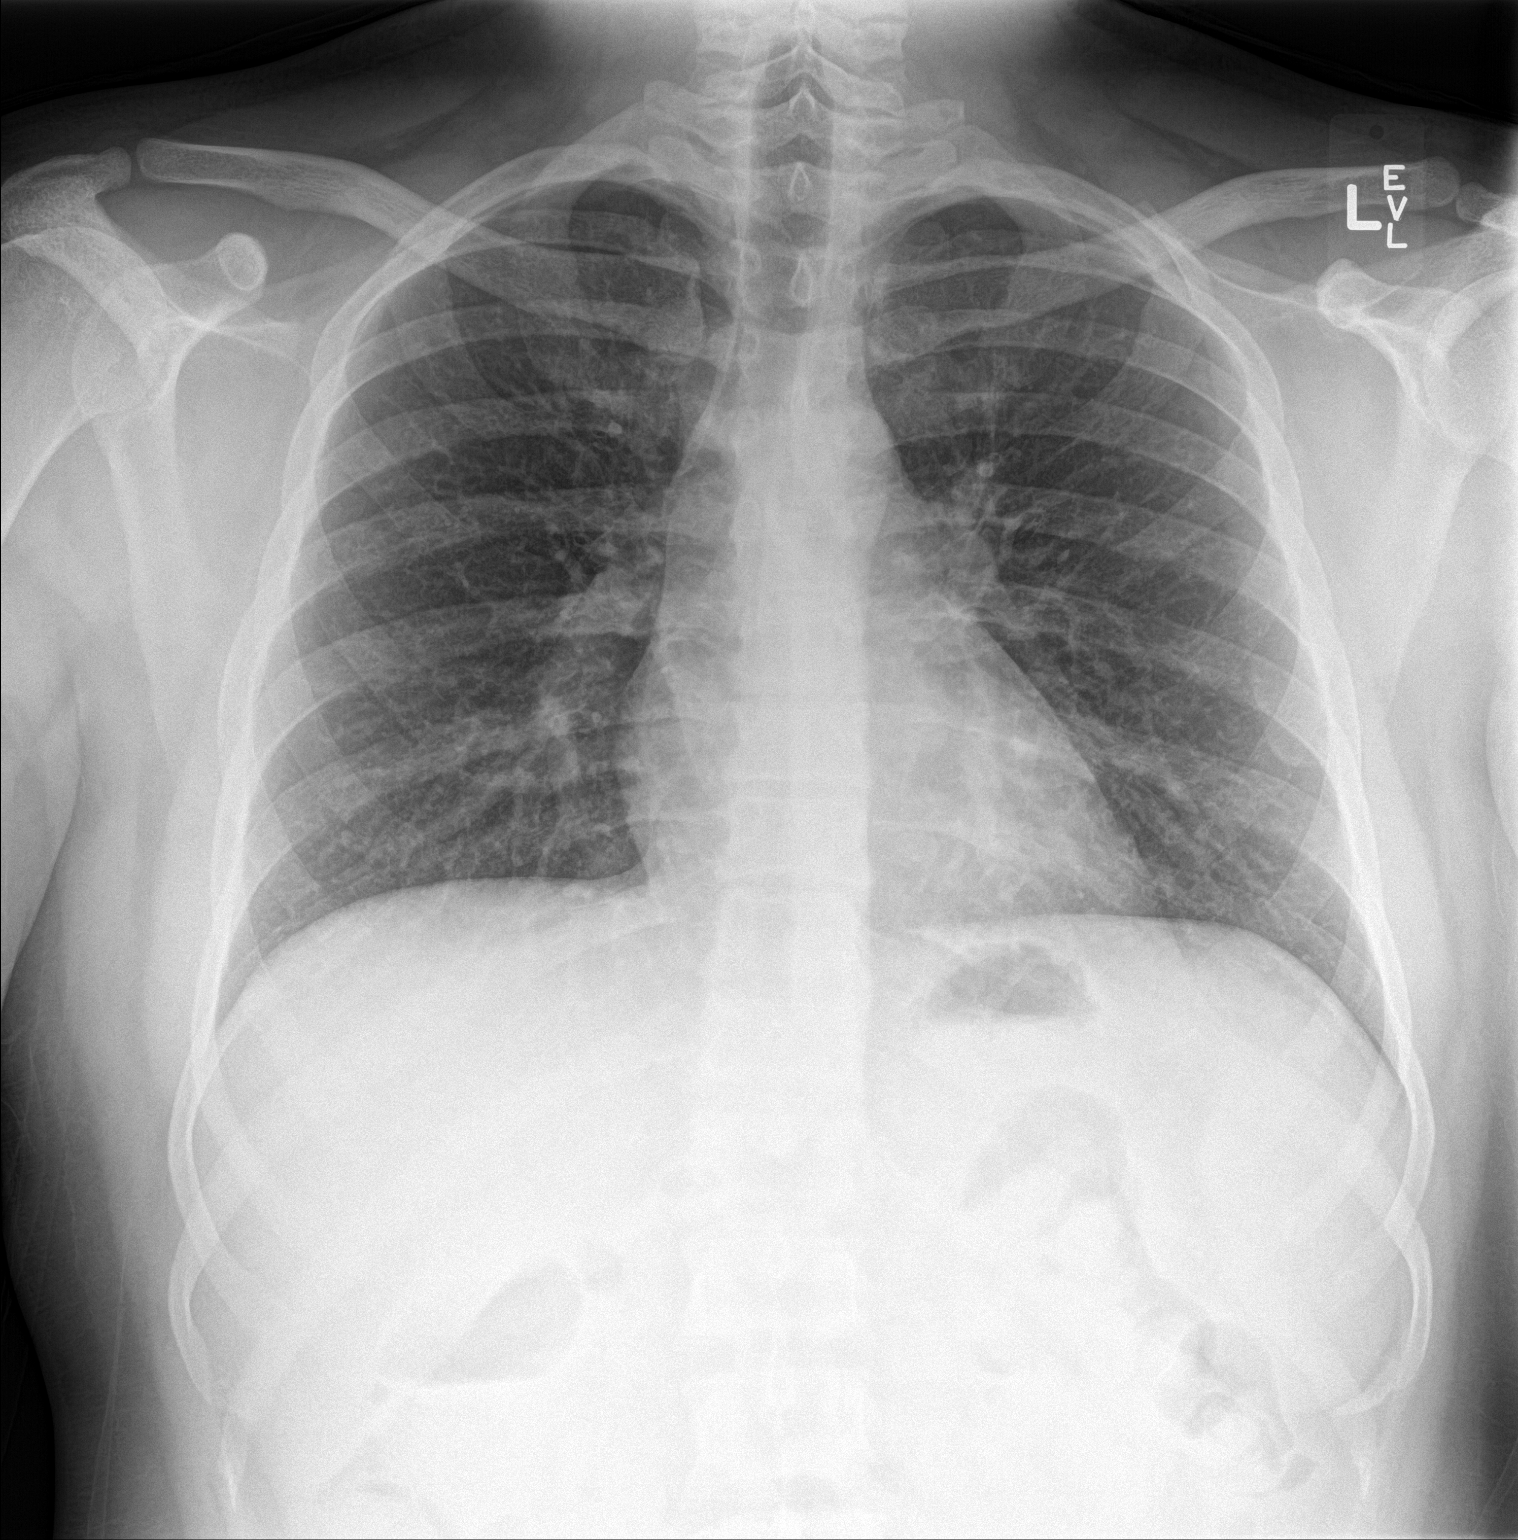

[chest lat]
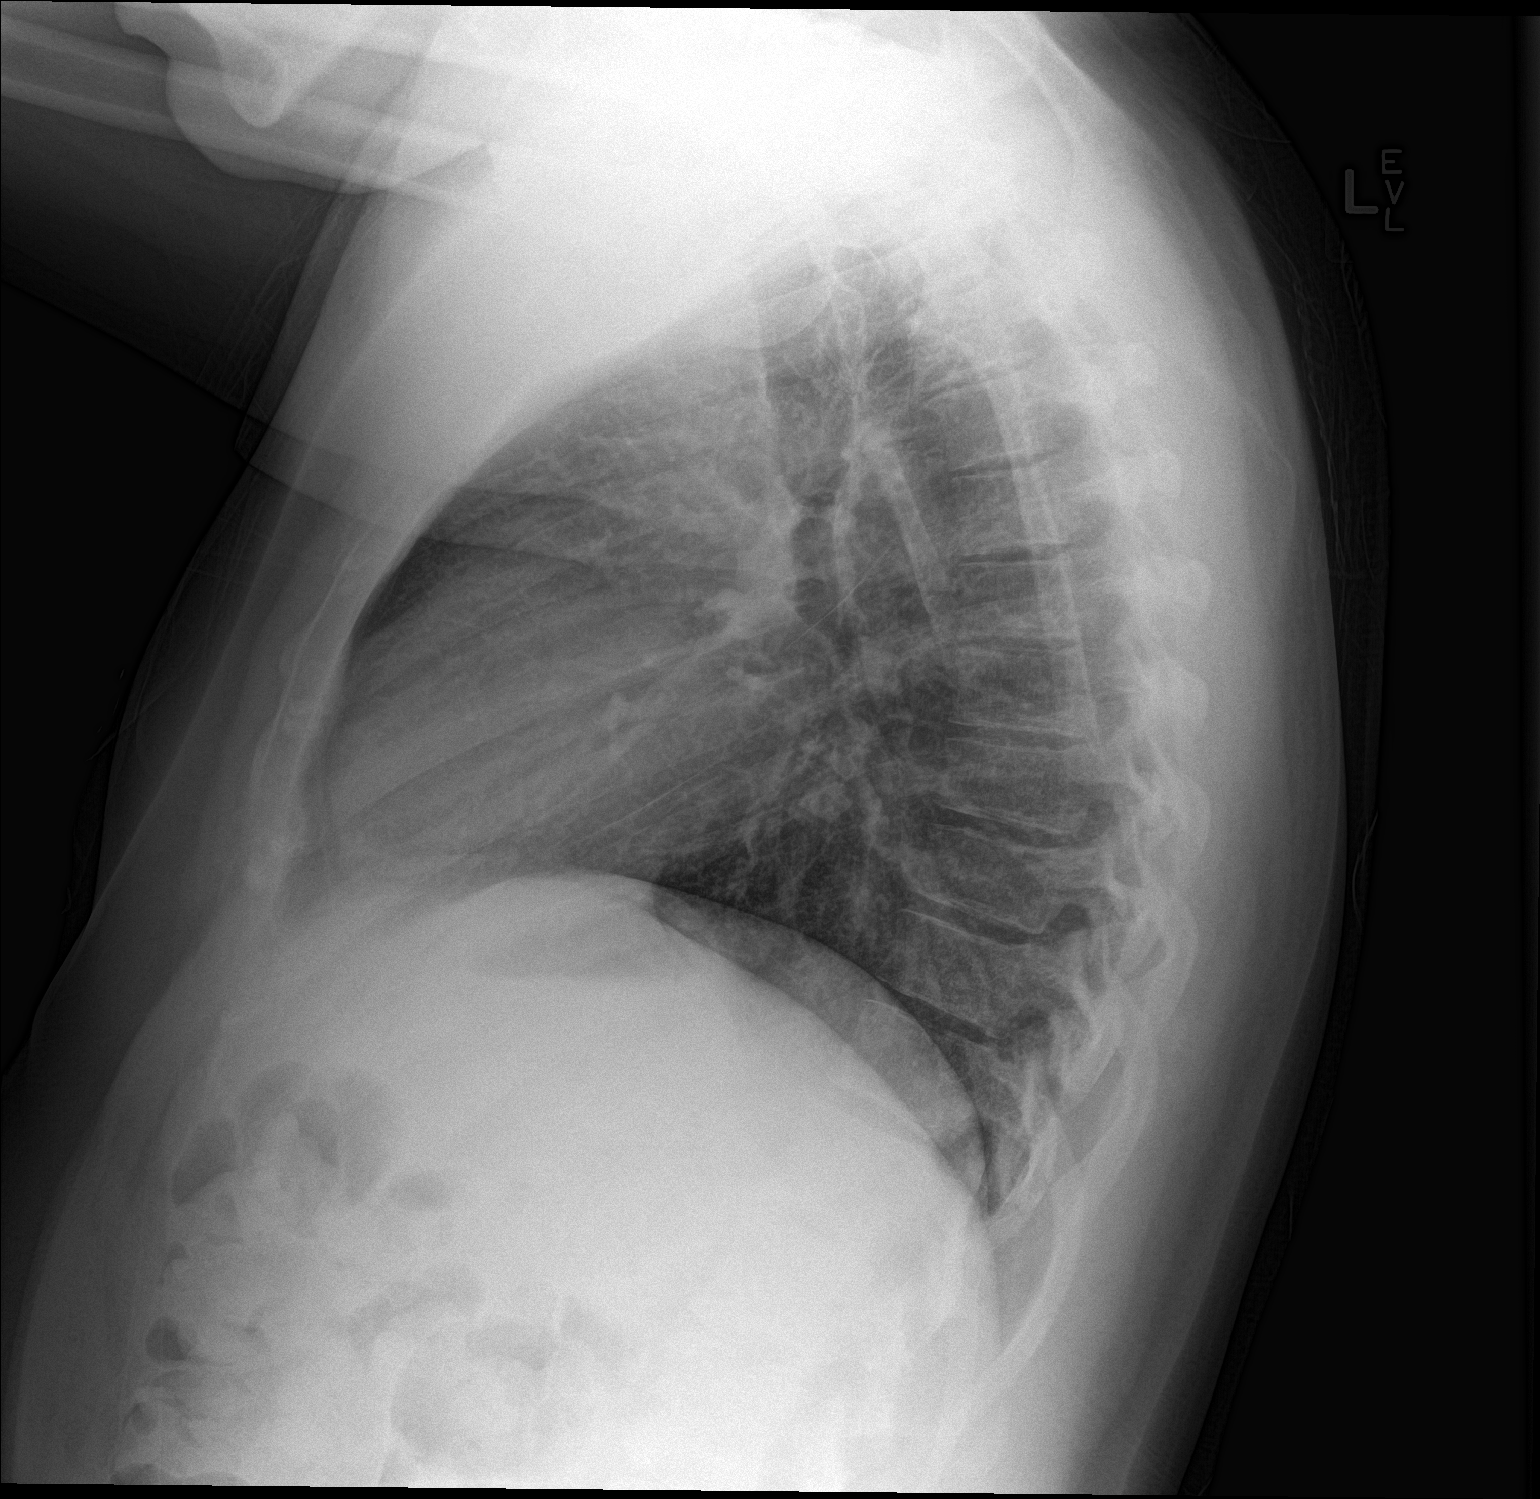

[2 of 2 positions shown; findings below may reference images not displayed]

FINDINGS: The heart size and mediastinal contours are normal. The lungs are
clear. There is no pleural effusion or pneumothorax. No acute
osseous findings are identified.
IMPRESSION: Stable chest.  No active cardiopulmonary process.

## 2018-03-27 NOTE — Progress Notes (Signed)
   [facility] Phone: [phone]   Date of Visit: 03/28/2018   HPI:  Ear Pain:  - reports of having chills, body aches, and dry cough which started last Wednesday. Also reports of post nasal drip but no rhinorrhea. Reports he maybe has a slight sore throat. On Friday started having some nausea with bilateral ear ache. Reports of emesis x 1 (nonbloody).  - he still has some achiness and intermittent nausea and post nasal drip. No fevers. No sick contacts. Reports he feels off balance. No falls.  - reports of chronic intermittent ringing in his ear  - has tried Delsum and ibuprofen for symptoms; unsure if this helps.   ROS: See HPI.  PMFSH:  PMH: ADHD Acne Obesity Tobacco Use DO  PHYSICAL EXAM: BP 124/82   Pulse 71   Temp 98.4 F (36.9 C) (Oral)   Ht 6' 0.75" (1.848 m)   Wt 254 lb (115.2 kg)   SpO2 96%   BMI 33.74 kg/m  GEN: NAD HEENT: Atraumatic, normocephalic, neck supple, EOMI, sclera clear. Pharynx normal. No maxillary teeth. TMs with clear effusion but no bulging or erythema. Very mild erythema of the distal ear canals bilaterally.  CV: RRR, no murmurs, rubs, or gallops PULM: CTAB, normal effort ABD: Soft, nontender, nondistended, NABS, no organomegaly SKIN: No rash or cyanosis; warm and well-perfused EXTR: No lower extremity edema or calf tenderness PSYCH: Mood and affect euthymic, normal rate and volume of speech NEURO: Awake, alert, CN 2-12 normal. Normal strength in upper and lower extremities, normal sensation to light touch bilaterally in upper and lower extremities bilaterally, normal speech. Normal gait. [place]omberg normal.    ASSESSMENT/PLAN:  1. Otitis media with effusion, bilateral 2. Acute noninfective otitis externa of both ears, unspecified type His symptoms are likely related to viral illness. Father reports that patient has a history of tubes in his ears; he may have a degree of eustachian tube dysfunction. Unlikely this is a  bacterial infection. Patient is well appearing with stable vitals and afebrile. He has Flonase and Zyrtec on his medication list but reports he "only uses when he needs it and also when he can afford it". Recommend starting Flonase again to help with post nasal drip and otitis media with effusion. Would also recommend starting Zyrtec. Provided Rx for [facility] otic drops to help with the mild inflammation in the ear canal if he desires. No sings of mastoiditis either. Return precautions discussed.   Palma HolterKanishka G Daphanie Oquendo, MD PGY 3 [facility]

## 2018-03-28 ENCOUNTER — Encounter: Payer: Self-pay | Admitting: Internal Medicine

## 2018-03-28 ENCOUNTER — Ambulatory Visit (INDEPENDENT_AMBULATORY_CARE_PROVIDER_SITE_OTHER): Payer: Self-pay | Admitting: Internal Medicine

## 2018-03-28 ENCOUNTER — Other Ambulatory Visit: Payer: Self-pay

## 2018-03-28 VITALS — BP 124/82 | HR 71 | Temp 98.4°F | Ht 72.75 in | Wt 254.0 lb

## 2018-03-28 DIAGNOSIS — H6593 Unspecified nonsuppurative otitis media, bilateral: Secondary | ICD-10-CM

## 2018-03-28 DIAGNOSIS — H60503 Unspecified acute noninfective otitis externa, bilateral: Secondary | ICD-10-CM

## 2018-03-28 MED ORDER — HYDROCORTISONE-ACETIC ACID 1-2 % OT SOLN: 3.0000 [drp] | Freq: Three times a day (TID) | OTIC | 0 refills | Status: AC

## 2018-03-28 MED ORDER — CETIRIZINE HCL 10 MG PO TABS: 10.0000 mg | ORAL_TABLET | Freq: Every day | ORAL | 0 refills | Status: DC

## 2018-03-28 MED ORDER — FLUTICASONE PROPIONATE 50 MCG/ACT NA SUSP: 2.0000 | Freq: Every day | NASAL | 1 refills | Status: DC

## 2018-03-28 NOTE — Patient Instructions (Signed)
I do not think you have a bacterial ear infection. There is some fluid behind your ear drum and mild inflammation of the ear canals.  1) start taking Flonase every day 2) start Zyrtec 3) try the ear drop solution if you are able to afford  Follow up in 1 week if symptoms persist or sooner if they worsen.

## 2018-03-29 ENCOUNTER — Encounter: Payer: Self-pay | Admitting: Internal Medicine

## 2018-04-10 ENCOUNTER — Telehealth: Payer: Self-pay | Admitting: Family Medicine

## 2018-04-10 ENCOUNTER — Other Ambulatory Visit: Payer: Self-pay

## 2018-04-10 ENCOUNTER — Encounter (HOSPITAL_BASED_OUTPATIENT_CLINIC_OR_DEPARTMENT_OTHER): Payer: Self-pay | Admitting: *Deleted

## 2018-04-10 ENCOUNTER — Emergency Department (HOSPITAL_BASED_OUTPATIENT_CLINIC_OR_DEPARTMENT_OTHER)
Admission: EM | Admit: 2018-04-10 | Discharge: 2018-04-10 | Disposition: A | Discharge status: Home / Self Care | Payer: Self-pay | Attending: Emergency Medicine | Admitting: Emergency Medicine

## 2018-04-10 DIAGNOSIS — H9203 Otalgia, bilateral: Secondary | ICD-10-CM | POA: Insufficient documentation

## 2018-04-10 DIAGNOSIS — F845 Asperger's syndrome: Secondary | ICD-10-CM | POA: Insufficient documentation

## 2018-04-10 DIAGNOSIS — Z87891 Personal history of nicotine dependence: Secondary | ICD-10-CM | POA: Insufficient documentation

## 2018-04-10 DIAGNOSIS — Z79899 Other long term (current) drug therapy: Secondary | ICD-10-CM | POA: Insufficient documentation

## 2018-04-10 MED ORDER — AMOXICILLIN-POT CLAVULANATE 875-125 MG PO TABS: 1.0000 | ORAL_TABLET | Freq: Two times a day (BID) | ORAL | 0 refills | Status: DC

## 2018-04-10 MED ORDER — NAPROXEN 250 MG PO TABS
500.0000 mg | ORAL_TABLET | Freq: Once | ORAL | Status: AC
  Administered 2018-04-10: 500 mg via ORAL
  Filled 2018-04-10: qty 2

## 2018-04-10 NOTE — ED Triage Notes (Signed)
Pt and mother state he had similar issues with his ears x 10 days ago. [facility] Placed on ear drops, Flonase, Zyrtec. Helped for about 3 days. Now s/s have returned onset 1a. Tried Advil with some relief at 2200. Also tried "peroxide and a shower". Ear started throbbing and became more sensitive to noise. Also some dizziness and nausea as well.

## 2018-04-10 NOTE — ED Notes (Signed)
Pt and mother given d/c instructions as per chart. rx x 1. Verbalizes understanding. No questions.

## 2018-04-10 NOTE — ED Notes (Signed)
Pt triaged by this RN 

## 2018-04-10 NOTE — Discharge Instructions (Addendum)
You were seen today for earache.  Continue Zyrtec and Flonase.  You will be given an antibiotic as it appears you may have an infection in the right ear.  Follow-up with your primary doctor.

## 2018-04-10 NOTE — ED Notes (Signed)
ED Provider at bedside. 

## 2018-04-10 NOTE — ED Provider Notes (Signed)
[facility] EMERGENCY DEPARTMENT Provider Note   CSN: [id number] Arrival date & time: 04/10/18  0244     History   Chief Complaint Chief Complaint  Patient presents with  . Otalgia    HPI Derrick Carey is a 27 y.o. male.  HPI  This is a 27 year old male with Asperger syndrome who presents with bilateral otalgia.  Reports 10-day history of shooting pains in the bilateral ears.  He did see his primary for vision.  He was placed on eardrops, Flonase, and Zyrtec.  He states that he has been taking these as directed.  Initially he felt like this helped.  However, symptoms worsened tonight.  He last took Advil at 10 PM with minimal relief.  Currently he rates his pain at 5 out of 10.  He used peroxide but this did not seem to help.  He denies sticking anything in his ears.  He states he does have some upper respiratory symptoms initially.  Denies any fevers.  Reports pain is now throbbing and he is more sensitive to noise.  Reports that he has not been in a swimming pool in the last 3 years.  Past Medical History:  Diagnosis Date  . ADHD (attention deficit hyperactivity disorder)   . Asperger syndrome   . GERD (gastroesophageal reflux disease)     Patient Active Problem List   Diagnosis Date Noted  . Poor dentition 12/02/2015  . Tobacco use disorder 07/23/2015  . Obesity 07/23/2015  . Papule of skin 06/19/2015  . Allergic rhinitis 06/05/2015  . Sinusitis, acute frontal 11/26/2013  . Cough 10/04/2012  . Dry skin 02/08/2012    Class: Acute  . URI (upper respiratory infection) 08/12/2011  . ACNE 02/17/2007  . Attention deficit hyperactivity disorder (ADHD) 12/15/2006    Past Surgical History:  Procedure Laterality Date  . LACERATION REPAIR     in elementary school X 3        Home Medications    Prior to Admission medications   Medication Sig Start Date End Date Taking? Authorizing Provider  amoxicillin-clavulanate (AUGMENTIN) 875-125 MG tablet Take 1  tablet by mouth every 12 (twelve) hours. 04/10/18   Makilah Dowda, Mayer Maskerourtney F, MD  cetirizine (ZYRTEC) 10 MG tablet Take 1 tablet (10 mg total) by mouth daily. 03/28/18   Palma HolterGunadasa, Kanishka G, MD  esomeprazole (NEXIUM) 40 MG capsule Take 40 mg by mouth daily.      [provider]  fluticasone (FLONASE) 50 MCG/ACT nasal spray Place 2 sprays into both nostrils daily. 03/28/18   Palma HolterGunadasa, Kanishka G, MD  guaiFENesin-codeine 100-10 MG/5ML syrup Take 10 mLs by mouth at bedtime. 09/12/17   Beaulah DinningGambino, Christina M, MD  ibuprofen (ADVIL,MOTRIN) 800 MG tablet Take 1 tablet (800 mg total) by mouth every 6 (six) hours as needed for moderate pain. 01/17/17   Lyndal PulleyKnott, Daniel, MD  ipratropium (ATROVENT) 0.03 % nasal spray Place 2 sprays into both nostrils every 12 (twelve) hours. Patient not taking: Reported on 03/28/2018 12/01/15   Myra RudeSchmitz, Jeremy E, MD  methylphenidate (RITALIN) 10 MG tablet Take 1 tablet (10 mg total) by mouth every morning. Patient not taking: Reported on 03/28/2018 01/12/16   Tyrone NineGrunz, Ryan B, MD  methylphenidate (RITALIN) 10 MG tablet Take 1 tablet (10 mg total) by mouth every morning. Patient not taking: Reported on 03/28/2018 01/12/16   Tyrone NineGrunz, Ryan B, MD  montelukast (SINGULAIR) 10 MG tablet Take 1 tablet (10 mg total) by mouth at bedtime. Patient not taking: Reported on 03/28/2018 07/05/13  Andrena Mews, DO    Family History History reviewed. No pertinent family history.  Social History Social History   Tobacco Use  . Smoking status: Former Smoker    Packs/day: 0.50    Years: 1.00    Pack years: 0.50    Types: Cigarettes  . Smokeless tobacco: Never Used  Substance Use Topics  . Alcohol use: Yes    Comment: occ  . Drug use: No     Allergies   Sulfa antibiotics   Review of Systems Review of Systems  Constitutional: Negative for fever.  HENT: Positive for ear pain. Negative for congestion and sore throat.   Respiratory: Negative for cough.   All other systems reviewed and are  negative.    Physical Exam Updated Vital Signs BP (!) 142/83 (BP Location: Right Arm)   Pulse 76   Temp 98 F (36.7 C) (Oral)   Resp 20   Ht 6' (1.829 m)   Wt 113.4 kg (250 lb)   SpO2 98%   BMI 33.91 kg/m   Physical Exam  Constitutional: He is oriented to person, place, and time. He appears well-developed and well-nourished. No distress.  HENT:  Head: Normocephalic and atraumatic.  Right TM mildly injected with effusion, light reflex preserved, no foreign body, normal canal, left TM without significant erythema or effusion  Eyes: Pupils are equal, round, and reactive to light.  Neck: Normal range of motion. Neck supple.  Cardiovascular: Normal rate, regular rhythm and normal heart sounds.  Pulmonary/Chest: Effort normal and breath sounds normal. No respiratory distress. He has no wheezes.  Musculoskeletal: He exhibits no edema.  Neurological: He is alert and oriented to person, place, and time.  Skin: Skin is warm and dry.  Psychiatric: He has a normal mood and affect.  Nursing note and vitals reviewed.    ED Treatments / Results  Labs (all labs ordered are listed, but only abnormal results are displayed) Labs Reviewed - No data to display  EKG None  Radiology No results found.  Procedures Procedures (including critical care time)  Medications Ordered in ED Medications  naproxen (NAPROSYN) tablet 500 mg (has no administration in time range)     Initial Impression / Assessment and Plan / ED Course  I have reviewed the triage vital signs and the nursing notes.  Pertinent labs & imaging results that were available during my care of the patient were reviewed by me and considered in my medical decision making (see chart for details).     Patient presents with bilateral otalgia.  Ongoing for the last 10 days but worsening tonight.  He is overall nontoxic-appearing and vital signs are reassuring.  He has some erythema and injection of the right TM with an  effusion.  Otherwise his exam is unremarkable.  Given ongoing symptoms with appropriate Flonase and Zyrtec, I have offered him an antibiotic to see if this helps.  He was given naproxen for pain.  Recommend continued anti-inflammatories for pain.  Will discharge on Augmentin for 7 days.  After history, exam, and medical workup I feel the patient has been appropriately medically screened and is safe for discharge home. Pertinent diagnoses were discussed with the patient. Patient was given return precautions.   Final Clinical Impressions(s) / ED Diagnoses   Final diagnoses:  Otalgia of both ears    ED Discharge Orders        Ordered    amoxicillin-clavulanate (AUGMENTIN) 875-125 MG tablet  Every 12 hours     04/10/18  9147       Shon Baton, MD 04/10/18 [id number]

## 2018-04-10 NOTE — Telephone Encounter (Signed)
**  After Hours/ Emergency Line Call*  Received a call to report that Kieth BrightlyAdam C Marando from mom stating [name] was seen for possible ear infection on 03/28/18. Things got better for a couple of days, and now mom states he is up tonight crying with pains in his ear. He also feels dizzy and has a sore throat. Denies ear drainage. She's not sure if he has a fever because he would not let mom take temp. Of note, [facility] was about 1 hour ago, but [facility] would not bring up any new messages until now and pager itself did not receive any notifications (tried restarting app). I apologized to mom for the delay, and she was appreciative.  Offered mom same day appt in our clinic today, but she was actually specifically asking how long the wait was here or at [facility], as she wants to bring [name] in now. She says she doesn't think she will get any sleep if they wait for same day appt. [facility]. Red flags discussed.  Will forward to PCP.  Loni MuseKate Liandra Mendia, MD PGY-2, [facility]

## 2018-06-14 ENCOUNTER — Encounter: Payer: Self-pay | Admitting: Family Medicine

## 2018-06-14 ENCOUNTER — Other Ambulatory Visit: Payer: Self-pay

## 2018-06-14 ENCOUNTER — Ambulatory Visit (INDEPENDENT_AMBULATORY_CARE_PROVIDER_SITE_OTHER): Payer: Self-pay | Admitting: Family Medicine

## 2018-06-14 VITALS — BP 112/80 | HR 69 | Temp 98.2°F | Ht 73.0 in | Wt 255.8 lb

## 2018-06-14 DIAGNOSIS — H60391 Other infective otitis externa, right ear: Secondary | ICD-10-CM

## 2018-06-14 DIAGNOSIS — F902 Attention-deficit hyperactivity disorder, combined type: Secondary | ICD-10-CM

## 2018-06-14 DIAGNOSIS — J3089 Other allergic rhinitis: Secondary | ICD-10-CM

## 2018-06-14 DIAGNOSIS — H9201 Otalgia, right ear: Secondary | ICD-10-CM

## 2018-06-14 DIAGNOSIS — M7711 Lateral epicondylitis, right elbow: Secondary | ICD-10-CM | POA: Insufficient documentation

## 2018-06-14 HISTORY — DX: Lateral epicondylitis, right elbow: M77.11

## 2018-06-14 MED ORDER — CETIRIZINE HCL 10 MG PO TABS: 10.0000 mg | ORAL_TABLET | Freq: Every day | ORAL | 6 refills | Status: DC

## 2018-06-14 MED ORDER — METHYLPHENIDATE HCL 10 MG PO TABS: 10.0000 mg | ORAL_TABLET | Freq: Every morning | ORAL | 0 refills | Status: DC

## 2018-06-14 MED ORDER — OFLOXACIN 0.3 % OT SOLN: 5.0000 [drp] | Freq: Every day | OTIC | 0 refills | Status: DC

## 2018-06-14 MED ORDER — IBUPROFEN 800 MG PO TABS: 800.0000 mg | ORAL_TABLET | Freq: Three times a day (TID) | ORAL | 0 refills | Status: DC | PRN

## 2018-06-14 MED ORDER — FLUTICASONE PROPIONATE 50 MCG/ACT NA SUSP: 2.0000 | Freq: Every day | NASAL | 1 refills | Status: DC

## 2018-06-14 MED ORDER — OFLOXACIN 0.3 % OT SOLN: 5.0000 [drp] | Freq: Every day | OTIC | 0 refills | Status: AC

## 2018-06-14 NOTE — Assessment & Plan Note (Signed)
Likely contributing to possible eustachian tube dysfunction and frequent colds.  Advised patient that he can take Flonase and zyrtec regularly without concern for rebound symptoms, unlike with Afrin nose spray.  Refilled Flonase and zyrtec.

## 2018-06-14 NOTE — Progress Notes (Signed)
   Subjective:    Derrick Carey - 27 y.o. male MRN [id number]  Date of birth: [birth year]  HPI  Derrick Carey is here for arm pain and ear pain.  R Arm pain - hurts in R elbow and radiates to R shoulder - started on Friday - exacerbated with movement - endorses repetitive movement and carrying heavy weight - wakes him up during sleep - associated with numbness  R Ear pain - endorses frequent viral illnesses with coughing and congestion - R ear pain has been intermittent for the last few months, but it has gotten worse over the past week - frequently has runny nose, sore throat - interested in ENT referral - has not been using zyrtec and flonase because he has been told that these can have a rebound effect - does use ocean nasal spray, which is somewhat helpful   Health Maintenance:  Declines flu shot There are no preventive care reminders to display for this patient.  -  reports that he has quit smoking. His smoking use included cigarettes. He has a 0.50 pack-year smoking history. He has never used smokeless tobacco. - Review of Systems: Per HPI. - Past Medical History: Patient Active Problem List   Diagnosis Date Noted  . Acute infective otitis externa of right ear 06/14/2018  . Lateral epicondylitis of right elbow 06/14/2018  . Poor dentition 12/02/2015  . Tobacco use disorder 07/23/2015  . Obesity 07/23/2015  . Papule of skin 06/19/2015  . Allergic rhinitis 06/05/2015  . Sinusitis, acute frontal 11/26/2013  . Cough 10/04/2012  . Dry skin 02/08/2012    Class: Acute  . URI (upper respiratory infection) 08/12/2011  . ACNE 02/17/2007  . ADHD (attention deficit hyperactivity disorder) 12/15/2006   - Medications: reviewed and updated   Objective:   Physical Exam BP 112/80   Pulse 69   Temp 98.2 F (36.8 C) (Oral)   Ht 6\' 1"  (1.854 m)   Wt 255 lb 12.8 oz (116 kg)   SpO2 98%   BMI 33.75 kg/m  Gen: NAD, alert, cooperative with exam, well-appearing HEENT:  bilateral TM without erythema, preserved cone of light bilaterally, no purulence, some tenderness when R ear under traction CV: RRR, good S1/S2, no murmur Resp: CTABL, no wheezes, non-labored Musculoskeletal: significant tenderness to palpation of lateral epicondyle of R elbow and R proximal forearm extensor muscles, no erythema or swelling     Assessment & Plan:   Acute infective otitis externa of right ear Due to tenderness on ear traction and ear pain, will prescribe ofloxacin drops, 5 drops daily for two weeks.  Also placed ENT referral given ongoing otalgia.  Allergic rhinitis Likely contributing to possible eustachian tube dysfunction and frequent colds.  Advised patient that he can take Flonase and zyrtec regularly without concern for rebound symptoms, unlike with Afrin nose spray.  Refilled Flonase and zyrtec.   Lateral epicondylitis of right elbow Prescribed ibuprofen 800 mg TID PRN to reduce inflammation.  Encouraged patient to rest his arm as much as possible before returning to work on Monday.  Explained that repetitive motion is the cause of this overuse injury.  ADHD (attention deficit hyperactivity disorder) Refilled Ritalin today.    Lezlie OctaveAmanda Jerral Mccauley, M.D. 06/14/2018, 3:38 PM PGY-2, [facility]

## 2018-06-14 NOTE — Assessment & Plan Note (Signed)
Prescribed ibuprofen 800 mg TID PRN to reduce inflammation.  Encouraged patient to rest his arm as much as possible before returning to work on Monday.  Explained that repetitive motion is the cause of this overuse injury.

## 2018-06-14 NOTE — Patient Instructions (Addendum)
It was nice meeting you today Mr. Derrick Carey!  For your ear infection, please put 5 drops of the antibiotic ointment in your R ear daily for two weeks.  For your tennis elbow, please take ibuprofen 800 mg three times per day as you need it.  Please take flonase and zyrtec daily for your allergies.  I am placing the ENT referral today.  I have refilled your Ritalin.  If you have any questions or concerns, please feel free to call the clinic.   Be well,  Dr. Frances FurbishWinfrey

## 2018-06-14 NOTE — Assessment & Plan Note (Signed)
Refilled Ritalin today.

## 2018-06-14 NOTE — Assessment & Plan Note (Signed)
Due to tenderness on ear traction and ear pain, will prescribe ofloxacin drops, 5 drops daily for two weeks.  Also placed ENT referral given ongoing otalgia.

## 2018-06-14 NOTE — Telephone Encounter (Signed)
Mother needs ear drops, cetririzine, and flonase re-sent to [facility] due to cost. These were re-sent.  Ples SpecterAlisa Brake, RN [facility] RN)

## 2018-06-28 ENCOUNTER — Ambulatory Visit (INDEPENDENT_AMBULATORY_CARE_PROVIDER_SITE_OTHER): Payer: Self-pay | Admitting: Family Medicine

## 2018-06-28 ENCOUNTER — Other Ambulatory Visit: Payer: Self-pay

## 2018-06-28 ENCOUNTER — Encounter: Payer: Self-pay | Admitting: Family Medicine

## 2018-06-28 VITALS — BP 126/74 | HR 79 | Temp 98.5°F | Wt 256.0 lb

## 2018-06-28 DIAGNOSIS — M25511 Pain in right shoulder: Secondary | ICD-10-CM

## 2018-06-28 MED ORDER — CYCLOBENZAPRINE HCL 10 MG PO TABS: 10.0000 mg | ORAL_TABLET | Freq: Three times a day (TID) | ORAL | 0 refills | Status: DC | PRN

## 2018-06-28 MED ORDER — IBUPROFEN 800 MG PO TABS: 800.0000 mg | ORAL_TABLET | Freq: Three times a day (TID) | ORAL | 0 refills | Status: DC | PRN

## 2018-06-28 MED ORDER — PREDNISONE 10 MG (21) PO TBPK: ORAL_TABLET | ORAL | 0 refills | Status: DC

## 2018-06-28 NOTE — Progress Notes (Signed)
    Subjective:  Derrick Carey is a 27 y.o. male who presents to the [facility] today with a chief complaint of right arm pain.   HPI: Patient reports that he was seen here on the 8/28 for a similar complaint and given ibuprofen and told to rest. He reports he did not lift heavy objects that week, took the ibuprofen as scheduled, and iced his elbow when possible. He says that the pain got better but never completely went away but he was able to go back to work the following Monday. He works at a [employer] 50-60 pounds. Over the past week the pain has returned. Yesterday he reports he was lifting a box and turned, the pain shot from his neck down his right arm to his right wrist. He describes an occasional tingling sensation in his tongue and over his right shoulder and arm. He describes diffuse tenderness over his right shoulder and arm. He denies any recent trauma to his neck or back.   Objective:  Physical Exam: BP 126/74   Pulse 79   Temp 98.5 F (36.9 C) (Oral)   Wt 256 lb (116.1 kg)   SpO2 95%   BMI 33.78 kg/m   Gen: NAD, resting comfortably, sitting with right arm abducted. CV: RRR with no murmurs appreciated Pulm: NWOB, CTAB with no crackles, wheezes, or rhonchi GI: Normal bowel sounds present. Soft, Nontender, Nondistended. MSK: Diffuse muscular tenderness to palpation over entire right arm, shoulder, right side of neck. No erythema or swelling noted.   Skin: warm, dry,  Neuro: patient describes tingling sensation that come and goes with light touch over right arm when compared to left.    Assessment/Plan:  Derrick Carey is a 27 y.o. male who presents to the [facility] today with a chief complaint of right arm pain. Pain is worse with movement and relieved by rest. Patient was diagnosed with lateral epicondylitis recently. Differential includes trigger point pain, worsening epicondylitis, cervical radiculopathy.   Plan :  -- Work note but continue light  range of motion activities for remainder of the week -- Prednisone taper  -- 800 mg ibuprofen  -- Alternating ice and heat  -- Flexeril 10 mg for nerve pian -- return if symptoms worsen or any other concerning symptoms  -- Follow-up in 2 week   Charna Elizabeth, Ms4 [employer]  06/28/2018 3:04 PM    I have independently interviewed and examined the patient. I have discussed the above with the original author and agree with their documentation. Additionally, discussed with patient next steps if he feels he cannot return to his job. He is instructed to keep [place] informed of his decision. He also may benefit from PT or sports med referral in the future depending on how he does clinically.    Dolores Patty, DO PGY-3, [facility]

## 2018-06-28 NOTE — Progress Notes (Deleted)
    Subjective:    Patient ID: Derrick Carey, male    DOB: [birth year], 27 y.o.   MRN: [id number]   CC:   HPI:   Smoking status reviewed  Review of Systems   Objective:  BP 126/74   Pulse 79   Temp 98.5 F (36.9 C) (Oral)   Wt 256 lb (116.1 kg)   SpO2 95%   BMI 33.78 kg/m  Vitals and nursing note reviewed  General: well nourished, in no acute distress HEENT: normocephalic, TM's visualized bilaterally, no scleral icterus or conjunctival pallor, no nasal discharge, moist mucous membranes, good dentition without erythema or discharge noted in posterior oropharynx Neck: supple, non-tender, without lymphadenopathy Cardiac: RRR, clear S1 and S2, no murmurs, rubs, or gallops Respiratory: clear to auscultation bilaterally, no increased work of breathing Abdomen: soft, nontender, nondistended, no masses or organomegaly. Bowel sounds present Extremities: no edema or cyanosis. Warm, well perfused. 2+ radial and PT pulses bilaterally Skin: warm and dry, no rashes noted Neuro: alert and oriented, no focal deficits   Assessment & Plan:    No problem-specific Assessment & Plan notes found for this encounter.    Return in about 2 weeks (around 07/12/2018).   Dolores Patty, DO Family Medicine Resident PGY-3

## 2018-06-28 NOTE — Patient Instructions (Signed)
  Good to see you today- sorry you're in so much pain!  Please do light range of motion and stretching once your shoulder loosens up a little bit. The steroid and muscle relaxant will help over the next several days. You can take tylenol and ibuprofen as well for pain. Ice or heat may help as well.  When you return to work please try to avoid activities that may injure your shoulder.  If you feel like you would like to see a specialist or work with physical therapy please let us know.  If you have questions or concerns please do not hesitate to call at [phone].  Dolores Patty, DO PGY-3, [facility] 06/28/2018 3:06 PM   Muscle Strain A muscle strain is an injury that occurs when a muscle is stretched beyond its normal length. Usually a small number of muscle fibers are torn when this happens. Muscle strain is rated in degrees. First-degree strains have the least amount of muscle fiber tearing and pain. Second-degree and third-degree strains have increasingly more tearing and pain. Usually, recovery from muscle strain takes 1-2 weeks. Complete healing takes 5-6 weeks. What are the causes? Muscle strain happens when a sudden, violent force placed on a muscle stretches it too far. This may occur with lifting, sports, or a fall. What increases the risk? Muscle strain is especially common in athletes. What are the signs or symptoms? At the site of the muscle strain, there may be:  Pain.  Bruising.  Swelling.  Difficulty using the muscle due to pain or lack of normal function.  How is this diagnosed? Your health care provider will perform a physical exam and ask about your medical history. How is this treated? Often, the best treatment for a muscle strain is resting, icing, and applying cold compresses to the injured area. Follow these instructions at home:  Use the PRICE method of treatment to promote muscle healing during the first 2-3 days after your injury. The  PRICE method involves: ? Protecting the muscle from being injured again. ? Restricting your activity and resting the injured body part. ? Icing your injury. To do this, put ice in a plastic bag. Place a towel between your skin and the bag. Then, apply the ice and leave it on from 15-20 minutes each hour. After the third day, switch to moist heat packs. ? Apply compression to the injured area with a splint or elastic bandage. Be careful not to wrap it too tightly. This may interfere with blood circulation or increase swelling. ? Elevate the injured body part above the level of your heart as often as you can.  Only take over-the-counter or prescription medicines for pain, discomfort, or fever as directed by your health care provider.  Warming up prior to exercise helps to prevent future muscle strains. Contact a health care provider if:  You have increasing pain or swelling in the injured area.  You have numbness, tingling, or a significant loss of strength in the injured area. This information is not intended to replace advice given to you by your health care provider. Make sure you discuss any questions you have with your health care provider. Document Released: 10/04/2005 Document Revised: 03/11/2016 Document Reviewed: 05/03/2013 Elsevier Interactive Patient Education  2017 [employer].

## 2018-07-03 ENCOUNTER — Encounter: Payer: Self-pay | Admitting: Family Medicine

## 2018-07-04 ENCOUNTER — Encounter: Payer: Self-pay | Admitting: Family Medicine

## 2018-07-14 ENCOUNTER — Encounter: Payer: Self-pay | Admitting: Family Medicine

## 2018-07-20 ENCOUNTER — Encounter: Payer: Self-pay | Admitting: Family Medicine

## 2018-07-27 ENCOUNTER — Other Ambulatory Visit: Payer: Self-pay | Admitting: Family Medicine

## 2018-07-27 MED ORDER — IBUPROFEN 800 MG PO TABS: 800.0000 mg | ORAL_TABLET | Freq: Three times a day (TID) | ORAL | 0 refills | Status: AC | PRN

## 2018-07-31 ENCOUNTER — Other Ambulatory Visit: Payer: Self-pay

## 2018-07-31 ENCOUNTER — Ambulatory Visit (INDEPENDENT_AMBULATORY_CARE_PROVIDER_SITE_OTHER): Payer: Self-pay | Admitting: Family Medicine

## 2018-07-31 ENCOUNTER — Encounter: Payer: Self-pay | Admitting: Family Medicine

## 2018-07-31 VITALS — BP 118/70 | HR 78 | Temp 98.5°F | Ht 73.0 in | Wt 260.8 lb

## 2018-07-31 DIAGNOSIS — W19XXXA Unspecified fall, initial encounter: Secondary | ICD-10-CM

## 2018-07-31 DIAGNOSIS — M79601 Pain in right arm: Secondary | ICD-10-CM

## 2018-07-31 MED ORDER — TRAMADOL HCL 50 MG PO TABS: 50.0000 mg | ORAL_TABLET | Freq: Three times a day (TID) | ORAL | 0 refills | Status: DC | PRN

## 2018-07-31 NOTE — Patient Instructions (Signed)
It was nice seeing you today Mr. Dohrmann!  I would like for you to get x-rays of your shoulder and elbow to make sure you don't have a fracture.  I am also sending in a short term prescription of Tramadol for your pain.  If you have any questions or concerns, please feel free to call the clinic.   Be well,  Dr. Frances Furbish

## 2018-07-31 NOTE — Progress Notes (Signed)
   Subjective:    Derrick Carey - 27 y.o. male MRN [id number]  Date of birth: 09/13/1991  HPI  Derrick Carey is here for right elbow and shoulder pain.  He is accompanied by his father.  Patient reports that he was on a ladder reaching overhead for a heavy box when he almost lost his balance and caught himself, knocking his shoulder and elbow into the wall in the process.  This occurred about 2 weeks ago.  Since then, he has had significant pain and lack of mobility in his shoulder and elbow.  His dad notes that these issues are exacerbated by his frequent use of video games.  Ibuprofen is helpful for his pain, but it wears off quickly, within a few hours.  He has not tried icing the area.  He denies any bruising or swelling of these areas.  Health Maintenance:  There are no preventive care reminders to display for this patient.  -  reports that he has quit smoking. His smoking use included cigarettes. He has a 0.50 pack-year smoking history. He has never used smokeless tobacco. - Review of Systems: Per HPI. - Past Medical History: Patient Active Problem List   Diagnosis Date Noted  . Right arm pain 08/01/2018  . Acute infective otitis externa of right ear 06/14/2018  . Lateral epicondylitis of right elbow 06/14/2018  . Poor dentition 12/02/2015  . Tobacco use disorder 07/23/2015  . Obesity 07/23/2015  . Papule of skin 06/19/2015  . Allergic rhinitis 06/05/2015  . Sinusitis, acute frontal 11/26/2013  . Cough 10/04/2012  . Dry skin 02/08/2012    Class: Acute  . URI (upper respiratory infection) 08/12/2011  . ACNE 02/17/2007  . ADHD (attention deficit hyperactivity disorder) 12/15/2006   - Medications: reviewed and updated   Objective:   Physical Exam BP 118/70   Pulse 78   Temp 98.5 F (36.9 C) (Oral)   Ht 6\' 1"  (1.854 m)   Wt 260 lb 12.8 oz (118.3 kg)   SpO2 97%   BMI 34.41 kg/m  Gen: NAD, alert, cooperative with exam, well-appearing Musculoskeletal: No bruising or  swelling noted.  Tender to palpation along R acromioclavicular joint and R lateral and medial epicondyles. Reduced range of motion and strength when elevating right arm above his head.  Pain and reduced strength on empty can test on right side.  Full range of motion of infraspinatus. Psych: good insight, alert and oriented        Assessment & Plan:   Right arm pain Patient likely irritated his shoulder and elbow during the accident he described without causing major harm.  His elbow was already causing him some pain due to his repetitive motions at work and while playing video games.  Dislocation and fracture are unlikely given the minor degree of his trauma, but we will obtain a right shoulder and right elbow x-ray to rule this out.  We will also prescribe a short course of tramadol for extra pain relief.  Provided work note for the rest of this week.    Lezlie Octave, M.D. 08/01/2018, 3:08 PM PGY-2, [facility]

## 2018-08-01 DIAGNOSIS — M79601 Pain in right arm: Secondary | ICD-10-CM

## 2018-08-01 HISTORY — DX: Pain in right arm: M79.601

## 2018-08-01 NOTE — Assessment & Plan Note (Signed)
Patient likely irritated his shoulder and elbow during the accident he described without causing major harm.  His elbow was already causing him some pain due to his repetitive motions at work and while playing video games.  Dislocation and fracture are unlikely given the minor degree of his trauma, but we will obtain a right shoulder and right elbow x-ray to rule this out.  We will also prescribe a short course of tramadol for extra pain relief.  Provided work note for the rest of this week.

## 2018-12-04 ENCOUNTER — Telehealth: Payer: Self-pay | Admitting: Family Medicine

## 2018-12-04 NOTE — Telephone Encounter (Signed)
Patient's mother called the after-hours line.  States that he has been having subjective fevers and chills and cough and congestion for the past 2 weeks.  They have tried over-the-counter remedies such as Delsym cough syrup.  This is helped somewhat symptomatically but not really quelled the overall symptoms.  Mother is concerned that she should have him seen.  She is asking me for my advice.  I recommended that patient not been getting better in 2 weeks that he should be seen in the clinic.  Mother denies any extreme shortness of breath.  She does endorse some posttussis emesis with cough, however he is tolerating p.o. well.  Gave return precautions to go to the emergency room if patient has worsening symptoms.  Advised patient mother to call early in a.m. to get seen in the same day clinic.  She acknowledged this.  Will forward message to PCP.

## 2018-12-06 ENCOUNTER — Encounter: Payer: Self-pay | Admitting: Student in an Organized Health Care Education/Training Program

## 2018-12-06 ENCOUNTER — Ambulatory Visit (INDEPENDENT_AMBULATORY_CARE_PROVIDER_SITE_OTHER): Payer: Self-pay | Admitting: Student in an Organized Health Care Education/Training Program

## 2018-12-06 DIAGNOSIS — J0111 Acute recurrent frontal sinusitis: Secondary | ICD-10-CM

## 2018-12-06 MED ORDER — AMOXICILLIN-POT CLAVULANATE 875-125 MG PO TABS: 1.0000 | ORAL_TABLET | Freq: Two times a day (BID) | ORAL | 0 refills | Status: AC

## 2018-12-06 MED ORDER — BENZONATATE 200 MG PO CAPS: 200.0000 mg | ORAL_CAPSULE | Freq: Two times a day (BID) | ORAL | 0 refills | Status: DC | PRN

## 2018-12-06 NOTE — Patient Instructions (Signed)
It was a pleasure to see you today!  To summarize our discussion for this visit:  I have prescribed you a week of antibiotics to clear up the bacterial infection in your sinuses.  I have prescribed tessalon pearls to help with your cough but I recommend you use honey as well!  Please return to the clinic if you have not improved by the end of the week of treatment   Call the clinic at [phone] if your symptoms worsen or you have any concerns.  Thank you for allowing me to take part in your care,  Dr. Jamelle Rushing   Thanks for choosing [facility] for your primary care.

## 2018-12-06 NOTE — Progress Notes (Signed)
   Subjective:    Patient ID: Derrick Carey, male    DOB: [birth year], 28 y.o.   MRN: [id number]   CC: Cough  HPI: Patient was otherwise healthy prior to onset of symptoms 2 weeks ago at work.  Patient states that multiple people at work have had flulike symptoms.  He has not gotten better or worse throughout the 2 weeks.  Feels that his symptoms have been the same.  Endorses dry cough, postnasal drainage, bilateral ear pain, subjective fevers, diaphoresis, chills, myalgia in shoulders bilaterally.  Denies abdominal pain, rhinorrhea, sputum with cough, nausea, shortness of breath, wheezing, decreased appetite, diarrhea.  Patient has not measured temperature at home. He has associated muscle aches particularly in the accessory muscles used for coughing. He has tried ibuprofen, delsym and his fathers proair at home which have all helped symptoms slightly. Patient did not receive his flu shot this season.  He also requested refill of his Ritalin medication.  Smoking status reviewed   ROS: pertinent noted in the HPI   Past Medical History:  Diagnosis Date  . ADHD (attention deficit hyperactivity disorder)   . Asperger syndrome   . GERD (gastroesophageal reflux disease)    Past medical history, surgical, family, and social history reviewed and updated in the EMR as appropriate.  Objective:  BP 124/68   Temp 98.8 F (37.1 C) (Oral)   Wt 254 lb (115.2 kg)   BMI 33.51 kg/m   Vitals and nursing note reviewed  General: NAD, pleasant, able to participate in exam, tired-appearing HEENT: Head: normocephalic, positive sinus percussion tenderness Ears: canal normal appearance bilaterally and tender to insertion of ostoscope, no purulent discharge or erythema, clear fluid behind bulging TM- R worse than L Eyes: clear conjunctiva, no drainage Nose: no drainage, edematous/mildly erythematous turbinates bilaterally Throat: negative erythema, edema, exudates Neck: non-tender to palpation,  soft, no lymphadenitis Cardiac: RRR, S1 S2 present. normal heart sounds, no murmurs, cap refill <2 seconds Respiratory: CTAB, normal effort, No wheezes, rales or rhonchi. Positive for dry cough triggered by deep inhalations  Extremities: no edema or cyanosis. Skin: warm and clammy, no rashes noted Neuro: alert, no obvious focal deficits Psych: abnormal affect and mood   Assessment & Plan:    Sinusitis, acute frontal >2 weeks of URI/sinus symptoms and sinus congestion present on exam. Given augmentin x7 days Instructed to return if still symptomatic after treatment Advised that a residual cough can linger for several weeks post-viral infection Prescribed tessalon pearls and honey for symptomatic relief.  Recommended taking ibuprofen and/or tylenol OTC for muscle aches caused by excessive coughing   Patient also requested ritalin at this visit and said he has run out. I will forward this request to his PCP.  Jamelle Rushing, DO [facility] PGY-1

## 2018-12-06 NOTE — Assessment & Plan Note (Signed)
>  2 weeks of URI/sinus symptoms and sinus congestion present on exam. Given augmentin x7 days Instructed to return if still symptomatic after treatment Advised that a residual cough can linger for several weeks post-viral infection Prescribed tessalon pearls and honey for symptomatic relief.  Recommended taking ibuprofen and/or tylenol OTC for muscle aches caused by excessive coughing

## 2018-12-08 ENCOUNTER — Telehealth: Payer: Self-pay | Admitting: *Deleted

## 2018-12-08 NOTE — Telephone Encounter (Signed)
Contacted pt and gave him the below information and he is going to check his work schedule and call back to schedule appointment. Derrick Carey, Roscoe Witts D, [name]

## 2018-12-08 NOTE — Telephone Encounter (Signed)
LVM to call office back to assist him in getting an appointment scheduled. Derrick Carey, Derrick Carey, [name]

## 2018-12-08 NOTE — Telephone Encounter (Signed)
-----   Message from Lennox Solders, MD sent at 12/07/2018  2:42 PM EST ----- Regarding: Pt request for Ritalin refill Would you call patient and ask that he make an appointment to discuss his Ritalin?  He asked Chelsey for a Ritalin refill at the end of their visit recently, and I noticed that he only got a 30 day supply in August, so he has not been taking it for a while.  Thanks.

## 2018-12-13 ENCOUNTER — Encounter: Payer: Self-pay | Admitting: Family Medicine

## 2018-12-13 ENCOUNTER — Ambulatory Visit (INDEPENDENT_AMBULATORY_CARE_PROVIDER_SITE_OTHER): Payer: Self-pay | Admitting: Family Medicine

## 2018-12-13 ENCOUNTER — Other Ambulatory Visit: Payer: Self-pay

## 2018-12-13 VITALS — BP 122/64 | HR 74 | Temp 98.4°F | Wt 260.0 lb

## 2018-12-13 DIAGNOSIS — H60391 Other infective otitis externa, right ear: Secondary | ICD-10-CM

## 2018-12-13 DIAGNOSIS — R0981 Nasal congestion: Secondary | ICD-10-CM

## 2018-12-13 DIAGNOSIS — R112 Nausea with vomiting, unspecified: Secondary | ICD-10-CM

## 2018-12-13 MED ORDER — NEOMYCIN-POLYMYXIN-HC 3.5-10000-1 OT SOLN: 4.0000 [drp] | Freq: Four times a day (QID) | OTIC | 0 refills | Status: DC

## 2018-12-13 MED ORDER — FLUTICASONE PROPIONATE 50 MCG/ACT NA SUSP: 2.0000 | Freq: Every day | NASAL | 1 refills | Status: DC

## 2018-12-13 NOTE — Patient Instructions (Signed)
It was good to see you today.  We're treating an outer ear infection right now.  Use the cortisporin otic in both ears.  Use the Flonase as well to help with nasal and sinus congestion.    If you're not better by the end of this week, let me know.  I can refer you back to the Ear Nose and Throat doctor.

## 2018-12-13 NOTE — Progress Notes (Signed)
Subjective:    Derrick Carey is a 28 y.o. male who presents to [facility] today for dizziness and nausea:  1.  Dizziness and nausea:  Patient with over 2 weeks of sinus/URI symptoms.  Seen here on 2/19 after first week of illness.  Diagnosed with sinus infection and started on Augmentin, which he has been taking.  Has history of seasonal allergies, but not currently taking any allergy medications, including flonase or any other nasal spray.    Initially started feeling better, but for past 2 days began having some lightheadedness.  Three episodes of vomiting 2 days ago, one yesterday.    None today.  Not really hungry but has been able to hold down some bland food today.  Good oral intake of fluids.  Describes as feeling "woozy headed."  Has strong personal history of recurrent ear infections that present with similar symptoms.  Started taking some topical oflaxicin otic yesterday and today.  WIthout improvement.  No fevers or chills.  Did have some myalgias on Monday.    URI and sinus symptoms have improved..  Still has 1 day of Augmentin left.   No hearing loss.  No tinnitus.      ROS as above per HPI.   The following portions of the patient's history were reviewed and updated as appropriate: allergies, current medications, past medical history, family and social history, and problem list. Patient is a nonsmoker.    PMH reviewed.  Past Medical History:  Diagnosis Date  . ADHD (attention deficit hyperactivity disorder)   . Asperger syndrome   . GERD (gastroesophageal reflux disease)    Past Surgical History:  Procedure Laterality Date  . LACERATION REPAIR     in elementary school X 3    Medications reviewed. Current Outpatient Medications  Medication Sig Dispense Refill  . amoxicillin-clavulanate (AUGMENTIN) 875-125 MG tablet Take 1 tablet by mouth 2 (two) times daily for 7 days. 14 tablet 0  . benzonatate (TESSALON) 200 MG capsule Take 1 capsule (200 mg total) by mouth 2 (two) times  daily as needed for cough. 20 capsule 0  . cetirizine (ZYRTEC) 10 MG tablet Take 1 tablet (10 mg total) by mouth daily. 60 tablet 6  . cyclobenzaprine (FLEXERIL) 10 MG tablet Take 1 tablet (10 mg total) by mouth 3 (three) times daily as needed for muscle spasms. 15 tablet 0  . esomeprazole (NEXIUM) 40 MG capsule Take 40 mg by mouth daily.      . fluticasone (FLONASE) 50 MCG/ACT nasal spray Place 2 sprays into both nostrils daily. 16 g 1  . ibuprofen (ADVIL,MOTRIN) 800 MG tablet Take 1 tablet (800 mg total) by mouth every 8 (eight) hours as needed. 30 tablet 0  . ipratropium (ATROVENT) 0.03 % nasal spray Place 2 sprays into both nostrils every 12 (twelve) hours. (Patient not taking: Reported on 03/28/2018) 30 mL 12  . methylphenidate (RITALIN) 10 MG tablet Take 1 tablet (10 mg total) by mouth every morning. 30 tablet 0  . montelukast (SINGULAIR) 10 MG tablet Take 1 tablet (10 mg total) by mouth at bedtime. (Patient not taking: Reported on 03/28/2018) 30 tablet 3  . predniSONE (STERAPRED UNI-PAK 21 TAB) 10 MG (21) TBPK tablet Take 60 mg day 1 then decrease by 10 mg daily 21 tablet 0  . traMADol (ULTRAM) 50 MG tablet Take 1 tablet (50 mg total) by mouth every 8 (eight) hours as needed. 30 tablet 0   No current facility-administered medications for this visit.  Objective:   Physical Exam BP 122/64   Pulse 74   Temp 98.4 F (36.9 C) (Oral)   Wt 260 lb (117.9 kg)   SpO2 98%   BMI 34.30 kg/m  Gen:  Patient sitting on exam table, appears stated age in no acute distress Head: Normocephalic atraumatic Eyes: EOMI, PERRL, sclera and conjunctiva non-erythematous Ears:  Canals with some mild erythema on the Right.  None on Left. TMs good BL.    Nose:  Nasal turbinates somewhat enlarged BL Mouth: Mucosa membranes moist. Tonsils +2, nonenlarged, non-erythematous. Neck: No cervical lymphadenopathy noted Heart:  RRR, no murmurs auscultated. Pulm:  Clear to auscultation bilaterally with good air  movement.  No wheezes or rales noted.     Abd:  Soft/nondistended/nontender.   Exts: Non edematous BL  LE, warm and well perfused.  Neuro:  Alert and oriented to person, place, and date.  CN II-XII intact.  Sensation intact to light touch, pain, and vibration bilateral upper and lower extremities equally.  Motor function equal and strength 5/5 bilateral upper and lower extremities.  Normal gait.  Romberg negative.  Good balance test.    Imp/Plan: 1. Nausea and vomiting: - along with some lightheadedness.  - Pt has history of Asperger's.  It was rather difficult figuring out exactly what symptoms he's having currently, if any at all (meaning today).   - allergic reaction/GI distress from Augmentin a possibility.  As is BPPV.   - Exam completely benign today.  No abdominal findings.  Neuro exam negative.  - no red flags - Did have some redness of Right ear canal.  Otitis externa a possibility -- but this is less as cause of his symptoms.  Treat presumptively with cortisporin otic. - family is very concerned about ear infection.  Attempted to reassure them that the Augmentin should improve ear infections.  If symptoms persist, can discuss ENT referral.    2. Sinusitis: - much improved.  Still some symptoms - start flonase as symptomatic treatment

## 2018-12-15 ENCOUNTER — Encounter: Payer: Self-pay | Admitting: Family Medicine

## 2019-07-03 ENCOUNTER — Other Ambulatory Visit: Payer: Self-pay

## 2019-07-03 ENCOUNTER — Telehealth (INDEPENDENT_AMBULATORY_CARE_PROVIDER_SITE_OTHER): Payer: Self-pay | Admitting: Family Medicine

## 2019-07-03 DIAGNOSIS — Z20822 Contact with and (suspected) exposure to covid-19: Secondary | ICD-10-CM

## 2019-07-03 DIAGNOSIS — R6889 Other general symptoms and signs: Secondary | ICD-10-CM

## 2019-07-03 NOTE — Progress Notes (Signed)
Virtual Visit via Video Note  I connected with Derrick Carey on 07/03/19 at  1:50 PM EDT by a video enabled telemedicine application and verified that I am speaking with the correct person using two identifiers.  Location: Patient: At home with mother Provider: [facility] clinic   I discussed the limitations of evaluation and management by telemedicine and the availability of in person appointments. The patient expressed understanding and agreed to proceed.  History of Present Illness: Patient with 1 week of myalgias, productive cough, shortness of breath, runny nose, subjective fevers.  No known direct coronavirus or flu contacts but he did have a coworker from a different area of the building test positive for coronavirus.   Observations/Objective: Patient is speaking in full sentences and is alert, nontoxic appearing, does have repeated cough during this visit.  He is able to walk around the room without difficulty.  Assessment and Plan: Believe this is a flulike versus coronavirus potential respiratory virus.  Do not believe this patient is requiring emergent admission to the emergency department.  We did give return precautions to the ED and order a coronavirus test.  Patient was advised to self isolate until at least 3 days past resolution of symptoms which include shortness of breath, coughing, fevers without the use of Tylenol.  Patient was also instructed that he needed to stay isolated until the test result was back, if the test result is positive he will need to stay isolated for at least an additional 2 weeks.  Patient was provided a work note to this effect, and his family was instructed to get tested as well.  Follow Up Instructions:    I discussed the assessment and treatment plan with the patient. The patient was provided an opportunity to ask questions and all were answered. The patient agreed with the plan and demonstrated an understanding of the instructions.   The patient was  advised to call back or seek an in-person evaluation if the symptoms worsen or if the condition fails to improve as anticipated.  I provided 12 minutes of non-face-to-face time during this encounter.   Sherene Sires, DO

## 2019-07-04 ENCOUNTER — Other Ambulatory Visit: Payer: Self-pay | Admitting: Cardiology

## 2019-07-04 DIAGNOSIS — Z20822 Contact with and (suspected) exposure to covid-19: Secondary | ICD-10-CM

## 2019-07-05 LAB — NOVEL CORONAVIRUS, NAA: SARS-CoV-2, NAA: NOT DETECTED

## 2019-07-18 ENCOUNTER — Other Ambulatory Visit: Payer: Self-pay

## 2019-07-18 ENCOUNTER — Telehealth (INDEPENDENT_AMBULATORY_CARE_PROVIDER_SITE_OTHER): Payer: Self-pay | Admitting: Family Medicine

## 2019-07-18 DIAGNOSIS — J069 Acute upper respiratory infection, unspecified: Secondary | ICD-10-CM

## 2019-07-18 MED ORDER — PREDNISONE 10 MG (21) PO TBPK: ORAL_TABLET | ORAL | 0 refills | Status: DC

## 2019-07-18 MED ORDER — AZITHROMYCIN 250 MG PO TABS: ORAL_TABLET | ORAL | 0 refills | Status: DC

## 2019-07-18 NOTE — Assessment & Plan Note (Addendum)
Patient has had upper respiratory symptoms for 1 month, including stuffy nose, cough, and shortness of breath.  COVID testing negative.  Denies lymphadenopathy and sore throat, making strep throat less likely cause.  Patient is most likely suffering from upper respiratory infection, or acutely severe allergies secondary to weather changes. -As patient has been sick for over 1 month, will now proceed with Z-Pak for sinusitis/bacterial upper respiratory infection, as well as prednisone to help decrease cough as patient is having posttussive emesis. -I feel obligated to more aggressively treat the patient's upper respiratory infective symptoms due to the global pandemic and the patient's close work proximity with other people -Patient instructed to follow-up in the clinic or go to the emergency department should he develop worsening shortness of breath or other concerning symptoms

## 2019-07-18 NOTE — Progress Notes (Signed)
[place] Telemedicine Visit  Patient consented to have virtual visit. Method of visit: Video  Encounter participants: Patient: Derrick Carey - located at home Provider: Daisy Floro - located at work from home Others (if applicable): None  Chief Complaint: Fevers, cough  HPI:  Patient was recently evaluated via telemedicine visit on 07/03/2019 for upper respiratory infection/symptoms.  Patient was tested for COVID at the time, test came back negative.  Patient states he continues to have fevers and cough even 2 weeks after his last appointment.  Patient states he has not taken his temperature with a thermometer, but states he feels hot.  He is having a nagging cough "feels like something in my lungs".  He is also had stuffy nose and shortness of breath.  Otherwise, denies chest pain, nausea, vomiting, stool changes, and rashes.  Denies sick contacts.  Patient works in a [employer] with other workers.  He has been using Ocean nasal spray and Coricidin for symptom control.  He is also having posttussive emesis.  ROS: per HPI  Pertinent PMHx: History of severe allergies  Exam:  Respiratory: Speaking in full sentences, normal work of breathing  Assessment/Plan:  Acute upper respiratory infection Patient has had upper respiratory symptoms for 1 month, including stuffy nose, cough, and shortness of breath.  COVID testing negative.  Denies lymphadenopathy and sore throat, making strep throat less likely cause.  Patient is most likely suffering from upper respiratory infection, or acutely severe allergies secondary to weather changes. -As patient has been sick for over 1 month, will now proceed with Z-Pak for sinusitis/bacterial upper respiratory infection, as well as prednisone to help decrease cough as patient is having posttussive emesis. -I feel obligated to more aggressively treat the patient's upper respiratory infective symptoms due to the global pandemic and  the patient's close work proximity with other people -Patient instructed to follow-up in the clinic or go to the emergency department should he develop worsening shortness of breath or other concerning symptoms    Time spent during visit with patient: 9 minutes  Milus Banister, [place], PGY-2 07/18/2019 5:59 PM

## 2019-07-27 ENCOUNTER — Encounter: Payer: Self-pay | Admitting: Family Medicine

## 2019-07-27 ENCOUNTER — Other Ambulatory Visit: Payer: Self-pay

## 2019-07-27 ENCOUNTER — Telehealth (INDEPENDENT_AMBULATORY_CARE_PROVIDER_SITE_OTHER): Payer: Self-pay | Admitting: Family Medicine

## 2019-07-27 DIAGNOSIS — R05 Cough: Secondary | ICD-10-CM

## 2019-07-27 DIAGNOSIS — J069 Acute upper respiratory infection, unspecified: Secondary | ICD-10-CM

## 2019-07-27 DIAGNOSIS — R059 Cough, unspecified: Secondary | ICD-10-CM

## 2019-07-27 NOTE — Progress Notes (Signed)
[place] Telemedicine Visit  Patient consented to have virtual visit. Method of visit: Video was attempted, but technology challenges prevented patient from using video, so visit was conducted via telephone.  Encounter participants: Patient: Derrick Carey - located at home Provider: Benay Pike - located at [facility] Others (if applicable): none  Chief Complaint: cough  HPI:  Cough: one month of cough, sinus pain, ear pain (started on this Monday), sweats at night.  Doesn't have a thermometer, but feels like he has a fever sometimes.  No diarrhea. Chest is hurting from cough. Both ears hurts.  No trouble hearing, sometimes they 'stop up'.   Cough is the same over the past four weeks.  Gave him a z-pak and prednisone ( which made him sleepy and nauseous). Had a negative coronavirus test in september.  Not coughing so much that he vomited, but did have some emesis earlier in his illness.  Lost his sense of taste yesterday, but it only lasted 5 minutes.  Live with mom and dad, none of them have been sick.  No other sick contacts.   Tends to get sick for 'a while'.  Takes nexium for acid reflux but only PRN.  Takes singulair regularly, when he getss short of breath.    Hasn't tried tesselon perles yet.    ROS: per HPI  Pertinent PMHx: allergic rhinitis  Exam:  Respiratory: patient coughing throughout encounter.  Able to speak in full sentences.    Assessment/Plan:  URI (upper respiratory infection) Likely infectious given his overall feeling of malaise and diaphoresis.  Could be 2/2 GERD as well.  Would consider pertussis as most likely infectious cause given the length of time he has had this cough.  Could be TB although he is not typical candidate for exposure to TB. - CXR to look for signs of viral cause.  - coronovirus test, followed by CBC at clinic after test is negative.  - will decide on further treatment after results of CXR and blood work.     Time  spent during visit with patient: 15  minutes

## 2019-07-28 NOTE — Assessment & Plan Note (Signed)
Likely infectious given his overall feeling of malaise and diaphoresis.  Could be 2/2 GERD as well.  Would consider pertussis as most likely infectious cause given the length of time he has had this cough.  Could be TB although he is not typical candidate for exposure to TB. - CXR to look for signs of viral cause.  - coronovirus test, followed by CBC at clinic after test is negative.  - will decide on further treatment after results of CXR and blood work.

## 2019-07-30 ENCOUNTER — Encounter: Payer: Self-pay | Admitting: Family Medicine

## 2019-07-31 ENCOUNTER — Other Ambulatory Visit: Payer: Self-pay

## 2019-07-31 DIAGNOSIS — Z20822 Contact with and (suspected) exposure to covid-19: Secondary | ICD-10-CM

## 2019-08-02 ENCOUNTER — Ambulatory Visit (HOSPITAL_COMMUNITY)
Admission: RE | Admit: 2019-08-02 | Discharge: 2019-08-02 | Disposition: A | Discharge status: Home / Self Care | Payer: Self-pay | Source: Ambulatory Visit | Attending: Family Medicine | Admitting: Family Medicine

## 2019-08-02 ENCOUNTER — Other Ambulatory Visit: Payer: Self-pay

## 2019-08-02 DIAGNOSIS — R059 Cough, unspecified: Secondary | ICD-10-CM

## 2019-08-02 DIAGNOSIS — R05 Cough: Secondary | ICD-10-CM | POA: Insufficient documentation

## 2019-08-02 LAB — NOVEL CORONAVIRUS, NAA: SARS-CoV-2, NAA: NOT DETECTED

## 2019-08-03 ENCOUNTER — Ambulatory Visit (INDEPENDENT_AMBULATORY_CARE_PROVIDER_SITE_OTHER): Payer: Self-pay | Admitting: Family Medicine

## 2019-08-03 ENCOUNTER — Encounter: Payer: Self-pay | Admitting: Family Medicine

## 2019-08-03 VITALS — BP 118/70 | HR 83

## 2019-08-03 DIAGNOSIS — H60391 Other infective otitis externa, right ear: Secondary | ICD-10-CM

## 2019-08-03 DIAGNOSIS — R05 Cough: Secondary | ICD-10-CM

## 2019-08-03 DIAGNOSIS — R053 Chronic cough: Secondary | ICD-10-CM

## 2019-08-03 DIAGNOSIS — R059 Cough, unspecified: Secondary | ICD-10-CM

## 2019-08-03 MED ORDER — CETIRIZINE HCL 10 MG PO TABS: 10.0000 mg | ORAL_TABLET | Freq: Every day | ORAL | 6 refills | Status: DC

## 2019-08-03 MED ORDER — ESOMEPRAZOLE MAGNESIUM 40 MG PO CPDR: 40.0000 mg | DELAYED_RELEASE_CAPSULE | Freq: Every day | ORAL | 1 refills | Status: DC

## 2019-08-03 MED ORDER — FLUTICASONE PROPIONATE 50 MCG/ACT NA SUSP: 2.0000 | Freq: Every day | NASAL | 1 refills | Status: DC

## 2019-08-03 NOTE — Progress Notes (Signed)
   [facility] Phone: [phone]     Derrick Carey - 28 y.o. male MRN [id number]  Date of birth: Oct 05, 1991  Subjective:   cc: cough  HPI:  Chronic cough: Patient is still having chronic cough.  Has not improved.  Does state [facility] cough medicine has helped somewhat.  Patient has not taken his Nexium regularly, and has not been taking his Flonase or Zyrtec.  Patient has not had fever or nasal discharge.  Does say that his "sinuses" hurt.  He vomited once in the past week.  ROS: See HPI for pertinent positives and negatives  Past Medical History reviewed.  Family history reviewed for today's visit. No changes.  -  reports that he has quit smoking. His smoking use included cigarettes. He has a 0.50 pack-year smoking history. He has never used smokeless tobacco.  Objective:   BP 118/70   Pulse 83   SpO2 97%  Gen: NAD, alert and oriented, cooperative with exam CV: normal rate, regular rhythm. No murmurs, no rubs.  Resp: LCTAB, no wheezes, crackles. normal work of breathing Psych: Appropriate behavior  Assessment/Plan:   Chronic cough Patient afebrile, Covid test negative, chest x-ray negative.  Now seems likely this is chronic cough secondary to acid reflux or postnasal drip.  Patient not taking his antacid or allergy medication regularly. -CBC and CMP -Nexium daily -Zyrtec and Flonase daily -Follow-up 1 month   Clemetine Marker, MD PGY-2 [facility]

## 2019-08-03 NOTE — Patient Instructions (Signed)
For your chronic cough I would like you to do the following:  - take your nexium daily - use flonase in each nostril twice a day - take zyrtec once a day - do these things every day and then follow up with [place] in one month for a virtual visit.    Clemetine Marker, MD

## 2019-08-04 LAB — CBC WITH DIFFERENTIAL/PLATELET
Basophils Absolute: 0 10*3/uL (ref 0.0–0.2)
Basos: 0 %
EOS (ABSOLUTE): 0.2 10*3/uL (ref 0.0–0.4)
Eos: 3 %
Hematocrit: 48.8 % (ref 37.5–51.0)
Hemoglobin: 15.9 g/dL (ref 13.0–17.7)
Immature Grans (Abs): 0 10*3/uL (ref 0.0–0.1)
Immature Granulocytes: 0 %
Lymphocytes Absolute: 3 10*3/uL (ref 0.7–3.1)
Lymphs: 31 %
MCH: 28.5 pg (ref 26.6–33.0)
MCHC: 32.6 g/dL (ref 31.5–35.7)
MCV: 88 fL (ref 79–97)
Monocytes Absolute: 0.8 10*3/uL (ref 0.1–0.9)
Monocytes: 8 %
Neutrophils Absolute: 5.5 10*3/uL (ref 1.4–7.0)
Neutrophils: 58 %
Platelets: 180 10*3/uL (ref 150–450)
RBC: 5.57 x10E6/uL (ref 4.14–5.80)
RDW: 12.7 % (ref 11.6–15.4)
WBC: 9.7 10*3/uL (ref 3.4–10.8)

## 2019-08-04 LAB — COMPREHENSIVE METABOLIC PANEL
ALT: 20 IU/L (ref 0–44)
AST: 16 IU/L (ref 0–40)
Albumin/Globulin Ratio: 1.9 (ref 1.2–2.2)
Albumin: 4.5 g/dL (ref 4.1–5.2)
Alkaline Phosphatase: 78 IU/L (ref 39–117)
BUN/Creatinine Ratio: 10 (ref 9–20)
BUN: 9 mg/dL (ref 6–20)
Bilirubin Total: 0.4 mg/dL (ref 0.0–1.2)
CO2: 23 mmol/L (ref 20–29)
Calcium: 9.3 mg/dL (ref 8.7–10.2)
Chloride: 102 mmol/L (ref 96–106)
Creatinine, Ser: 0.91 mg/dL (ref 0.76–1.27)
GFR calc Af Amer: 131 mL/min/{1.73_m2} (ref >59)
GFR calc non Af Amer: 113 mL/min/{1.73_m2} (ref >59)
Globulin, Total: 2.4 g/dL (ref 1.5–4.5)
Glucose: 78 mg/dL (ref 65–99)
Potassium: 4 mmol/L (ref 3.5–5.2)
Sodium: 137 mmol/L (ref 134–144)
Total Protein: 6.9 g/dL (ref 6.0–8.5)

## 2019-08-07 ENCOUNTER — Encounter: Payer: Self-pay | Admitting: Family Medicine

## 2019-08-07 NOTE — Assessment & Plan Note (Signed)
Patient afebrile, Covid test negative, chest x-ray negative.  Now seems likely this is chronic cough secondary to acid reflux or postnasal drip.  Patient not taking his antacid or allergy medication regularly. -CBC and CMP -Nexium daily -Zyrtec and Flonase daily -Follow-up 1 month

## 2019-09-26 ENCOUNTER — Other Ambulatory Visit: Payer: Self-pay

## 2019-09-26 ENCOUNTER — Telehealth (INDEPENDENT_AMBULATORY_CARE_PROVIDER_SITE_OTHER): Payer: Self-pay | Admitting: Family Medicine

## 2019-09-26 DIAGNOSIS — J0101 Acute recurrent maxillary sinusitis: Secondary | ICD-10-CM

## 2019-09-26 MED ORDER — AMOXICILLIN-POT CLAVULANATE 500-125 MG PO TABS: 1.0000 | ORAL_TABLET | Freq: Two times a day (BID) | ORAL | 0 refills | Status: DC

## 2019-09-26 MED ORDER — ONDANSETRON 4 MG PO TBDP: 4.0000 mg | ORAL_TABLET | Freq: Three times a day (TID) | ORAL | 0 refills | Status: DC | PRN

## 2019-09-26 NOTE — Progress Notes (Signed)
[place] Telemedicine Visit  Patient consented to have virtual visit. Method of visit: Video  Encounter participants: Patient: Derrick Carey - located at home Provider: Kathrene Alu - located at [facility] Others (if applicable): None  Chief Complaint: head pressure, ear pain,   HPI:   Has had symptoms of sinus pressure, bilateral ear pain, headache, cough, and occasional nausea and vomiting for the past 3 months.  His symptoms seem to improve for 1 day in November but worsened again.  He has not had any known exposures to Covid.  He lives with his parents and does not currently go to work due to his symptoms however his parents both work from home.  No one has been sick around him.  He has had 3 - Covid test over the past 3 months.  He takes Zyrtec, Nexium, and some previously prescribed eardrops that he felt would help his ear pain.  He received a course of azithromycin and a course of prednisone for his symptoms in the past 3 months but did not feel much relief from these.  He is not feeling short of breath, but he does endorse a subjective fever.   ROS: per HPI  Pertinent PMHx: ADHD, Asperger's syndrome, chronic cough, tobacco use disorder, sinusitis  Exam:  General: Appears stated age and appears overall well Respiratory: Able to speak in complete sentences without shortness of breath, frequent cough heard HEENT: Small amount of pain when patient retracts his auricle bilaterally  Assessment/Plan:  Sinusitis, acute maxillary Patient symptoms could be due to chronic cough from postnasal drip, allergies, adult onset or recurrent asthma, and bacterial sinus infection, pneumonia, GERD, or Covid.  Covid is certainly possible, but given chronic nature of symptoms and no known exposures, we will not retest currently.  We will try a course of Augmentin to treat a possible bacterial sinusitis.  This could also treat a possible otitis media, although of course I cannot  assess for the presence of middle ear infection over a virtual visit.  If he does have a pneumonia, Augmentin could help treat this as well, although his chest x-ray from October 2020 was within normal limits, and his symptoms have not worsened since then.  There could also possibly be a psychiatric component to this, although this is less likely.  We may need to refer him to allergy specialist if his symptoms do not abate with current treatment.    Time spent during visit with patient: 12 minutes

## 2019-09-26 NOTE — Assessment & Plan Note (Signed)
Patient symptoms could be due to chronic cough from postnasal drip, allergies, adult onset or recurrent asthma, and bacterial sinus infection, pneumonia, GERD, or Covid.  Covid is certainly possible, but given chronic nature of symptoms and no known exposures, we will not retest currently.  We will try a course of Augmentin to treat a possible bacterial sinusitis.  This could also treat a possible otitis media, although of course I cannot assess for the presence of middle ear infection over a virtual visit.  If he does have a pneumonia, Augmentin could help treat this as well, although his chest x-ray from October 2020 was within normal limits, and his symptoms have not worsened since then.  There could also possibly be a psychiatric component to this, although this is less likely.  We may need to refer him to allergy specialist if his symptoms do not abate with current treatment.

## 2019-09-28 ENCOUNTER — Other Ambulatory Visit: Payer: Self-pay | Admitting: Family Medicine

## 2019-10-11 ENCOUNTER — Other Ambulatory Visit: Payer: Self-pay | Admitting: Family Medicine

## 2019-10-22 ENCOUNTER — Other Ambulatory Visit: Payer: Self-pay | Admitting: Family Medicine

## 2019-10-22 ENCOUNTER — Telehealth: Payer: Self-pay

## 2019-10-22 DIAGNOSIS — J3089 Other allergic rhinitis: Secondary | ICD-10-CM

## 2019-10-22 NOTE — Telephone Encounter (Signed)
ENT referral sent.  Thanks.

## 2019-10-22 NOTE — Telephone Encounter (Signed)
Patients mom calls nurse line requesting a referral for ENT for patient. Patients previous referral expired. Please advise.

## 2019-10-23 NOTE — Telephone Encounter (Signed)
LVM for pt to call office back to inform him of below. Holten Spano Zimmerman Rumple, CMA  

## 2019-10-24 ENCOUNTER — Encounter: Payer: Self-pay | Admitting: Family Medicine

## 2019-10-24 ENCOUNTER — Other Ambulatory Visit: Payer: Self-pay | Admitting: Family Medicine

## 2019-10-24 MED ORDER — FLUTICASONE PROPIONATE 50 MCG/ACT NA SUSP: 2.0000 | Freq: Every day | NASAL | 2 refills | Status: DC

## 2019-10-24 NOTE — Telephone Encounter (Signed)
Contacted pt to inform him that the requested referral has been placed.  Pt had a very bad cough while on the phone and offered to set him up with a virtual visit at which time he declined. Informed him that if he changed his mind to call us back and we would get him a televisit. Kylian Loh Zimmerman Rumple, CMA

## 2019-10-29 ENCOUNTER — Encounter: Payer: Self-pay | Admitting: Family Medicine

## 2019-11-26 ENCOUNTER — Other Ambulatory Visit: Payer: Self-pay | Admitting: Otolaryngology

## 2019-11-26 DIAGNOSIS — R05 Cough: Secondary | ICD-10-CM

## 2019-11-26 DIAGNOSIS — R059 Cough, unspecified: Secondary | ICD-10-CM

## 2019-12-03 ENCOUNTER — Other Ambulatory Visit: Payer: Self-pay

## 2019-12-10 ENCOUNTER — Other Ambulatory Visit: Payer: Self-pay

## 2019-12-10 ENCOUNTER — Ambulatory Visit
Admission: RE | Admit: 2019-12-10 | Discharge: 2019-12-10 | Disposition: A | Discharge status: Home / Self Care | Payer: Self-pay | Source: Ambulatory Visit | Attending: Otolaryngology | Admitting: Otolaryngology

## 2019-12-10 DIAGNOSIS — R05 Cough: Secondary | ICD-10-CM

## 2019-12-10 DIAGNOSIS — R059 Cough, unspecified: Secondary | ICD-10-CM

## 2020-02-13 ENCOUNTER — Institutional Professional Consult (permissible substitution): Payer: No Typology Code available for payment source | Admitting: Internal Medicine

## 2020-02-25 ENCOUNTER — Other Ambulatory Visit: Payer: Self-pay

## 2020-02-25 ENCOUNTER — Ambulatory Visit (INDEPENDENT_AMBULATORY_CARE_PROVIDER_SITE_OTHER): Payer: Self-pay | Admitting: Internal Medicine

## 2020-02-25 ENCOUNTER — Encounter: Payer: Self-pay | Admitting: Internal Medicine

## 2020-02-25 DIAGNOSIS — R05 Cough: Secondary | ICD-10-CM

## 2020-02-25 DIAGNOSIS — R053 Chronic cough: Secondary | ICD-10-CM

## 2020-02-25 MED ORDER — TRAMADOL HCL 50 MG PO TABS: 50.0000 mg | ORAL_TABLET | ORAL | 0 refills | Status: AC | PRN

## 2020-02-25 MED ORDER — ESOMEPRAZOLE MAGNESIUM 40 MG PO CPDR: DELAYED_RELEASE_CAPSULE | ORAL | 2 refills | Status: DC

## 2020-02-25 MED ORDER — PREDNISONE 10 MG PO TABS: ORAL_TABLET | ORAL | 0 refills | Status: DC

## 2020-02-25 NOTE — Progress Notes (Signed)
Derrick Carey, male    DOB: 12/06/1990, 29 y.o.   MRN: [id number]   Brief patient profile:  28 yowm with Asberger's  minimal remote smoking hx childhood stuffy nose/  Took allergy shots helped some as did  quit smoking  then onset of cough  Mid feb 2020  While still working but hen  much worse in 12/2018 ? Flu with neg covid testing but stopped working at that point and persisted cough daily  neg w/u by [facility] 11/15/19 with nl sinus ct and no response to acid suppression so referred to pulmonary clinic 02/25/2020 by Dr   Lazarus Salines   Worked  [employer] since dropped out of college and last worked there 12/2018  [employer] 12 hours has worked the best but can't take for more than a few days s  feeling off balance/ twitchy   History of Present Illness  02/25/2020  Pulmonary/ 1st office eval/Derrick Carey  Chief Complaint  Patient presents with  . Consult    Referred by [facility] ENT Dr. Lazarus Salines for chronic cough for the past 33yr. Is exposed to dust particles at work. 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   Dyspnea:  Fine unless cough/ presyncope with severe coughing fits  Cough: severe dry quality/  better p lies down at hs  but still present then Sleep: does fine once sleep/ bed is flat/ 2 pillows - feels does best first thing in am and worse as day goes on SABA use: none   No obvious patterns in day to day or daytime variability or assoc excess/ purulent sputum or mucus plugs or hemoptysis or cp or chest tightness, subjective wheeze or overt sinus or hb symptoms.   Sleeping  without nocturnal  or early am exacerbation  of respiratory  c/o's or need for noct saba. Also denies any obvious fluctuation of symptoms with weather or environmental changes or other aggravating or alleviating factors except as outlined above   No unusual exposure hx or h/o childhood pna  or knowledge of premature birth.  Current Allergies, Complete Past Medical History, Past Surgical History, Family History, and Social  History were reviewed in [employer] record.  ROS  The following are not active complaints unless bolded Hoarseness, sore throat, dysphagia, dental problems, itching, sneezing,  nasal congestion or discharge of excess mucus or purulent secretions, ear ache,   fever, chills, sweats, unintended wt loss or wt gain, classically pleuritic or exertional cp,  orthopnea pnd or arm/hand swelling  or leg swelling, presyncope, palpitations, abdominal pain, anorexia, nausea, vomiting, diarrhea  or change in bowel habits or change in bladder habits, change in stools or change in urine, dysuria, hematuria,  rash, arthralgias, visual complaints, headache, numbness, weakness or ataxia or problems with walking or coordination,  change in mood or  memory.           Past Medical History:  Diagnosis Date  . ADHD (attention deficit hyperactivity disorder)   . Asperger syndrome   . GERD (gastroesophageal reflux disease)     Outpatient Medications Prior to Visit  Medication Sig Dispense Refill  . cetirizine (ZYRTEC) 10 MG tablet Take 1 tablet (10 mg total) by mouth daily.  off x months   . esomeprazole (NEXIUM) 40 MG capsule Take 1 capsule (40 mg total) by mouth daily.  randomly   . fluticasone (FLONASE) 50 MCG/ACT nasal spray Place 2 sprays into both nostrils daily. 1 spray in each nostril every day Not using    .  ibuprofen (ADVIL,MOTRIN) 800 MG tablet Take 1 tablet (800 mg total) by mouth every 8 (eight) hours as needed. 30 tablet 0  . methylphenidate (RITALIN) 10 MG tablet Take 1 tablet (10 mg total) by mouth every morning. 30 tablet 0  . benzonatate (TESSALON) 200 MG capsule Take 1 capsule (200 mg total) by mouth 2 (two) times daily as needed for cough. Not effective    . ondansetron (ZOFRAN ODT) 4 MG disintegrating tablet Take 1 tablet (4 mg total) by mouth every 8 (eight) hours as needed for nausea or vomiting. 20 tablet 0      Objective:     BP 118/74   Pulse 72   Temp 98.1 F  (36.7 C) (Temporal)   Ht 6\' 1"  (1.854 m)   Wt 262 lb 3.2 oz (118.9 kg)   SpO2 98% Comment: on RA  BMI 34.59 kg/m   SpO2: 98 %(on RA)   amb wm unusual affect, freq dry throat clearing/ here with his father   HEENT : pt wearing mask not removed for exam due to covid -19 concerns.    NECK :  without JVD/Nodes/TM/ nl carotid upstrokes bilaterally   LUNGS: no acc muscle use,  Nl contour chest which is clear to A and P bilaterally without cough on insp or exp maneuvers   CV:  RRR  no s3 or murmur or increase in P2, and no edema   ABD:  soft and nontender with nl inspiratory excursion in the supine position. No bruits or organomegaly appreciated, bowel sounds nl  MS:  Nl gait/ ext warm without deformities, calf tenderness, cyanosis or clubbing No obvious joint restrictions   SKIN: warm and dry without lesions    NEURO:  alert, approp, nl sensorium with  no motor or cerebellar deficits apparent.     I personally reviewed images and agree with radiology impression as follows:  CXR:  Pa and Lat 08/02/19 No active cardiopulmonary disease.    CT sinus 12/10/19  No active sinusitis  Labs ordered 02/25/2020  :  allergy profile  Did not go to lab  cxr 02/25/2020  Did not go to xray    Assessment   Chronic cough Onset ? 06/2019 - ENT eval per [facility] neg 02/02/39 included neg sinus ct  Did not complete the w/u as requested.   The most common causes of chronic cough in immunocompetent adults include the following: upper airway cough syndrome (UACS), previously referred to as postnasal drip syndrome (PNDS), which is caused by variety of rhinosinus conditions; (2) asthma; (3) GERD; (4) chronic bronchitis from cigarette smoking or other inhaled environmental irritants; (5) nonasthmatic eosinophilic bronchitis; and (6) bronchiectasis.   These conditions, singly or in combination, have accounted for up to 94% of the causes of chronic cough in prospective studies.   Other conditions have  constituted no >6% of the causes in prospective studies These have included bronchogenic carcinoma, chronic interstitial pneumonia, sarcoidosis, left ventricular failure, ACEI-induced cough, and aspiration from a condition associated with pharyngeal dysfunction.    Chronic cough is often simultaneously caused by more than one condition. A single cause has been found from 38 to 82% of the time, multiple causes from 18 to 62%. Multiply caused cough has been the result of three diseases up to 42% of the time.       Of the three most common causes of  Sub-acute / recurrent or chronic cough, only one (GERD)  can actually contribute to/ trigger  the other two (asthma  and post nasal drip syndrome)  and perpetuate the cylce of cough.  While not intuitively obvious, many patients with chronic low grade reflux do not cough until there is a primary insult that disturbs the protective epithelial barrier and exposes sensitive nerve endings.   This is typically viral but can due to PNDS and  either may apply here.   The point is that once this occurs, it is difficult to eliminate the cycle  using anything but a maximally effective acid suppression regimen at least in the short run, accompanied by an appropriate diet to address non acid GERD and control any pnds  with 1st gen H1 blockers per guidelines / eliminate the cough itself for at least 3 days with tramadol plus also  added 6 days of Prednisone in case of component of Th-2 driven upper or lower airways inflammation (if cough responds short term only to relapse befor return while will on rx for uacs that would point to allergic rhinitis/ asthma or eos bronchitis)   Advised pt and father: The standardized cough guidelines published in Chest by [place] in 2006 are still the best available and consist of a multiple step process (up to 12!) , not a single office visit,  and are intended  to address this problem logically,  with an alogrithm dependent on  response to empiric treatment at  each progressive step  to determine a specific diagnosis with  minimal addtional testing needed. Therefore if adherence is an issue or can't be accurately verified,  it's very unlikely the standard evaluation and treatment will be successful here.    Furthermore, response to therapy (other than acute cough suppression, which should only be used short term with avoidance of narcotic containing cough syrups if possible), can be a gradual process for which the patient is not likely to  perceive immediate benefit.  Unlike going to an eye doctor where the best perscription is almost always the first one and is immediately effective, this is almost never the case in the management of chronic cough syndromes. Therefore the patient needs to commit up front to consistently adhere to recommendations  for up to 6 weeks of therapy directed at the likely underlying problem(s) before the response can be reasonably evaluated.    >>> f/u in 2 weeks to regroup / complete the w/u since left s getting req studies (if still coughing/ o/w can omit)        Each maintenance medication was reviewed in detail including emphasizing most importantly the difference between maintenance and prns and under what circumstances the prns are to be triggered using an action plan format where appropriate.  Total time for H and P, chart review, counseling, teaching device and generating customized AVS unique to this office visit / charting = 60 min           Sandrea Hughs, MD 02/25/2020

## 2020-02-25 NOTE — Patient Instructions (Addendum)
Stop zyrtec   For drainage / throat tickle try take CHLORPHENIRAMINE  4 mg  (Chlortab 4mg   at should be easiest to find in the green box)  take one every 4 hours as needed - available over the counter- may cause drowsiness so start with just a bedtime dose or two and see how you tolerate it before trying in daytime    Increase nexium to 40 mg Take 30- 60 min before your first and last meals of the day   Take delsym one tsp every 12 hours and supplement if needed with  tramadol 50 mg up to 2 every 4 hours to suppress the urge to cough. Swallowing water and/or using ice chips/non mint and menthol containing candies (such as lifesavers or sugarless jolly ranchers) are also effective.  You should rest your voice and avoid activities that you know make you cough.  Once you have eliminated the cough for 3 straight days try reducing the tramadol first,  then the delsym as tolerated.     GERD (REFLUX)  is an extremely common cause of respiratory symptoms just like yours , many times with no obvious heartburn at all.    It can be treated with medication, but also with lifestyle changes including elevation of the head of your bed (ideally with 6 -8inch blocks under the headboard of your bed),  Smoking cessation, avoidance of late meals, excessive alcohol, and avoid fatty foods, chocolate, peppermint, colas, red wine, and acidic juices such as orange juice.  NO MINT OR MENTHOL PRODUCTS SO NO COUGH DROPS  USE SUGARLESS CANDY INSTEAD (Jolley ranchers or Stover's or Life Savers) or even ice chips will also do - the key is to swallow to prevent all throat clearing. NO OIL BASED VITAMINS - use powdered substitutes.  Avoid fish oil when coughing.  Prednisone 10 mg take  4 each am x 2 days,   2 each am x 2 days,  1 each am x 2 days and stop    Please remember to go to the lab and x-ray department   for your tests - we will call you with the results when they are available.  Please schedule a  follow up office visit in 2 weeks, sooner if needed

## 2020-02-26 ENCOUNTER — Encounter: Payer: Self-pay | Admitting: Internal Medicine

## 2020-02-26 NOTE — Assessment & Plan Note (Addendum)
Onset ? 06/2019 - ENT eval per [facility] neg 11/15/19 included neg sinus ct  Did not complete the w/u as requested.   The most common causes of chronic cough in immunocompetent adults include the following: upper airway cough syndrome (UACS), previously referred to as postnasal drip syndrome (PNDS), which is caused by variety of rhinosinus conditions; (2) asthma; (3) GERD; (4) chronic bronchitis from cigarette smoking or other inhaled environmental irritants; (5) nonasthmatic eosinophilic bronchitis; and (6) bronchiectasis.   These conditions, singly or in combination, have accounted for up to 94% of the causes of chronic cough in prospective studies.   Other conditions have constituted no >6% of the causes in prospective studies These have included bronchogenic carcinoma, chronic interstitial pneumonia, sarcoidosis, left ventricular failure, ACEI-induced cough, and aspiration from a condition associated with pharyngeal dysfunction.    Chronic cough is often simultaneously caused by more than one condition. A single cause has been found from 38 to 82% of the time, multiple causes from 18 to 62%. Multiply caused cough has been the result of three diseases up to 42% of the time.       Of the three most common causes of  Sub-acute / recurrent or chronic cough, only one (GERD)  can actually contribute to/ trigger  the other two (asthma and post nasal drip syndrome)  and perpetuate the cylce of cough.  While not intuitively obvious, many patients with chronic low grade reflux do not cough until there is a primary insult that disturbs the protective epithelial barrier and exposes sensitive nerve endings.   This is typically viral but can due to PNDS and  either may apply here.   The point is that once this occurs, it is difficult to eliminate the cycle  using anything but a maximally effective acid suppression regimen at least in the short run, accompanied by an appropriate diet to address non acid GERD and  control any pnds  with 1st gen H1 blockers per guidelines  / eliminate the cough itself for at least 3 days with tramadol plus also  added 6 days of Prednisone in case of component of Th-2 driven upper or lower airways inflammation (if cough responds short term only to relapse befor return while will on rx for uacs that would point to allergic rhinitis/ asthma or eos bronchitis)   Advised pt and father: The standardized cough guidelines published in Chest by [place] in 2006 are still the best available and consist of a multiple step process (up to 12!) , not a single office visit,  and are intended  to address this problem logically,  with an alogrithm dependent on response to empiric treatment at  each progressive step  to determine a specific diagnosis with  minimal addtional testing needed. Therefore if adherence is an issue or can't be accurately verified,  it's very unlikely the standard evaluation and treatment will be successful here.    Furthermore, response to therapy (other than acute cough suppression, which should only be used short term with avoidance of narcotic containing cough syrups if possible), can be a gradual process for which the patient is not likely to  perceive immediate benefit.  Unlike going to an eye doctor where the best perscription is almost always the first one and is immediately effective, this is almost never the case in the management of chronic cough syndromes. Therefore the patient needs to commit up front to consistently adhere to recommendations  for up to 6 weeks of therapy directed at  the likely underlying problem(s) before the response can be reasonably evaluated.    >>> f/u in 2 weeks to regroup / complete the w/u since left s getting req studies (if still coughing/ o/w can omit)          Each maintenance medication was reviewed in detail including emphasizing most importantly the difference between maintenance and prns and under what circumstances  the prns are to be triggered using an action plan format where appropriate.  Total time for H and P, chart review, counseling, teaching device and generating customized AVS unique to this office visit / charting = 60 min

## 2020-03-24 ENCOUNTER — Ambulatory Visit: Payer: No Typology Code available for payment source | Admitting: Internal Medicine

## 2020-05-19 ENCOUNTER — Ambulatory Visit (INDEPENDENT_AMBULATORY_CARE_PROVIDER_SITE_OTHER): Payer: No Typology Code available for payment source

## 2020-05-19 ENCOUNTER — Other Ambulatory Visit: Payer: Self-pay

## 2020-05-19 ENCOUNTER — Encounter: Payer: Self-pay | Admitting: Internal Medicine

## 2020-05-19 ENCOUNTER — Ambulatory Visit (INDEPENDENT_AMBULATORY_CARE_PROVIDER_SITE_OTHER): Payer: Self-pay | Admitting: Internal Medicine

## 2020-05-19 DIAGNOSIS — R053 Chronic cough: Secondary | ICD-10-CM

## 2020-05-19 DIAGNOSIS — R05 Cough: Secondary | ICD-10-CM

## 2020-05-19 MED ORDER — GABAPENTIN 100 MG PO CAPS: 100.0000 mg | ORAL_CAPSULE | Freq: Four times a day (QID) | ORAL | 2 refills | Status: DC

## 2020-05-19 NOTE — Progress Notes (Signed)
Derrick Carey, male    DOB: [birth year], 29 y.o.   MRN: [id number]   Brief patient profile:  29 yowm with Asberger's  minimal remote smoking hx childhood stuffy nose/  Took allergy shots helped some as did  quit smoking then onset of cough  Mid feb 2020  while still working but then  much worse in 12/2018 ? 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 Consult    Referred by [facility] ENT Dr. Lazarus Salines for chronic cough for the past 72yr. Is exposed to dust particles at work. 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   Dyspnea:  Fine unless cough/ presyncope with severe coughing fits  Cough: severe dry quality/  better p lies down at hs  but still present then Sleep: does fine once sleep/ bed is flat/ 2 pillows - feels does best first thing in am and worse as day goes on SABA use: none  rec Stop zyrtec  For drainage / throat tickle try take CHLORPHENIRAMINE  4 mg  (Chlortab 4mg   at )  Increase nexium to 40 mg Take 30- 60 min before your first and last meals of the day  Take delsym one tsp every 12 hours and supplement if needed with  tramadol 50 mg up to 2 every 4 hours to suppress the urge to cough.  Once you have eliminated the cough for 3 straight days try reducing the tramadol first,  then the delsym as tolerated.   GERD  Diet  Prednisone 10 mg take  4 each am x 2 days,   2 each am x 2 days,  1 each am x 2 days and stop      05/19/2020  f/u ov/Derrick Carey re: cough x 12/2018 / made worse on exp to spices / chlorpheniramine seems to help when remembers to take it  Chief Complaint  Patient presents with  . Follow-up    chronic cough  Dyspnea:  Says breathing is heavy even when not coughing  X one  block sometime has to stop  Cough: reduced but day > noct dry quality  Sleeping: not aware of cough but wakes up with sore throat on nexium bid ac  SABA use: none  02: none    No obvious day to day or daytime variability or assoc excess/ purulent sputum or mucus plugs or hemoptysis or cp or chest tightness, subjective wheeze or overt sinus or hb symptoms.   Sleeping  without nocturnal    exacerbation  of respiratory  c/o's or need for noct saba. Also denies any obvious fluctuation of symptoms with weather or environmental changes or other aggravating or alleviating factors except as outlined above   No unusual exposure hx or h/o childhood pna/ asthma or knowledge of premature birth.  Current Allergies, Complete Past Medical History, Past Surgical History, Family History, and Social  History were reviewed in [facility] electronic medical record.  ROS  The following are not active complaints unless bolded Hoarseness, sore throat, dysphagia, dental problems, itching, sneezing,  nasal congestion or discharge of excess mucus or purulent secretions, ear ache,   fever, chills, sweats, unintended wt loss or wt gain, classically pleuritic or exertional cp,  orthopnea pnd or arm/hand swelling  or leg swelling, presyncope, palpitations, abdominal pain, anorexia, nausea, vomiting, diarrhea  or change in bowel habits or change in bladder habits, change in stools or change in urine, dysuria, hematuria,  rash, arthralgias, visual complaints, headache, numbness, weakness or ataxia or problems with walking or coordination,  change in mood or  memory.        Current Meds  Medication Sig  . esomeprazole (NEXIUM) 40 MG capsule Take 30-  60 min before your first and last meals of the day  . fluticasone (FLONASE) 50 MCG/ACT nasal spray Place 2 sprays into both nostrils daily. 1 spray in each nostril every day  . ibuprofen (ADVIL,MOTRIN) 800 MG tablet Take 1 tablet (800 mg total) by mouth every 8 (eight) hours as needed.  . methylphenidate (RITALIN) 10 MG tablet Take 1 tablet (10 mg total) by mouth every morning.  . predniSONE (DELTASONE) 10 MG tablet Take  4 each am x 2 days,   2 each am x 2 days,  1 each am x 2 days and stop               Past Medical History:  Diagnosis Date  . ADHD (attention deficit hyperactivity disorder)   . Asperger syndrome   . GERD (gastroesophageal reflux disease)        Objective:       Wt Readings from Last 3 Encounters:  05/19/20 272 lb 12.8 oz (123.7 kg)  02/25/20 262 lb 3.2 oz (118.9 kg)  12/13/18 260 lb (117.9 kg)     Vital signs reviewed - Note on arrival 05/19/2020  02 sats  97% on RA     amb wm / unusual affect, non-stop throat clearing and dry coughing   HEENT : pt wearing mask not removed for exam due to covid -19 concerns.    NECK :  without JVD/Nodes/TM/ nl carotid upstrokes bilaterally   LUNGS: no acc muscle use,  Nl contour chest which is clear to A and P bilaterally without cough on insp or exp maneuvers   CV:  RRR  no s3 or murmur or increase in P2, and no edema   ABD:  soft and nontender with nl inspiratory excursion in the supine position. No bruits or organomegaly appreciated, bowel sounds nl  MS:  Nl gait/ ext warm without deformities, calf tenderness, cyanosis or clubbing No obvious joint restrictions   SKIN: warm and dry without lesions    NEURO:  alert, approp, nl sensorium with  no motor or cerebellar deficits apparent.    CXR PA and Lateral:   05/19/2020 :    I personally reviewed images and agree with radiology impression as follows:   No active cardiopulmonary disease.       Assessment

## 2020-05-19 NOTE — Patient Instructions (Signed)
Continue chlortabs as needed for itchy sneezing/runny nose  Up to every 4 hours as needed    Gabapentin 100 mg four times a day    Please schedule a follow up office visit in 6-8  weeks, call sooner if needed

## 2020-05-20 ENCOUNTER — Telehealth: Payer: Self-pay | Admitting: Internal Medicine

## 2020-05-20 ENCOUNTER — Encounter: Payer: Self-pay | Admitting: Internal Medicine

## 2020-05-20 LAB — IGE: IgE (Immunoglobulin E), Serum: 15 kU/L (ref <=114)

## 2020-05-20 LAB — CBC WITH DIFFERENTIAL/PLATELET
Basophils Absolute: 0.1 10*3/uL (ref 0.0–0.1)
Basophils Relative: 0.6 % (ref 0.0–3.0)
Eosinophils Absolute: 0.2 10*3/uL (ref 0.0–0.7)
Eosinophils Relative: 2.3 % (ref 0.0–5.0)
HCT: 45.8 % (ref 39.0–52.0)
Hemoglobin: 15.5 g/dL (ref 13.0–17.0)
Lymphocytes Relative: 23.3 % (ref 12.0–46.0)
Lymphs Abs: 2 10*3/uL (ref 0.7–4.0)
MCHC: 33.8 g/dL (ref 30.0–36.0)
MCV: 86.2 fl (ref 78.0–100.0)
Monocytes Absolute: 0.8 10*3/uL (ref 0.1–1.0)
Monocytes Relative: 8.9 % (ref 3.0–12.0)
Neutro Abs: 5.7 10*3/uL (ref 1.4–7.7)
Neutrophils Relative %: 64.9 % (ref 43.0–77.0)
Platelets: 152 10*3/uL (ref 150.0–400.0)
RBC: 5.31 Mil/uL (ref 4.22–5.81)
RDW: 13.5 % (ref 11.5–15.5)
WBC: 8.7 10*3/uL (ref 4.0–10.5)

## 2020-05-20 MED ORDER — GABAPENTIN 100 MG PO CAPS: 100.0000 mg | ORAL_CAPSULE | Freq: Four times a day (QID) | ORAL | 2 refills | Status: DC

## 2020-05-20 NOTE — Progress Notes (Signed)
Called and left detailed message on voicemail per DPR. Advised to please feel free to call back if he had any questions. Nothing further needed at this time.

## 2020-05-20 NOTE — Assessment & Plan Note (Signed)
Onset ? 06/2019 - ENT eval per [facility] neg 11/15/19 included neg sinus ct - 05/19/2020 some better p max gerd/ 1st gen H1 blockers per guidelines  > add gabapentin 100 qid   Upper airway cough syndrome (previously labeled PNDS),  is so named because it's frequently impossible to sort out how much is  CR/sinusitis with freq throat clearing (which can be related to primary GERD)   vs  causing  secondary (" extra esophageal")  GERD from wide swings in gastric pressure that occur with throat clearing, often  promoting self use of mint and menthol lozenges that reduce the lower esophageal sphincter tone and exacerbate the problem further in a cyclical fashion.   These are the same pts (now being labeled as having "irritable larynx syndrome/habitual cough" by some cough centers) who not infrequently have a history of having failed to tolerate ace inhibitors,  dry powder inhalers or biphosphonates or report having atypical/extraesophageal reflux symptoms that don't respond to standard doses of PPI  and are easily confused as having aecopd or asthma flares by even experienced allergists/ pulmonologists (myself included).    >>> next step is add gabapentin 100 mg qid   Discussed in detail all the  indications, usual  risks and alternatives  relative to the benefits with patient who agrees to proceed with Rx as outlined.

## 2020-05-20 NOTE — Progress Notes (Signed)
Called and left voicemail to return call to get xray results.

## 2020-05-20 NOTE — Progress Notes (Signed)
Called and left detailed message on voicemail per DPR on xray result per Dr Sherene Sires. Advised patient to call back if he had any questions. Nothing further needed at this time.

## 2020-05-20 NOTE — Progress Notes (Signed)
Add to previous note: Called and left detailed message on voicemail per DPR on lab results per Dr Sherene Sires.

## 2020-05-20 NOTE — Telephone Encounter (Signed)
Dr. Sherene Sires please clarify instructions on Gabapentin. Both 4 times daily and 3 times daily were written in order. Thank you.

## 2020-05-20 NOTE — Telephone Encounter (Signed)
Called and spoke with Casimiro Needle at [facility] about Gabapentin RX sent over. New RX has been sent with correct instructions. Nothing further needed at this time.

## 2020-05-20 NOTE — Telephone Encounter (Signed)
Gabapentin is 100 qid

## 2020-05-22 ENCOUNTER — Encounter: Payer: Self-pay | Admitting: Family Medicine

## 2020-07-07 ENCOUNTER — Other Ambulatory Visit: Payer: Self-pay

## 2020-07-07 ENCOUNTER — Ambulatory Visit (INDEPENDENT_AMBULATORY_CARE_PROVIDER_SITE_OTHER): Payer: Self-pay | Admitting: Family Medicine

## 2020-07-07 ENCOUNTER — Encounter: Payer: Self-pay | Admitting: Family Medicine

## 2020-07-07 VITALS — BP 118/80 | HR 92 | Wt 276.0 lb

## 2020-07-07 DIAGNOSIS — Z113 Encounter for screening for infections with a predominantly sexual mode of transmission: Secondary | ICD-10-CM

## 2020-07-07 DIAGNOSIS — F902 Attention-deficit hyperactivity disorder, combined type: Secondary | ICD-10-CM

## 2020-07-07 MED ORDER — METHYLPHENIDATE HCL 10 MG PO TABS: 10.0000 mg | ORAL_TABLET | Freq: Every morning | ORAL | 0 refills | Status: DC

## 2020-07-07 NOTE — Patient Instructions (Signed)
Great to see you today, I have refilled your Ritalin for today.  Please follow-up with me in 2 weeks time.  I would like to see how you are doing with the new medication.  The goal is to get you to a more functional way of living.  We can consider other medications in the future and refer you to psychiatry if necessary.  Please follow-up with pulmonology for your chronic cough.  You can request pulmonary function tests.  If you do feel suicidal then please call us urgently or call the suicide hotline.  National Suicide Prevention Lifeline Hours: Available 24 hours. Languages: [place], [place]. Learn more [phone]  Best wishes, Allena Katz

## 2020-07-07 NOTE — Progress Notes (Addendum)
° ° °  SUBJECTIVE:   CHIEF COMPLAINT / HPI:   Derrick Carey is a 29 yr old male who presents for meds refill  ADHD Pt would like Ritalin refill for his ADHD. It was last refilled in 2019 but pt did not start this as he couldn't not afford the medication. He has been off Ritalin since 2017. He has "brain fog" and poor focus. Is currently unemployed and his ADHD symptoms have made working difficult. Has not seen Psychiatry about his ADHD.  Mood disturbance PHQ9-19. Question 9 was positive. I asked Tuck more about this. He denies active or passive suicidal ideations. He reported "I get random thoughts about suicide sometimes and then they just f**k off" whilst laughing. He denies previous suicide attempts, thought of self harm etc. His dad who was also present at the visit said that he does not have suicidal ideations. He has a good relationship with Derrick Carey    PERTINENT  PMH / PSH: ADHD, allergic rhinitis, acne   OBJECTIVE:   BP 118/80    Pulse 92    Wt 276 lb (125.2 kg)    SpO2 97%    BMI 36.41 kg/m    General: Alert, no acute distress, well appearing  Cardio: Normal S1 and S2, RRR Pulm: CTAB, normal WOB  Extremities: No peripheral edema. Warm/ well perfused.  Strong radial pulse Neuro: Cranial nerves grossly intact,  ASSESSMENT/PLAN:   ADHD (attention deficit hyperactivity disorder) Refilled Ritalin 10mg  today. Will increase dose as able. I suspect many of the symptoms which are positive on PHQ9 such as insomnia, poor concentration, little energy are 2/2 ADHD so it is difficult to say he depressed. PHQ 9 question 9 is positive, however clarified he does not have true active or passive suicidal ideation. Will defer prescribing antidepressant today for this reason and as it is my first time meeting Derrick Carey. I have a follow up arranged on 8th October where we can discuss his response to Ritalin and need for antidepressants.   Obtained routine Hepatitis C reflex antibody screening  today.  10th October, MD PGY2 [facility]

## 2020-07-08 LAB — HEPATITIS C ANTIBODY: Hep C Virus Ab: 0.1 s/co ratio (ref 0.0–0.9)

## 2020-07-09 NOTE — Assessment & Plan Note (Addendum)
Refilled Ritalin 10mg  today. Will increase dose as able. I suspect many of the symptoms which are positive on PHQ9 such as insomnia, poor concentration, little energy are 2/2 ADHD so it is difficult to say he depressed. PHQ 9 question 9 is positive, however clarified he does not have true active or passive suicidal ideation. Will defer prescribing antidepressant today for this reason and as it is my first time meeting Derrick Carey. I have a follow up arranged on 8th October where we can discuss his response to Ritalin and need for antidepressants.

## 2020-07-14 ENCOUNTER — Ambulatory Visit (INDEPENDENT_AMBULATORY_CARE_PROVIDER_SITE_OTHER): Payer: Self-pay | Admitting: Internal Medicine

## 2020-07-14 ENCOUNTER — Other Ambulatory Visit: Payer: Self-pay

## 2020-07-14 ENCOUNTER — Encounter: Payer: Self-pay | Admitting: Internal Medicine

## 2020-07-14 DIAGNOSIS — R053 Chronic cough: Secondary | ICD-10-CM

## 2020-07-14 DIAGNOSIS — R05 Cough: Secondary | ICD-10-CM

## 2020-07-14 MED ORDER — GABAPENTIN 300 MG PO CAPS: 300.0000 mg | ORAL_CAPSULE | Freq: Three times a day (TID) | ORAL | 2 refills | Status: DC

## 2020-07-14 NOTE — Progress Notes (Signed)
Derrick Carey, Derrick Carey    DOB: [birth year]    MRN: [id number]   Brief patient profile:  29 yowm with Asberger's  minimal remote smoking hx childhood stuffy nose/  Took allergy shots helped some as did  quit smoking then onset of cough  Mid feb 2020  while still working but then  much worse in 12/2018 ? 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 Consult    Referred by [facility] ENT Dr. Lazarus Salines for chronic cough for the past 47yr. Is exposed to dust particles at work. 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   Dyspnea:  Fine unless cough/ presyncope with severe coughing fits  Cough: severe dry quality/  better p lies down at hs  but still present then Sleep: does fine once sleep/ bed is flat/ 2 pillows - feels does best first thing in am and worse as day goes on SABA use: none  rec Stop zyrtec  For drainage / throat tickle try take CHLORPHENIRAMINE  4 mg  (Chlortab 4mg   at )  Increase nexium to 40 mg Take 30- 60 min before your first and last meals of the day  Take delsym one tsp every 12 hours and supplement if needed with  tramadol 50 mg up to 2 every 4 hours to suppress the urge to cough.  Once you have eliminated the cough for 3 straight days try reducing the tramadol first,  then the delsym as tolerated.   GERD  Diet  Prednisone 10 mg take  4 each am x 2 days,   2 each am x 2 days,  1 each am x 2 days and stop     05/19/2020   f/u ov/[name] re: cough x 12/2018 / made worse on exp to spices / chlorpheniramine seems to help when remembers to take it / pred no better  Chief Complaint  Patient presents with  . Follow-up    chronic cough  Dyspnea:  Says breathing is heavy even when not coughing  X one  block sometime has to stop  Cough: reduced but day > noct dry quality  Sleeping: not aware of cough but wakes up with sore throat on nexium bid ac  SABA use: none  02: none  rec Continue chlortabs as needed for itchy sneezing/runny nose  Up to every 4 hours as needed  Gabapentin 100 mg four times a day    07/14/2020  f/u ov/Derrick Carey re:  Cough x 11/2018  Gabapentin 100 tid  And taking ppi bid and h1 bid  Chief Complaint  Patient presents with  . Follow-up  Dyspnea:  Mostly with coughing fits  Cough: dry day > noct  And esp worse with voice use or exp to strong smells  Sleeping: not aware that cough is waking him up   SABA use: none  02: none  No obvious day to day or daytime variability or assoc excess/ purulent sputum or mucus plugs or hemoptysis or cp or chest tightness, subjective wheeze or overt sinus or hb symptoms.   He perceives he's sleeping  without nocturnal  or early am exacerbation  of respiratory  c/o's or need for noct saba. Also denies any obvious fluctuation of symptoms with weather or environmental changes or other aggravating or alleviating factors except as outlined above   No unusual exposure hx or h/o childhood pna/ asthma or knowledge of premature birth.  Current Allergies, Complete Past Medical History, Past Surgical History, Family History, and Social History were reviewed in [employer] record.  ROS  The following are not active complaints unless bolded Hoarseness, sore throat, dysphagia, dental problems, itching, sneezing,  nasal congestion or discharge of excess mucus or purulent secretions, ear ache,   fever, chills, sweats, unintended wt loss or wt gain, classically  pleuritic or exertional cp,  orthopnea pnd or arm/hand swelling  or leg swelling, presyncope, palpitations, abdominal pain, anorexia, nausea, vomiting, diarrhea  or change in bowel habits or change in bladder habits, change in stools or change in urine, dysuria, hematuria,  rash, arthralgias, visual complaints, headache, numbness, weakness or ataxia or problems with walking or coordination,  change in mood or  memory.        Current Meds  Medication Sig  . esomeprazole (NEXIUM) 40 MG capsule Take 30- 60 min before your first and last meals of the day  . fluticasone (FLONASE) 50 MCG/ACT nasal spray Place 2 sprays into both nostrils daily. 1 spray in each nostril every day  . gabapentin (NEURONTIN) 100 MG capsule Take 1 capsule (100 mg total) by mouth 4 (four) times daily.  Marland Kitchen ibuprofen (ADVIL,MOTRIN) 800 MG tablet Take 1 tablet (800 mg total) by mouth every 8 (eight) hours as needed.  . methylphenidate (RITALIN) 10 MG tablet Take 1 tablet (10 mg total) by mouth every morning.  .            Past Medical History:  Diagnosis Date  . ADHD (attention deficit hyperactivity disorder)   . Asperger syndrome   . GERD (gastroesophageal reflux disease)        Objective:      07/14/2020       277   05/19/20 272 lb 12.8 oz (123.7 kg)  02/25/20 262 lb 3.2 oz (118.9 kg)  12/13/18 260 lb (117.9 kg)    amb obese wm nad until starts coughing and can't stop   Vital signs reviewed  07/14/2020  - Note at rest 02 sats  99% on RA    HEENT : pt wearing mask not removed for exam due to covid -19 concerns.    NECK :  without JVD/Nodes/TM/ nl carotid upstrokes bilaterally   LUNGS: no acc muscle use,  Nl contour chest which is clear to A and P bilaterally without cough on insp or exp maneuvers   CV:  RRR  no s3 or murmur or increase in P2, and no edema   ABD:  soft and nontender with nl inspiratory excursion in the supine position. No bruits or organomegaly appreciated, bowel sounds nl  MS:  Nl  gait/ ext warm without deformities, calf tenderness, cyanosis or clubbing No obvious joint restrictions   SKIN: warm and dry without lesions    NEURO:  alert, approp, nl sensorium with  no motor or cerebellar deficits apparent.          I personally  reviewed images and agree with radiology impression as follows:  CXR:   05/20/20 No active cardiopulmonary disease.     Assessment

## 2020-07-14 NOTE — Patient Instructions (Signed)
Increase gabapentin to 100 mg x 2 and three times until you use them up  When you run out fill prescription for 300 mg three times a day   Change chlorpheniramine to where take on after supper and two at bedtime    GERD (REFLUX)  is an extremely common cause of respiratory symptoms just like yours , many times with no obvious heartburn at all.    It can be treated with medication, but also with lifestyle changes including elevation of the head of your bed (ideally with 6 -8inch blocks under the headboard of your bed),  Smoking cessation, avoidance of late meals, excessive alcohol, and avoid fatty foods, chocolate, peppermint, colas, red wine, and acidic juices such as orange juice.  NO MINT OR MENTHOL PRODUCTS SO NO COUGH DROPS  USE SUGARLESS CANDY INSTEAD (Jolley ranchers or Stover's or Life Savers) or even ice chips will also do - the key is to swallow to prevent all throat clearing. NO OIL BASED VITAMINS - use powdered substitutes.  Avoid fish oil when coughing.  If not better, next step is let me refer you to Dr Harriette Ohara

## 2020-07-14 NOTE — Assessment & Plan Note (Signed)
Onset ? 06/2019 - ENT eval per [facility] neg 11/15/19 included neg sinus ct -Allergy profile  05/19/20  >  Eos 0.2 /  IgE  15  - 05/19/2020 some better p max gerd/ 1st gen H1 blockers per guidelines  > add gabapentin 100 tid  - 07/14/2020 increased gabapentin to 300 mg tid and  h1 p supper and hs only  (otherwise ? Make him too sleepy in daytime)   Of the three most common causes of  Sub-acute / recurrent or chronic cough, only one (GERD)  can actually contribute to/ trigger  the other two (asthma and post nasal drip syndrome)  and perpetuate the cylce of cough.  While not intuitively obvious, many patients with chronic low grade reflux do not cough until there is a primary insult that disturbs the protective epithelial barrier and exposes sensitive nerve endings.   This is typically viral but can due to PNDS and  either may apply here.   The point is that once this occurs, it is difficult to eliminate the cycle  using anything but a maximally effective acid suppression regimen at least in the short run, accompanied by an appropriate diet to address non acid GERD and control / eliminate the cough itself with gabapentin titrated to as high as 300mg  tid with pm use of 1st gen H1 blockers per guidelines  And if not improving refer to [facility]/ Dr .          Each maintenance medication was reviewed in detail including emphasizing most importantly the difference between maintenance and prns and under what circumstances the prns are to be triggered using an action plan format where appropriate.  Total time for H and P, chart review, counseling,   and generating customized AVS unique to this office visit / charting = 25 min

## 2020-07-23 ENCOUNTER — Other Ambulatory Visit: Payer: Self-pay | Admitting: Family Medicine

## 2020-07-23 DIAGNOSIS — R059 Cough, unspecified: Secondary | ICD-10-CM

## 2020-07-25 ENCOUNTER — Ambulatory Visit: Payer: No Typology Code available for payment source | Admitting: Family Medicine

## 2020-08-25 NOTE — Progress Notes (Signed)
° ° °  SUBJECTIVE:   CHIEF COMPLAINT / HPI:   Derrick Carey is a 29 yr old male who presents for ADHD follow up  ADHD Seen in clinic on 07/07/20 for uncontrolled symptoms of ADHD, restarted Ritalin 10mg  due to patient's request  PH9 was 19 at that time but symptoms were overlapping with uncontrolled ADHD. Since then patient endorses a huge improvement in his symptoms. His focus, concentration and mood has improved greatly. He has tolerated Ritalin well. Denies side effects. Would like a refill.   Low mood Mood has improved significantly. Denies suicidal ideation or thoughts of self harm.  PERTINENT  PMH / PSH: ADHD, seasonal allergies   OBJECTIVE:   BP 118/72    Pulse 97    Ht 5\' 10"  (1.778 m)    Wt 271 lb 12.8 oz (123.3 kg)    SpO2 98%    BMI 39.00 kg/m    General: Alert, no acute distress,appears stated age Cardio: well perfused  Pulm: normal WOB Neuro: Cranial nerves grossly intact  ASSESSMENT/PLAN:   ADHD (attention deficit hyperactivity disorder) ADHD symptoms improved significantly since starting Ritalin at previous visit. PHQ improved to 5, low concern for depression following resolving ADHD symptoms. PDMP reviewed and refilled Ritalin 10mg  for another month. Follow up in 3 months or sooner if patient requires.     , MD PGY-2 [facility]

## 2020-08-26 ENCOUNTER — Ambulatory Visit (INDEPENDENT_AMBULATORY_CARE_PROVIDER_SITE_OTHER): Payer: Self-pay | Admitting: Family Medicine

## 2020-08-26 ENCOUNTER — Encounter: Payer: Self-pay | Admitting: Family Medicine

## 2020-08-26 ENCOUNTER — Other Ambulatory Visit: Payer: Self-pay

## 2020-08-26 DIAGNOSIS — F902 Attention-deficit hyperactivity disorder, combined type: Secondary | ICD-10-CM

## 2020-08-26 MED ORDER — METHYLPHENIDATE HCL 10 MG PO TABS: 10.0000 mg | ORAL_TABLET | Freq: Every morning | ORAL | 0 refills | Status: DC

## 2020-08-26 NOTE — Patient Instructions (Signed)
Epic, I am glad to hear you are doing much better after starting the ritalin!! I have refilled this for you. Let me know if you have any questions about your ADHD and you can see me as and when you need to.   Best wishes  Dr Allena Katz

## 2020-08-27 NOTE — Assessment & Plan Note (Addendum)
ADHD symptoms improved significantly since starting Ritalin at previous visit. PHQ improved to 5, low concern for depression following resolving ADHD symptoms. PDMP reviewed and refilled Ritalin 10mg  for another month. Follow up in 3 months or sooner if patient requires.

## 2020-10-27 NOTE — Patient Instructions (Signed)
Thank you for coming to see me today. It was a pleasure.   You should quarantine (isolate at home) until the following criteria are met: At least 10 days since symptoms first appeared and At least 24 hours with no fever without fever-reducing medication and Other symptoms of COVID-19 are improving (Loss of taste and smell may persist for weeks or months after recovery and need not delay the end of isolation)  When to seek emergency medical attention Look for emergency warning signs* for COVID-19. If your or someone you know is showing any of these signs, seek emergency medical care immediately:  Trouble breathing Persistent pain or pressure in the chest New confusion Inability to wake or stay awake Bluish lips or face Inability to tolerate fluids This list is not all possible symptoms. We discussed additional symptoms   Call 911 or call ahead to your local emergency facility: Notify the operator that you are seeking care for someone who has or may have COVID-19.   Please follow-up with PCP as needed  If you have any questions or concerns, please do not hesitate to call the office at [phone].  Best,   Carollee Leitz, MD

## 2020-10-27 NOTE — Progress Notes (Signed)
    SUBJECTIVE:   CHIEF COMPLAINT / HPI: flu like symptoms  Primary symptom: body aches Duration: Jan 4 Severity: moderate Associated symptoms:aches,nausea,dizziness,headaches Fever? Tmax?: reports fever and chills.  Did not take temperature Sick contacts: None Covid test:none Covid vaccination(s): yes x 2  PERTINENT  PMH / PSH:  ADHD Chronic Cough Chronic Sinusitis  OBJECTIVE:   There were no vitals taken for this visit.   BP 117/80 HR 80 O2 sat 98% Temp 98,2  General: Alert, no acute distress HEENT: TMs visible,no bulging or erythema, no frontal or maxillary sinus tenderness.Pharyngeal wall normal.  No Lymphadenopathy Cardio: Normal S1 and S2, RRR, no r/m/g Pulm: CTAB, normal work of breathing, no wheezing or cracklesappreciated   ASSESSMENT/PLAN:   Viral URI COVID testing today Symptom management CDC guidelines provided for self isolation Strict return precautions provided Follow up with PCP as needed     ADHD (attention deficit hyperactivity disorder) Refill Ritalin 10 mg daily x30/7 Will need to see PCP before next refill. Patient is aware of this     Dana Allan, MD [facility]

## 2020-10-28 ENCOUNTER — Other Ambulatory Visit: Payer: Self-pay

## 2020-10-28 ENCOUNTER — Ambulatory Visit (INDEPENDENT_AMBULATORY_CARE_PROVIDER_SITE_OTHER): Payer: Medicaid Other | Admitting: Family Medicine

## 2020-10-28 DIAGNOSIS — Z1152 Encounter for screening for COVID-19: Secondary | ICD-10-CM | POA: Diagnosis present

## 2020-10-28 DIAGNOSIS — F902 Attention-deficit hyperactivity disorder, combined type: Secondary | ICD-10-CM

## 2020-10-28 DIAGNOSIS — J069 Acute upper respiratory infection, unspecified: Secondary | ICD-10-CM

## 2020-10-28 MED ORDER — METHYLPHENIDATE HCL 10 MG PO TABS: 10.0000 mg | ORAL_TABLET | Freq: Every morning | ORAL | 0 refills | Status: DC

## 2020-10-30 ENCOUNTER — Encounter: Payer: Self-pay | Admitting: Family Medicine

## 2020-10-30 LAB — NOVEL CORONAVIRUS, NAA: SARS-CoV-2, NAA: NOT DETECTED

## 2020-10-30 LAB — SARS-COV-2, NAA 2 DAY TAT

## 2020-10-31 ENCOUNTER — Encounter: Payer: Self-pay | Admitting: Family Medicine

## 2020-10-31 DIAGNOSIS — J069 Acute upper respiratory infection, unspecified: Secondary | ICD-10-CM | POA: Insufficient documentation

## 2020-10-31 NOTE — Assessment & Plan Note (Signed)
Refill Ritalin 10 mg daily x30/7 Will need to see PCP before next refill. Patient is aware of this

## 2020-10-31 NOTE — Assessment & Plan Note (Signed)
COVID testing today Symptom management CDC guidelines provided for self isolation Strict return precautions provided Follow up with PCP as needed

## 2021-03-31 ENCOUNTER — Encounter: Payer: Self-pay | Admitting: Family Medicine

## 2021-03-31 ENCOUNTER — Ambulatory Visit (INDEPENDENT_AMBULATORY_CARE_PROVIDER_SITE_OTHER): Payer: Medicaid Other | Admitting: Family Medicine

## 2021-03-31 ENCOUNTER — Other Ambulatory Visit: Payer: Self-pay

## 2021-03-31 DIAGNOSIS — X503XXA Overexertion from repetitive movements, initial encounter: Secondary | ICD-10-CM

## 2021-03-31 DIAGNOSIS — F902 Attention-deficit hyperactivity disorder, combined type: Secondary | ICD-10-CM

## 2021-03-31 MED ORDER — METHYLPHENIDATE HCL 10 MG PO TABS: 10.0000 mg | ORAL_TABLET | Freq: Every morning | ORAL | 0 refills | Status: DC

## 2021-03-31 NOTE — Progress Notes (Signed)
     SUBJECTIVE:   CHIEF COMPLAINT / HPI:   Derrick Carey is a 30 y.o. male presents for meds refill   ADHD Would like his Ritalin refilled. He usually takes 10mg  once daily but has been off it for several weeks due to his insurance.    Wrist pain Started 1 month ago and symptoms present bilaterally. Pt plays videos games 12 hrs a day. His aunt gave him wrist splints which have been helping. Taking advil helps the pain. Denies numbness or paraesthesia. Pt is right handed. Denies falls or injuries to the wrist joint.    Flowsheet Row Office Visit from 03/31/2021 in [facility]  PHQ-9 Total Score 4        PERTINENT  PMH / PSH: ADHD  OBJECTIVE:   BP 124/74   Pulse 70   Ht 5\' 10"  (1.778 m)   Wt 267 lb (121.1 kg)   SpO2 96%   BMI 38.31 kg/m    General: Alert, no acute distress Cardio: well perfused  Neuro: Cranial nerves grossly intact    Wrist: Inspection: No obvious deformity. No swelling, erythema or bruising Palpation: TTP on palpation of wrist joint especially over mid volar and dorsum of the wrist over carpal bones ROM: Full ROM of the digits and wrist. Fully able to extend and flex finger. Strength: 5/5 strength in the forearm, wrist and interosseus muscles Neurovascular: NV intact Special tests: positive tinel's at the carpal tunnel bilaterally-eliciting pain, positive Phalen's at right wrist-eliciting pain, no paraesthesia   Fingers:  No swelling in PIP, DIP joints. Flexor digitorum profundus and superficialis tendon functions are intact.  PIP joint collateral ligaments are stable     ASSESSMENT/PLAN:   Repetitive strain injury Most likely repetitive strain injury VS carpal tunnel syndrome 2/2 overuse. Low suspicion for fracture as no hx of injuries. Recommended: rest, ice, compression with splint and tylenol, motrin PRN. Follow up in 2 weeks if no improvement in sx. Could consider referral to [facility].  ADHD (attention deficit  hyperactivity disorder) PDMP reviewed. Refilled Ritalin 10mg  for 30 days.      , MD PGY-2 [facility]

## 2021-03-31 NOTE — Patient Instructions (Signed)
Thank you for coming to see me today. It was a pleasure. Today we discussed your wrist pain. It could be carpal tunnel syndrome caused by repetitive movement at the wrist by video games. I recommend  -Ice -tylenol, advil as needed -splint -Reduce playing video games  Please follow-up in a few weeks if no improvement. I can refer you to sports medicine.   If you have any questions or concerns, please do not hesitate to call the office at [phone].  Best wishes,   Dr Allena Katz

## 2021-04-01 DIAGNOSIS — X503XXA Overexertion from repetitive movements, initial encounter: Secondary | ICD-10-CM | POA: Insufficient documentation

## 2021-04-01 NOTE — Assessment & Plan Note (Signed)
PDMP reviewed. Refilled Ritalin 10mg  for 30 days.

## 2021-04-01 NOTE — Assessment & Plan Note (Signed)
Most likely repetitive strain injury VS carpal tunnel syndrome 2/2 overuse. Low suspicion for fracture as no hx of injuries. Recommended: rest, ice, compression with splint and tylenol, motrin PRN. Follow up in 2 weeks if no improvement in sx. Could consider referral to [facility].

## 2021-04-21 ENCOUNTER — Encounter: Payer: Self-pay | Admitting: Family Medicine

## 2021-04-27 ENCOUNTER — Other Ambulatory Visit: Payer: Self-pay

## 2021-04-27 ENCOUNTER — Ambulatory Visit (INDEPENDENT_AMBULATORY_CARE_PROVIDER_SITE_OTHER): Payer: Medicaid Other | Admitting: Family Medicine

## 2021-04-27 DIAGNOSIS — G5600 Carpal tunnel syndrome, unspecified upper limb: Secondary | ICD-10-CM

## 2021-04-27 DIAGNOSIS — X503XXA Overexertion from repetitive movements, initial encounter: Secondary | ICD-10-CM

## 2021-04-27 NOTE — Patient Instructions (Signed)
It was great seeing you today! Your wrist is likely due to repetitive strain from playing video games. You also have symptoms of carpal tunnel syndrome.   As discusssed, stretch your wrist on the wall whenever you take your braces off. Wear your braces 24 hours a day. Take    [facility] to [facility] up your braces. Take 600 mg ibuproen/advil three times a day for the next week. Follow up in 4 weeks if not improving.   [facility]  Address: [address] [place], [place], [place] [address] Closes 4:30PM Phone: [phone]    Take care,   [facility]

## 2021-04-27 NOTE — Progress Notes (Signed)
   SUBJECTIVE:   CHIEF COMPLAINT / HPI:   Chief Complaint  Patient presents with   Wrist Pain     Derrick Carey is a 30 y.o. male here for follow up for wrist pain. Pain started over a month ago.  Pt reports bilateral wrist pain. He is dropping things. Plays video games >12hr/day but cut back to an hour daily. Wrist splints sometimes and others he wears compression. Takes Advil 400-800 mg are helping. No falls, trauma or injuries. R bbneports pain works in the right than left. Pain is throbbing, twitching. Pain rated 6 /10 at worse and 1/10 currently. No sleep disturbance. Woke up with pain in the morning.    PERTINENT  PMH / PSH: reviewed and updated as appropriate   OBJECTIVE:   BP 102/70   Pulse 69   Ht 5\' 10"  (1.778 m)   Wt 265 lb 9.6 oz (120.5 kg)   SpO2 97%   BMI 38.11 kg/m    GEN: well appearing male in no acute distress  CVS: well perfused  RESP: speaking in full sentences without pause, no respiratory distress  MSK:  Wrist, bilateral: TTP noted at the flexor retinaculum. Inspection yielded no erythema, ecchymosis, bony deformity, or swelling. ROM full with good flexion and extension and ulnar/radial deviation that is symmetrical with opposite wrist. Palpation is normal over metacarpals, scaphoid and lunate without tenderness/swelling. Strength 4+/5 in all directions without pain. Negative Finkelstein, +Tinel's and +Phalens.    ASSESSMENT/PLAN:   Repetitive strain injury Repetitive strain injury VS carpal tunnel syndrome 2/2 overuse. Doubt fracture. Pt continues to play video games but reports reduced level. Advised to avoid video games until he no longer has wrist pain. Discussed future flares. Performed Myofascial release technique. Pt tolerated well and found some relief. Reviewed home exercises for relief. Pt to pick up bilateral thumb spica wrist splints to be worn nearly 24hr for the next week. Take 600-800 mg ibuprofen TID for pain. Follow up in 4 weeks if not  improving. Consider sports medicine referral at that time.      , DO PGY-2, [facility] 04/27/2021

## 2021-04-28 NOTE — Assessment & Plan Note (Signed)
Repetitive strain injury VS carpal tunnel syndrome 2/2 overuse. Doubt fracture. Pt continues to play video games but reports reduced level. Advised to avoid video games until he no longer has wrist pain. Discussed future flares. Performed Myofascial release technique. Pt tolerated well and found some relief. Reviewed home exercises for relief. Pt to pick up bilateral thumb spica wrist splints to be worn nearly 24hr for the next week. Take 600-800 mg ibuprofen TID for pain. Follow up in 4 weeks if not improving. Consider sports medicine referral at that time.

## 2021-06-19 ENCOUNTER — Ambulatory Visit: Payer: No Typology Code available for payment source | Admitting: Student

## 2021-06-27 ENCOUNTER — Other Ambulatory Visit: Payer: Self-pay | Admitting: Family Medicine

## 2021-06-27 DIAGNOSIS — F902 Attention-deficit hyperactivity disorder, combined type: Secondary | ICD-10-CM

## 2021-06-29 MED ORDER — METHYLPHENIDATE HCL 10 MG PO TABS: 10.0000 mg | ORAL_TABLET | Freq: Every morning | ORAL | 0 refills | Status: DC

## 2021-07-01 ENCOUNTER — Ambulatory Visit: Payer: No Typology Code available for payment source | Admitting: Family Medicine

## 2021-07-03 ENCOUNTER — Ambulatory Visit (INDEPENDENT_AMBULATORY_CARE_PROVIDER_SITE_OTHER): Payer: Medicaid Other | Admitting: Family Medicine

## 2021-07-03 ENCOUNTER — Other Ambulatory Visit: Payer: Self-pay

## 2021-07-03 VITALS — BP 115/66 | HR 58 | Ht 71.0 in | Wt 263.8 lb

## 2021-07-03 DIAGNOSIS — F902 Attention-deficit hyperactivity disorder, combined type: Secondary | ICD-10-CM

## 2021-07-03 DIAGNOSIS — X503XXA Overexertion from repetitive movements, initial encounter: Secondary | ICD-10-CM

## 2021-07-03 MED ORDER — METHYLPHENIDATE HCL 10 MG PO TABS: 10.0000 mg | ORAL_TABLET | Freq: Every morning | ORAL | 0 refills | Status: DC

## 2021-07-03 MED ORDER — DICLOFENAC SODIUM 1 % EX GEL: 2.0000 g | Freq: Four times a day (QID) | CUTANEOUS | 1 refills | Status: DC

## 2021-07-03 NOTE — Progress Notes (Signed)
    SUBJECTIVE:   CHIEF COMPLAINT / HPI:   Wrist Pain Bilateral, ongoing for several months Seen for the same in July. Thought to be repetitive strain injury vs carpal tunnel 2/2 overuse.  Prior recommendations: Thumb spica splints, ibuprofen, cessation from video games  Today he reports his wrist pain has gotten better, but not resolved. R is worse than L. Still finds himself dropping things occasionally. He is right hand dominant. Wearing the thumb spica splints "not as often as I should to be honest". Wears them for ~3hrs every couple days. Took Ibuprofen for 2 weeks, then stopped because of GI symptoms. Continues to play video games >8hrs per day Denies numbness, tingling, or burning sensation.  ADHD, Med Refill Requests refills on his Ritalin. Has been stable on 10mg  daily for many many years.  He is not currently in school or working. 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  PERTINENT  PMH / PSH: ADHD, allergic rhinitis  OBJECTIVE:   BP 115/66   Pulse (!) 58   Ht 5\' 11"  (1.803 m)   Wt 263 lb 12.8 oz (119.7 kg)   SpO2 99%   BMI 36.79 kg/m   General: NAD, pleasant, able to participate in exam Respiratory: No respiratory distress Skin: warm and dry, no rashes noted Psych: Normal affect and mood Neuro: grossly intact MSK: No deformity or swelling of bilateral wrists. No skin changes noted. Full ROM, 5/5 strength bilaterally. Neurovascularly intact distally. Minimal tenderness to palpation of mid volar wrist. Negative Phalen's sign.  ASSESSMENT/PLAN:   Repetitive strain injury Patient with persistent bilateral wrist pain for several months. Symptoms again consistent with repetitive strain injury 2/2 video game use >8hrs/day. Felt less likely to be carpal tunnel based on today's exam. -Once again counseled on reducing or eliminating video game use. Patient is not interested in doing so at this time. -Rx sent for Voltaren gel -Continue  thumb spica wrist splints -Supportive care with Tylenol, ice, etc. -Discussed sports med referral. He prefers to try Voltaren gel first.   ADHD (attention deficit hyperactivity disorder) PDMP reviewed. Appropriate. Refilled Ritalin for 30 days.     , MD [facility]

## 2021-07-03 NOTE — Patient Instructions (Addendum)
It was great to meet you!  For your wrist pain: -The likely long term solution would be to drastically cut back or eliminate your video game playing/computer use -We will try a topical medication called Voltaren (diclofenac) gel to help manage your symptoms -It is important to wear the wrist brace as much as possible -Continue taking Tylenol when it's particularly bothersome -You can also apply ice as needed  For your sinus pressure/ear pain: -Your exam was normal today -You can try using saline nasal spray daily  I have sent refills on your Ritalin. 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  Take care and seek immediate care sooner if you develop any concerns.  Dr. [facility]

## 2021-07-04 NOTE — Assessment & Plan Note (Signed)
Patient with persistent bilateral wrist pain for several months. Symptoms again consistent with repetitive strain injury 2/2 video game use >8hrs/day. Felt less likely to be carpal tunnel based on today's exam. -Once again counseled on reducing or eliminating video game use. Patient is not interested in doing so at this time. -Rx sent for Voltaren gel -Continue thumb spica wrist splints -Supportive care with Tylenol, ice, etc. -Discussed sports med referral. He prefers to try Voltaren gel first.

## 2021-07-04 NOTE — Assessment & Plan Note (Signed)
PDMP reviewed. Appropriate. Refilled Ritalin for 30 days.

## 2021-07-24 ENCOUNTER — Encounter: Payer: Self-pay | Admitting: Family Medicine

## 2021-08-10 ENCOUNTER — Telehealth: Payer: Self-pay

## 2021-08-10 DIAGNOSIS — X503XXA Overexertion from repetitive movements, initial encounter: Secondary | ICD-10-CM

## 2021-08-10 NOTE — Telephone Encounter (Signed)
Referral to sports medicine placed

## 2021-08-10 NOTE — Telephone Encounter (Signed)
Patient calls nurse line requesting to proceed with sports medicine referral.   Patient was seen in clinic on 07/03/2021. Patient was trying voltaren gel and supportive care, however, would now like to get referral to specialist.   Please advise.   Veronda Prude, RN

## 2021-08-17 ENCOUNTER — Encounter: Payer: Self-pay | Admitting: Family Medicine

## 2021-08-17 ENCOUNTER — Other Ambulatory Visit: Payer: Self-pay

## 2021-08-17 ENCOUNTER — Telehealth (INDEPENDENT_AMBULATORY_CARE_PROVIDER_SITE_OTHER): Payer: Medicaid Other | Admitting: Family Medicine

## 2021-08-17 DIAGNOSIS — J069 Acute upper respiratory infection, unspecified: Secondary | ICD-10-CM | POA: Diagnosis not present

## 2021-08-17 MED ORDER — BENZONATATE 100 MG PO CAPS: 100.0000 mg | ORAL_CAPSULE | Freq: Two times a day (BID) | ORAL | 0 refills | Status: DC | PRN

## 2021-08-17 NOTE — Assessment & Plan Note (Signed)
Likely viral upper respiratory illness considering influenza or COVID. Patient had negative home covid test but has concern for user error. Counseled to use honey and tessalon perles to help with cough as well as sleeping isolated in a room with humidifier. Patient to continue with hydration orally.  Will send letter via MyChart and recommended patient contact jury duty office prior to assigned date

## 2021-08-17 NOTE — Progress Notes (Signed)
[facility] Telemedicine Visit  Patient consented to have virtual visit and was identified by name and date of birth. Method of visit: Telephone  Encounter participants: Patient: Derrick Carey - located at home address  Provider: Ronnald Ramp - located at [facility]  Chief Complaint: COVID symptoms   HPI: Patient reports that his chest & body feels achy Reports symptoms one week ago  Derrick Carey to a funeral the week before last  Patient reports fevers, sweats and chills  Fever max was 102  + cough, dry, especially  Has felt SOB for brief episode  Reports some nausea  No diarrhea Appetite is good, ok to drink fluids  intermittently HA  +body aches  Takes 2 advils every few hours and delsym  Patient did a home test that was negative two days ago, reports results were negative  Had the first two doses of covid vaccine, no boosters   Patient requesting letter as he is assigned for jury duty on 08/20/21.     ROS: per HPI  Pertinent PMHx:  ADHD  Exam:  Unable to obtain vital signs for this visit.  Respiratory: speaking in clear sentences, no signs of respiratory distress, patient is stable on RA, frequently has coughing fits during interview  Gen: male appearing ill but non toxic, sitting upright on video, does not appear pale   Assessment/Plan:  Viral upper respiratory illness Likely viral upper respiratory illness considering influenza or COVID. Patient had negative home covid test but has concern for user error. Counseled to use honey and tessalon perles to help with cough as well as sleeping isolated in a room with humidifier. Patient to continue with hydration orally.  Will send letter via MyChart and recommended patient contact jury duty office prior to assigned date     Time spent during visit with patient: 13 minutes

## 2021-09-21 ENCOUNTER — Ambulatory Visit: Payer: Medicaid Other | Admitting: Family Medicine

## 2021-09-29 ENCOUNTER — Encounter: Payer: Self-pay | Admitting: Family Medicine

## 2021-09-30 ENCOUNTER — Ambulatory Visit: Payer: Medicaid Other | Admitting: Family Medicine

## 2021-10-04 ENCOUNTER — Telehealth: Payer: Self-pay | Admitting: Family Medicine

## 2021-10-04 NOTE — Telephone Encounter (Signed)
After-hours call Patient called the after-hours line.  He reports that he has been having worsening congestion and is concerned that he may have a sinus infection.  Denies any shortness of breath.  Reports that he started having the symptoms on December 11 and tested positive for COVID on 12/13.  He wants to be evaluated to see if he has a sinus infection and needs any antibiotics for this.  Informed patient that this is the emergency call on to determine if someone needs to go to the hospital or not but also went ahead and scheduled for appointment and access to care clinic for evaluation tomorrow 12/19 at 3:10 PM.  No further questions or concerns.

## 2021-10-05 ENCOUNTER — Ambulatory Visit (INDEPENDENT_AMBULATORY_CARE_PROVIDER_SITE_OTHER): Payer: Medicaid Other | Admitting: Family Medicine

## 2021-10-05 ENCOUNTER — Other Ambulatory Visit: Payer: Self-pay

## 2021-10-05 VITALS — BP 118/68 | HR 79 | Ht 71.0 in | Wt 266.8 lb

## 2021-10-05 DIAGNOSIS — B349 Viral infection, unspecified: Secondary | ICD-10-CM | POA: Diagnosis not present

## 2021-10-05 DIAGNOSIS — J069 Acute upper respiratory infection, unspecified: Secondary | ICD-10-CM | POA: Diagnosis not present

## 2021-10-05 DIAGNOSIS — R052 Subacute cough: Secondary | ICD-10-CM

## 2021-10-05 DIAGNOSIS — H672 Otitis media in diseases classified elsewhere, left ear: Secondary | ICD-10-CM

## 2021-10-05 MED ORDER — BENZONATATE 100 MG PO CAPS: 100.0000 mg | ORAL_CAPSULE | Freq: Two times a day (BID) | ORAL | 0 refills | Status: DC | PRN

## 2021-10-05 NOTE — Assessment & Plan Note (Signed)
New.  Appears viral in nature, most likely due to concurrent COVID infection.  Discussed decongestion.  Recommend daily and histamine such as Claritin, Zyrtec, or Benadryl.  See AVS for more.

## 2021-10-05 NOTE — Progress Notes (Signed)
° ° °  SUBJECTIVE:   CHIEF COMPLAINT / HPI:   Continued cough, congestion, left ear discomfort - COVID dx 12/13 - hearing normal - really dizzy "all the time" - has gotten post-tussive nausea but no vomiting - a few intermittent subjective fevers - no improvement since COVID dx on 12/13, no improvement  PERTINENT  PMH / PSH:  Patient Active Problem List   Diagnosis Date Noted   Viral ear infection, left 10/05/2021   Repetitive strain injury 04/01/2021   Viral upper respiratory illness 10/31/2020   Poor dentition 12/02/2015   Tobacco use disorder 07/23/2015   Obesity 07/23/2015   Allergic rhinitis 06/05/2015   ACNE 02/17/2007   ADHD (attention deficit hyperactivity disorder) 12/15/2006     OBJECTIVE:   BP 118/68    Pulse 79    Ht 5\' 11"  (1.803 m)    Wt 266 lb 12.8 oz (121 kg)    SpO2 99%    BMI 37.21 kg/m    PHQ-9:  Depression screen [facility] 2/9 10/05/2021 08/17/2021 07/03/2021  Decreased Interest 1 1 0  Down, Depressed, Hopeless 0 0 1  PHQ - 2 Score 1 1 1   Altered sleeping 0 1 1  Tired, decreased energy 1 3 0  Change in appetite 0 0 0  Feeling bad or failure about yourself  0 0 1  Trouble concentrating 0 0 1  Moving slowly or fidgety/restless 0 0 1  Suicidal thoughts 0 0 0  PHQ-9 Score 2 5 5   Difficult doing work/chores - - -  Some recent data might be hidden     GAD-7: No flowsheet data found.  Physical Exam General: Awake, alert, oriented HEENT: Right TM pearly pink and flat, left TM minimally erythematous and bulging, bilateral external auditory canals with minimal cerumen burden, oral mucosa pink, moist, without lesion, intact dentition without obvious cavity Lymph: No palpable lymphedema of head or neck Cardiovascular: Regular rate and rhythm, S1 and S2 present, no murmurs auscultated Respiratory: Lung fields clear to auscultation bilaterally with good air movement in all fields  ASSESSMENT/PLAN:   Viral upper respiratory illness Subacute, stable.  Chronic  cough without improvement.  Diagnosed with COVID 12/13.  Discussed expectations for slow cough improvement after COVID.  Conservative over-the-counter measures discussed.  Rx for .  See AVS for full.  Viral ear infection, left New.  Appears viral in nature, most likely due to concurrent COVID infection.  Discussed decongestion.  Recommend daily and histamine such as Claritin, Zyrtec, or Benadryl.  See AVS for more.     , MD [facility]

## 2021-10-05 NOTE — Patient Instructions (Addendum)
It was wonderful to meet you today. Thank you for allowing me to be a part of your care. Below is a short summary of what we discussed at your visit today:  Cough You do not have evidence of a developing pneumonia. Your lungs sound clear! Treat this cough with over the counter symptomatic treatments as though you have the common cold.  - Tessalon Perles - Pineapple juice - Honey - Cough drops or cough syrup - It is okay to use cough suppressants in this case  Ear infection -Your left ear looks will has a viral ear infection - Likely secondary to your COVID infection - Does not appear to be bacterial in nature so does not need antibiotics - I would use some sort of antihistamine like a daily Zyrtec, Claritin, or Benadryl to help reduce sinus drainage and help your eustachian tube to drain  Health Maintenance We like to think about ways to keep you healthy for years to come. Below are some interventions and screenings we can offer to keep you healthy: - COVID bivalent booster - Flu vaccine - TDaP vaccine **All of the above may be done once you have recovered and feel much better.  Simply call for nurse only vaccine appointment.**  Please bring all of your medications to every appointment!  If you have any questions or concerns, please do not hesitate to contact us via phone or MyChart message.   Fayette Pho, MD

## 2021-10-05 NOTE — Assessment & Plan Note (Signed)
Subacute, stable.  Chronic cough without improvement.  Diagnosed with COVID 12/13.  Discussed expectations for slow cough improvement after COVID.  Conservative over-the-counter measures discussed.  Rx for [employer].  See AVS for full.

## 2021-10-21 ENCOUNTER — Ambulatory Visit: Payer: Medicaid Other | Admitting: Family Medicine

## 2021-10-28 ENCOUNTER — Ambulatory Visit: Payer: Medicaid Other | Admitting: Family Medicine

## 2022-02-05 ENCOUNTER — Ambulatory Visit (INDEPENDENT_AMBULATORY_CARE_PROVIDER_SITE_OTHER): Payer: Medicaid Other | Admitting: Family Medicine

## 2022-02-05 ENCOUNTER — Other Ambulatory Visit: Payer: Self-pay

## 2022-02-05 ENCOUNTER — Encounter: Payer: Self-pay | Admitting: Family Medicine

## 2022-02-05 ENCOUNTER — Other Ambulatory Visit (HOSPITAL_COMMUNITY): Payer: Self-pay

## 2022-02-05 ENCOUNTER — Ambulatory Visit: Payer: No Typology Code available for payment source | Admitting: Family Medicine

## 2022-02-05 VITALS — BP 132/75 | Ht 71.0 in | Wt 280.0 lb

## 2022-02-05 DIAGNOSIS — Z23 Encounter for immunization: Secondary | ICD-10-CM | POA: Diagnosis not present

## 2022-02-05 DIAGNOSIS — F902 Attention-deficit hyperactivity disorder, combined type: Secondary | ICD-10-CM | POA: Diagnosis present

## 2022-02-05 DIAGNOSIS — Z0000 Encounter for general adult medical examination without abnormal findings: Secondary | ICD-10-CM | POA: Diagnosis not present

## 2022-02-05 DIAGNOSIS — M542 Cervicalgia: Secondary | ICD-10-CM

## 2022-02-05 DIAGNOSIS — M25531 Pain in right wrist: Secondary | ICD-10-CM

## 2022-02-05 DIAGNOSIS — M25532 Pain in left wrist: Secondary | ICD-10-CM | POA: Diagnosis not present

## 2022-02-05 DIAGNOSIS — M79602 Pain in left arm: Secondary | ICD-10-CM | POA: Insufficient documentation

## 2022-02-05 MED ORDER — METHYLPHENIDATE HCL 10 MG PO TABS
10.0000 mg | ORAL_TABLET | Freq: Every morning | ORAL | 0 refills | Status: DC
  Filled 2022-02-05: qty 30, 30d supply, fill #0

## 2022-02-05 NOTE — Progress Notes (Signed)
[facility]: Attending Note: I have reviewed the chart, discussed wit the Sports Medicine Fellow. I agree with assessment and treatment plan as detailed in the Fellow's note.  

## 2022-02-05 NOTE — Assessment & Plan Note (Signed)
Most likely muscular.  Exam is reassuring.  No need for imaging at this time.  Provided reassurance.  Can use heat.  Also given HEP for cervical ROM.  Can use oral OTC NSAIDs prn.  F/U 6-8 weeks.  If not improving, may consider gabapentin. ?

## 2022-02-05 NOTE — Progress Notes (Signed)
? ?  Derrick Carey is a 31 y.o. male who presents to [facility] today for the following: ? ?Chronic Neck Pain ?Occurring for many years ?No severe injury but reports "multiple head injuries over the years" with the last 5 years ago ?No N/T in BUE ?Pain mostly in bilateral neck/upper back ?Feels tight and sore ?Maybe has a "weird sensation" in his arms that he cannot describe ?Has been off and on, but more frequent recently ? ?Chronic R>L Wrist Pain ?Also off and on for many years ?Gradual onset, no injury ?Hurts to pick up heavy things ?Sometimes hurts with typing ?No pain at rest ?PCP gave wrist braces which helped at first, but hurt some ?Has rarely dropped things due to weakness or pain ?Has tried Advil, that provides about 1 hr of improvement ?Doesn't do any lifting for exercise ?RHD ? ?PMH reviewed.  ?ROS as above. ?Medications reviewed. ? ?Exam:  ?BP 132/75   Ht 5\' 11"  (1.803 m)   Wt 280 lb (127 kg)   BMI 39.05 kg/m?  ?Gen: Well NAD ?MSK: ? ?Neck/Back: ?- Inspection: Anterior head carriage.  No gross deformity or asymmetry, swelling or ecchymosis ?- Palpation: No TTP spinous process, Has increased hypertonicity throughout b/l upper traps with TTP ?- ROM: full active ROM of the cervical spine with neck extension, rotation, flexion without pain ?- Strength: 5/5 wrist flexion, extension, biceps flexion, triceps extension. OK sign, interosseus strength intact  ?- Neuro: sensation intact in the C5-C8 nerve root distribution b/l, 2+ C5-C7 reflexes ?- Special testing:  negative spurling's ? ?Bilateral Wrists: ?Inspection: No obvious deformity b/l. No swelling, erythema or bruising b/l ?Palpation: no TTP b/l ?ROM: Full ROM of the digits and wrist b/l.  ?Strength: 5/5 strength in the forearm, wrist and interosseus muscles b/l, slight pain with resisted wrist extension and flexion ?Neurovascular: NV intact b/l ?Special tests: Negative finkelstein's, negative tinel's at the carpal tunnel, negative Phalen's  ? ? ?No results  found. ? ? ?Assessment and Plan: ?1) Neck pain ?Most likely muscular.  Exam is reassuring.  No need for imaging at this time.  Provided reassurance.  Can use heat.  Also given HEP for cervical ROM.  Can use oral OTC NSAIDs prn.  F/U 6-8 weeks.  If not improving, may consider gabapentin. ? ?Bilateral wrist pain ?Very nondescript pain and no findings on exam to suggest a concerning cause.  Very less likely to be related to his neck pain.  Most likely 2/2 overuse and could also consider some early OA.  Will start with wrist HEP, continue current OTC NSAIDs prn, and f/u in 6-8 weeks.  Can continue to use braces if he finds these helpful. ? ? ? , D.O.  ?PGY-4 [facility]  ?02/05/2022 11:23 AM ?

## 2022-02-05 NOTE — Patient Instructions (Signed)
Thank you for coming to see me today. It was a pleasure. Refilled ADHD medications for you. ?Please appoitnemnt for covid booster shot. ? ?If you have any questions or concerns, please do not hesitate to call the office at [phone]. ? ?Best wishes,  ? ?Dr Posey Pronto   ?

## 2022-02-05 NOTE — Assessment & Plan Note (Addendum)
Very nondescript pain and no findings on exam to suggest a concerning cause.  Very less likely to be related to his neck pain.  Most likely 2/2 overuse and could also consider some early OA.  Will start with wrist HEP, continue current OTC NSAIDs prn, and f/u in 6-8 weeks.  Can continue to use braces if he finds these helpful. ?

## 2022-02-05 NOTE — Patient Instructions (Signed)
Thank you for coming to see me today. It was a pleasure. Today we talked about:  ? ?You have some muscular strain or signs of early arthritis.  You can continue to take ibuprofen or Advil as needed for your pain.  We will give you some exercises that you should aim to do at least 5 times a week. ? ?Please follow-up with [place] in 6-8 weeks. ? ?If you have any questions or concerns, please do not hesitate to call the office at [phone]. ? ?Best,  ? ?Luis Abed, DO ?[facility]  ?

## 2022-02-07 DIAGNOSIS — Z0000 Encounter for general adult medical examination without abnormal findings: Secondary | ICD-10-CM | POA: Insufficient documentation

## 2022-02-07 NOTE — Assessment & Plan Note (Signed)
Pt received tetanus shot today. Declined covid booster.  ?

## 2022-02-07 NOTE — Progress Notes (Signed)
? ? ? ?  SUBJECTIVE:  ? ?CHIEF COMPLAINT / HPI:  ? ?Derrick Carey is a 31 y.o. male presents for follow up ? ?Pt presents for ADHD medication refill. No other concerns today ? ?Flowsheet Row Office Visit from 02/05/2022 in [facility]  ?PHQ-9 Total Score 0  ? ?  ?  ?Health Maintenance Due  ?Topic  ? COVID-19 Vaccine (3 - Booster for [employer] series)  ? ? PERTINENT  PMH / PSH: ADHD ? ?OBJECTIVE:  ? ?BP 125/79   Pulse 77   Wt 271 lb (122.9 kg)   SpO2 97%   BMI 37.80 kg/m?   ? ?General: Alert, no acute distress ?Cardio: well perfused  ?Pulm: normal work of breathing ?Abdomen: Bowel sounds normal. Abdomen soft and non-tender.  ?Extremities: No peripheral edema.  ?Neuro: Cranial nerves grossly intact  ? ?ASSESSMENT/PLAN:  ? ?ADHD (attention deficit hyperactivity disorder) ?PMDP reviewed. Refilled Ritalin.  ? ?Health maintenance examination ?Pt received tetanus shot today. Declined covid booster.  ?  ? ?Towanda Octave, MD PGY-3 ?[facility]   ?

## 2022-02-07 NOTE — Assessment & Plan Note (Signed)
PMDP reviewed. Refilled Ritalin.  ?

## 2022-03-23 ENCOUNTER — Encounter: Payer: Self-pay | Admitting: *Deleted

## 2022-03-26 ENCOUNTER — Ambulatory Visit (INDEPENDENT_AMBULATORY_CARE_PROVIDER_SITE_OTHER): Payer: Medicaid Other | Admitting: Family Medicine

## 2022-03-26 VITALS — BP 129/75 | Ht 71.0 in | Wt 280.0 lb

## 2022-03-26 DIAGNOSIS — M25532 Pain in left wrist: Secondary | ICD-10-CM

## 2022-03-26 DIAGNOSIS — M542 Cervicalgia: Secondary | ICD-10-CM | POA: Diagnosis not present

## 2022-03-26 DIAGNOSIS — M25531 Pain in right wrist: Secondary | ICD-10-CM

## 2022-03-26 NOTE — Patient Instructions (Signed)
I would see that your wrist pain has improved by about 50%.  Continue to do your neck stretches but as we discussed today, do them more gently.  Regarding your upper body strength and endurance, I would add the wall push-ups in the shoulder shrug with Thera-Band's which my nurse gave you today.  I do think getting back into more regular activity such as a gym membership is a great idea for almost anyone.  Just remember to start slowly and not have great expectations until you have been back doing it for some time.  Great to see you!

## 2022-03-27 ENCOUNTER — Encounter: Payer: Self-pay | Admitting: Family Medicine

## 2022-03-27 NOTE — Progress Notes (Signed)
  Derrick Carey - 31 y.o. male MRN [id number]  Date of birth: 03-02-1991    SUBJECTIVE:      Chief Complaint:/ HPI:  Follow-up neck pain and wrist pain 1.  Neck pain is better but if he spends a long time with the computer he still has issues the next day.  Feels like he actually made things little worse with some of the exercises so he is not too sure about continuing those. 2.  Wrist pain much better about 50 to 60%.  Exercises have really helped.    OBJECTIVE: BP 129/75   Ht 5\' 11"  (1.803 m)   Wt 280 lb (127 kg)   BMI 39.05 kg/m   Physical Exam:  Vital signs are reviewed. HEENT: Neck has full range of motion.  He has some stretching sensation with hyperextension and ear to shoulder movements.  The neck is supple.  No lymphadenopathy. WRIST: Full range of motion.  Negative Tinel.  Normal hand grip strength.  ASSESSMENT & PLAN:  See problem based charting & AVS for pt instructions. No problem-specific Assessment & Plan notes found for this encounter. #1.  Chronic neck pain.  I redemonstrated how to do his neck stretches, focusing on gentleness and holding the stretch.  We will also add rhomboid exercises and some shoulder strengthening as I think this is contributing to poor posture. 2.  Chronic wrist and hand pain seems significantly improved.  I will continue home exercises.  He can follow-up as needed.

## 2022-04-23 ENCOUNTER — Ambulatory Visit (INDEPENDENT_AMBULATORY_CARE_PROVIDER_SITE_OTHER): Payer: Medicaid Other | Admitting: Family Medicine

## 2022-04-23 ENCOUNTER — Encounter: Payer: Self-pay | Admitting: Family Medicine

## 2022-04-23 DIAGNOSIS — Z0000 Encounter for general adult medical examination without abnormal findings: Secondary | ICD-10-CM

## 2022-04-23 DIAGNOSIS — F902 Attention-deficit hyperactivity disorder, combined type: Secondary | ICD-10-CM

## 2022-04-23 DIAGNOSIS — Z5941 Food insecurity: Secondary | ICD-10-CM | POA: Diagnosis not present

## 2022-04-23 MED ORDER — METHYLPHENIDATE HCL 10 MG PO TABS: 10.0000 mg | ORAL_TABLET | Freq: Every morning | ORAL | 0 refills | Status: DC

## 2022-04-23 NOTE — Assessment & Plan Note (Signed)
Completed paperwork for food stamps requested by [employer], social human services. Pt has got enough food stamps to last him. Offered pt CCM referral given food insecurity but pt declined and already has resources.

## 2022-04-23 NOTE — Assessment & Plan Note (Signed)
PDMP reviewed. Refilled Ritalin.

## 2022-04-23 NOTE — Progress Notes (Signed)
     SUBJECTIVE:   CHIEF COMPLAINT / HPI:   Derrick Carey is a 31 y.o. male presents for meds refill  ADHD Presents for meds refill. Takes Ritalin 20m daily.   Food insecurity Request paperwork to be completed for food stamps. He has had food stamps since the start of the pandemic. Pt is currently unemployed. He lives with his parents who are also unemployed.   Flowsheet Row Office Visit from 04/23/2022 in [facility]  PHQ-9 Total Score 5       Health Maintenance Due  Topic   COVID-19 Vaccine (3 - Pfizer series)     PERTINENT  PMH / PSH: ADHD, obesity   OBJECTIVE:   BP 112/70   Pulse 73   Wt 270 lb (122.5 kg)   SpO2 96%   BMI 37.66 kg/m    General: Alert, no acute distress Cardio: well perfused  Pulm: normal work of breathingema.  Neuro: Cranial nerves grossly intact   ASSESSMENT/PLAN:   ADHD (attention deficit hyperactivity disorder) PDMP reviewed. Refilled Ritalin.   Food insecurity Completed paperwork for food stamps requested by [employer], social human services. Pt has got enough food stamps to last him. Offered pt CCM referral given food insecurity but pt declined and already has resources.   Health maintenance examination Recommended covid booster at pharmacy given we do not have any at [facility] right now.   Towanda Octave, MD PGY-3 [facility]

## 2022-04-23 NOTE — Patient Instructions (Signed)
Thank you for coming to see me today. It was a pleasure. Sent ritalin refill to the pharmacy  If you have any questions or concerns, please do not hesitate to call the office at [phone].  Best wishes,   Dr Allena Katz

## 2022-04-23 NOTE — Assessment & Plan Note (Signed)
Recommended covid booster at pharmacy given we do not have any at [facility] right now.

## 2022-05-18 ENCOUNTER — Ambulatory Visit: Payer: Medicaid Other

## 2022-05-21 ENCOUNTER — Ambulatory Visit (INDEPENDENT_AMBULATORY_CARE_PROVIDER_SITE_OTHER): Payer: Medicaid Other | Admitting: Family Medicine

## 2022-05-21 VITALS — BP 134/84 | Wt 270.0 lb

## 2022-05-21 DIAGNOSIS — M25531 Pain in right wrist: Secondary | ICD-10-CM | POA: Diagnosis not present

## 2022-05-21 DIAGNOSIS — M25532 Pain in left wrist: Secondary | ICD-10-CM

## 2022-05-21 DIAGNOSIS — M542 Cervicalgia: Secondary | ICD-10-CM

## 2022-05-21 DIAGNOSIS — X503XXA Overexertion from repetitive movements, initial encounter: Secondary | ICD-10-CM | POA: Diagnosis not present

## 2022-05-21 MED ORDER — GABAPENTIN 100 MG PO CAPS: ORAL_CAPSULE | ORAL | 3 refills | Status: DC

## 2022-05-21 NOTE — Assessment & Plan Note (Signed)
Continues despite reported adherence to stretches and strengthening exercises.  Not currently using wrist braces due to discomfort during wear.  Voltaren gel providing minimal relief.  We will trial gabapentin nightly with increase gradually from 100 mg to 600 mg nightly.  Refer to PT for further stretches and strengthening exercises, possibly with observation and immediate feedback for best form.

## 2022-05-21 NOTE — Assessment & Plan Note (Signed)
Continues despite conservative measures thus far.  Again physical exam and HPI reassuring, no need for imaging at this time.  We will trial gabapentin and physical therapy.

## 2022-05-21 NOTE — Progress Notes (Signed)
[facility] Attending Note: I have seen and examined this patient. I have discussed this patient with the resident and reviewed the assessment and plan as documented above. I agree with the resident's findings and plan.  

## 2022-05-21 NOTE — Progress Notes (Signed)
    SUBJECTIVE:   CHIEF COMPLAINT / HPI:   Pain follow-up of neck, shoulder, wrists  Neck: He has been doing the stretches as instructed with only mild relief.  He reports intermittent pain to previously established triggers.  No new red flags such as weakness, difficulty breathing, or shortness of breath.  Right shoulder: Reports his right shoulder is still quite "angry", and start to be more painful after about 10 minutes of activity.  He says that at work he lifts objects quite a bit and that the lifting and placing down exacerbates his shoulder and arm pain.  He does report his arm feels kind of weak sometimes and that the wrist and shoulder pain limits his use.  He is right-handed.  Bilateral wrists: He reports doing the wall push-ups, stretching, and other therapy as instructed with mild to moderate relief.  He has been using the Voltaren gel but does not see much of a difference.  Splints became painful after 10 to 15 minutes of wear so he stopped using these.  PERTINENT  PMH / PSH: Repetitive strain injury, neck pain, obesity, allergic rhinitis, GERD  OBJECTIVE:   BP 134/84   Wt 270 lb (122.5 kg)   BMI 37.66 kg/m    Physical Exam General: Awake, alert, oriented, no acute distress Respiratory: Unlabored respirations, speaking in full sentences, no respiratory distress Extremities: Moving all extremities spontaneously Neuro: Cranial nerves II through X grossly intact Psych: Normal insight and judgement  ASSESSMENT/PLAN:   Bilateral wrist pain Continues despite reported adherence to stretches and strengthening exercises.  Not currently using wrist braces due to discomfort during wear.  Voltaren gel providing minimal relief.  We will trial gabapentin nightly with increase gradually from 100 mg to 600 mg nightly.  Refer to PT for further stretches and strengthening exercises, possibly with observation and immediate feedback for best form.  Neck pain Continues despite  conservative measures thus far.  Again physical exam and HPI reassuring, no need for imaging at this time.  We will trial gabapentin and physical therapy.     Fayette Pho, MD [facility]

## 2022-05-21 NOTE — Patient Instructions (Addendum)
I have referred you to physical therapy.  This should be at the [facility] physical therapy clinic, the [facility] location. Someone from their office should be calling you in 1 to 2 weeks to schedule an appointment.  If you do not hear from them, let us know. We may need to nudge along the referral.    Start the gabapentin 100 mg nightly.  For 3 to 4 days, as long as you are tolerating this medication well, you may increase it by 100 mg.  This is until you reach 600 mg total nightly.  Please follow-up in 3 to 4 weeks to assess how this medication is working for you and to make sure you are connected to physical therapy.  Fayette Pho, MD Denny Levy, MD   Community Resources for everyone in [place]:  Food finder app Download the Greater [employer] App or Call 211 to easily find food banks and pantries and other resources nearby.   Employment / Job Secretary/administrator of [place]: [phone] / [address] Human resources officer (Client preference), Accepting New clients  [employer] ([employer]): [phone] ([place]) / [phone] ([place]) Virtual & Onsite workshops, Accepting new clients  [employer] laboratory technician Resource/ Career Center: [phone] / [phone] Virtual & Onsite , Accepting New clients  [facility] Job & Career Center: [phone]    DHHS Work First: [phone] ([place]) / [phone] ([place]) Virtual, Accepting clients  [employer] :  [phone] Virtual and Onsite, Accepting new clients    Financial Assistance [facility]:  ([phone] Virtual (financial assistance) & Onsite (all other services), Accepting new families  [employer]: [phone] Programmer, applications Network (furniture):  [phone]   [facility] Helping Hands: [phone]   Low Income Energy Assistance  [phone] Virtual, accepting new applicants   Food Assistance DHHS- SNAP/ Food Stamps: [phone] Virtual  WIC:  [place]([phone] ;  [place] [phone]   Little Green Book- Free Meals   Little Blue Book- Free Food Pantries   During the summer, text "FOOD" to [phone]    General Health / Clinics (Adults) Orange Card (for Adults) through [employer]: [phone]   [facility]:   [phone]   [facility]:   [phone]   Health Department:  [phone] Onsite, accepting new patients  [facility] [place]:   [phone] Onsite, accepting new clients  [facility]:   [phone] Onsite, accepting new clients    Housing [place] Housing Coalition:   (939)883-8880  [facility] Housing Authority:  863-115-4587  Affordable Housing Management:  905-228-3200  [facility] Ministry Pathways Shelter:  ([phone]  [facility] [phone]  [employer] / [facility] [place]:  [phone] / [phone]   [facility]:  [phone]  [employer] [phone] (primarily [place])   Transportation [place] Transportation: 301-516-7757 to apply  [place] [employer]: [phone] (reduced-fare bus ID to Medicaid/ Medicare/ Orange Card  [employer] Paratransit services: Eligible riders only, call [phone] for application   Childcare [place] Child Development: 575-585-7895 ([place]) / [phone] ([place])  - Child Care Resources/ Referrals/ Scholarships  - Head Start/ Early Head Start (call or apply online)  [place] DHHS:  Pre-K :  [phone] / [phone]

## 2022-05-24 ENCOUNTER — Ambulatory Visit (INDEPENDENT_AMBULATORY_CARE_PROVIDER_SITE_OTHER): Payer: Medicaid Other | Admitting: Family Medicine

## 2022-05-24 ENCOUNTER — Encounter: Payer: Self-pay | Admitting: Family Medicine

## 2022-05-24 ENCOUNTER — Telehealth: Payer: Self-pay | Admitting: *Deleted

## 2022-05-24 DIAGNOSIS — F902 Attention-deficit hyperactivity disorder, combined type: Secondary | ICD-10-CM

## 2022-05-24 DIAGNOSIS — H60333 Swimmer's ear, bilateral: Secondary | ICD-10-CM | POA: Diagnosis not present

## 2022-05-24 MED ORDER — METHYLPHENIDATE HCL 10 MG PO TABS: 10.0000 mg | ORAL_TABLET | Freq: Every morning | ORAL | 0 refills | Status: DC

## 2022-05-24 MED ORDER — NEOMYCIN-POLYMYXIN-HC 3.5-10000-1 OT SOLN: 3.0000 [drp] | Freq: Four times a day (QID) | OTIC | 0 refills | Status: DC

## 2022-05-24 MED ORDER — GABAPENTIN 100 MG PO CAPS: ORAL_CAPSULE | ORAL | 1 refills | Status: DC

## 2022-05-24 NOTE — Progress Notes (Signed)
    SUBJECTIVE:   CHIEF COMPLAINT / HPI:   Bilateral ear pain.  2 week duration.  "Like someone sticking ice picks in my ears."  No fever.  No change in hearing.  Off flonase.  Denies congestion.  Able to clear ears well.  Does get water in ears when showers.  Asks if he can get a refill on ritalin.  Reviewed Dr. Serita Grit note of early July.  He has not yet met his new physician, Dr. Rock Nephew.  States doing well on ritalin without side effects.    OBJECTIVE:   BP 123/81   Pulse 74   Ht $R'5\' 11"'tR$  (1.803 m)   Wt 275 lb 9.6 oz (125 kg)   SpO2 96%   BMI 38.44 kg/m   VS noted  Ears.  Bilateral pain on manipulation of pinna.  Canals red and edematous.  TMs norm Throat normal Neck no sig adenopathy.  ASSESSMENT/PLAN:   Swimmer's ear of both sides Rx for otitis externa.  Avoid water in ears when bathing/showering.  ADHD (attention deficit hyperactivity disorder) Refilled ritalin for 2 months, which should be sufficient time to meet his new doctor.     Zenia Resides, MD [place]
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
## 2022-05-24 NOTE — Patient Instructions (Signed)
I think you have an outer ear infection: AKA swimmers ear.  I prescribed ear drops. I also refilled your ritalin for two months to give you time to see Dr. Anner Crete. Get back on the flonase if you get congested or can't pop your ears.  It prevents infections.

## 2022-05-24 NOTE — Assessment & Plan Note (Signed)
Refilled ritalin for 2 months, which should be sufficient time to meet his new doctor.

## 2022-05-24 NOTE — Telephone Encounter (Signed)
Sent new rx written with appropriate taper up ramp Derrick Carey

## 2022-07-14 IMAGING — DX DG CHEST 2V
2 series · 2 of 2 positions shown · non-contrast
Comparison: 08/02/2019

CLINICAL DATA: Cough

EXAM:
CHEST - 2 VIEW

[chest pa]
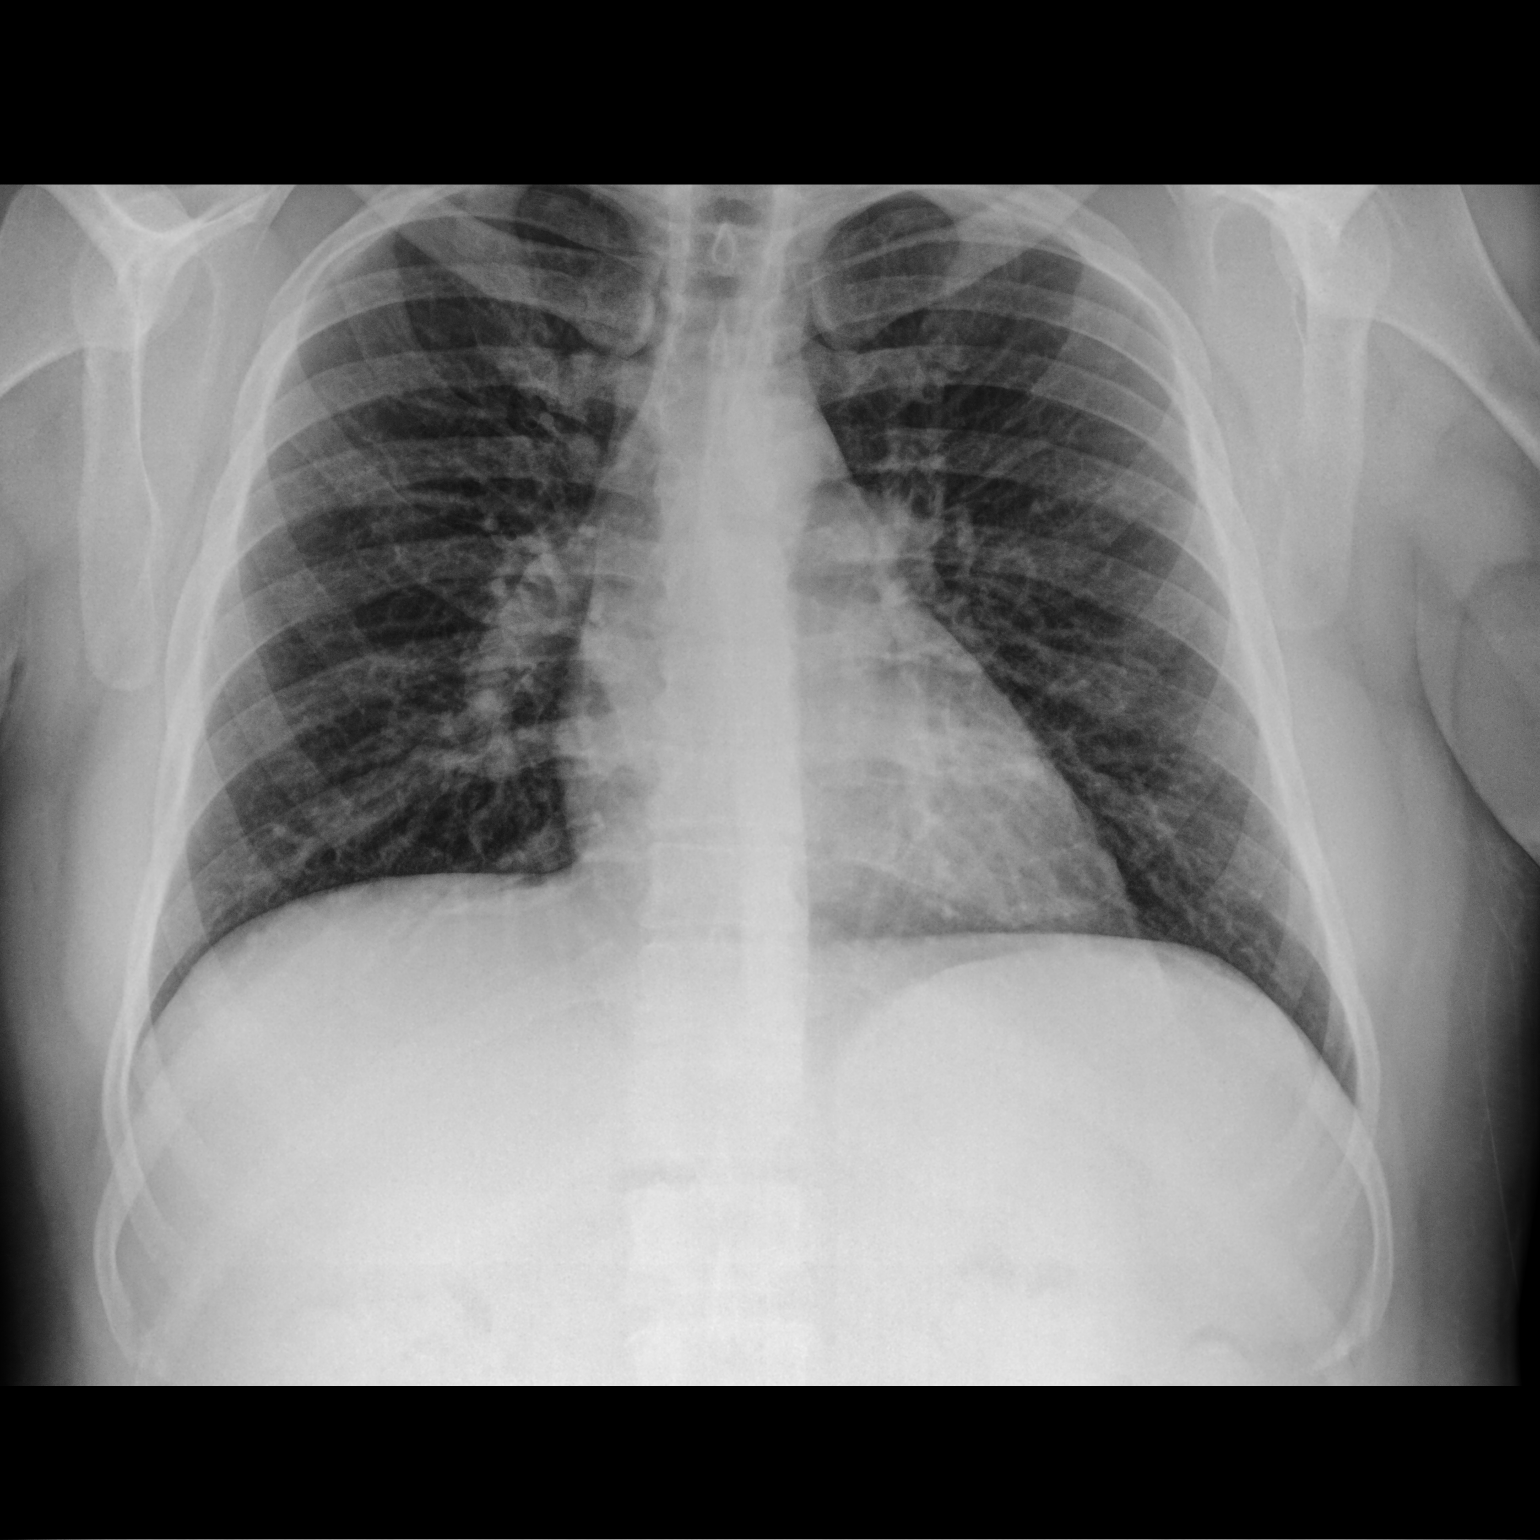

[chest lat]
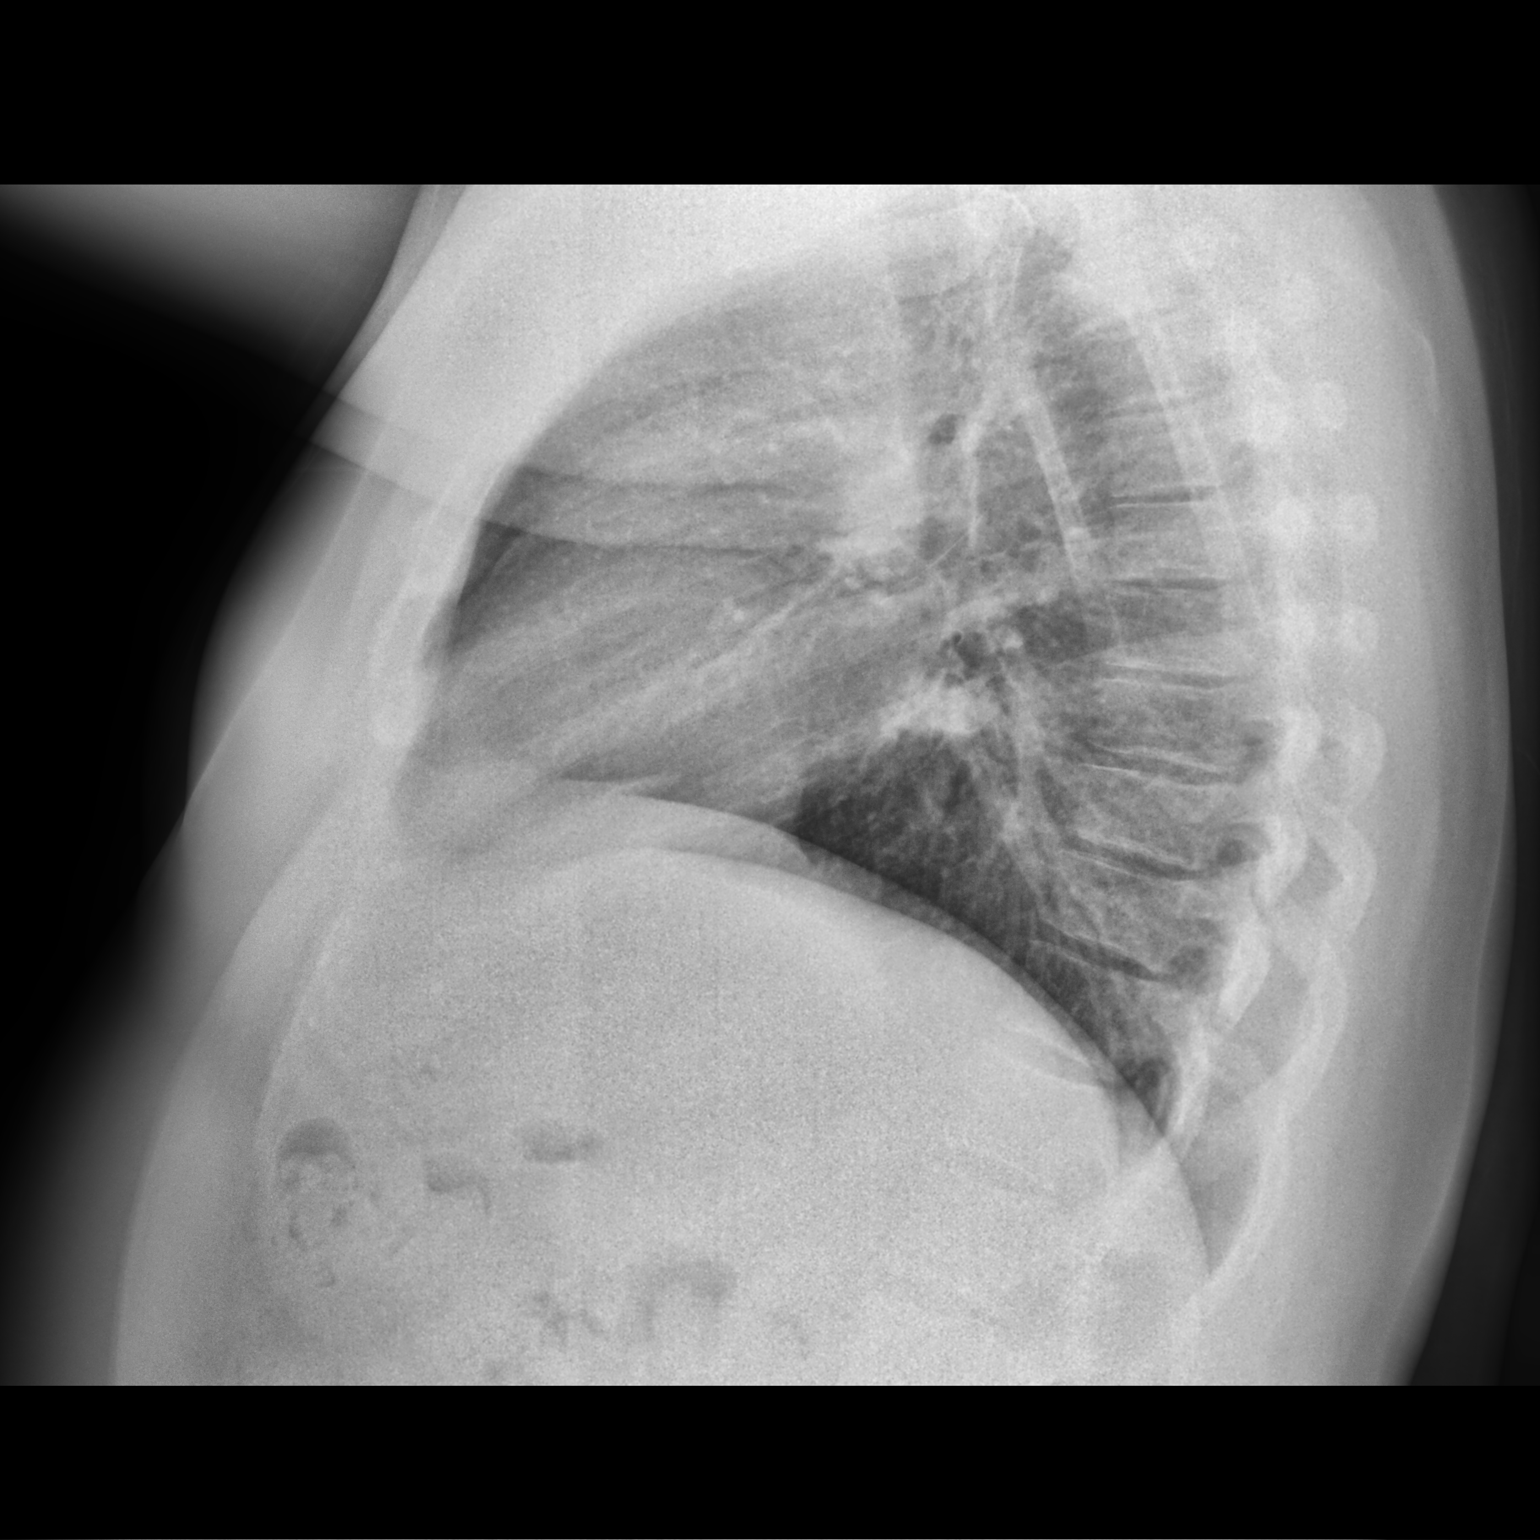

[2 of 2 positions shown; findings below may reference images not displayed]

FINDINGS: The heart size and mediastinal contours are within normal limits.
Both lungs are clear. The visualized skeletal structures are
unremarkable.
IMPRESSION: No active cardiopulmonary disease.

## 2022-07-19 ENCOUNTER — Encounter: Payer: Self-pay | Admitting: Family Medicine

## 2022-07-19 ENCOUNTER — Ambulatory Visit (INDEPENDENT_AMBULATORY_CARE_PROVIDER_SITE_OTHER): Payer: Medicaid Other | Admitting: Family Medicine

## 2022-07-19 ENCOUNTER — Other Ambulatory Visit: Payer: Self-pay

## 2022-07-19 VITALS — BP 132/75 | HR 77 | Wt 276.6 lb

## 2022-07-19 DIAGNOSIS — M25532 Pain in left wrist: Secondary | ICD-10-CM | POA: Diagnosis not present

## 2022-07-19 DIAGNOSIS — M25531 Pain in right wrist: Secondary | ICD-10-CM

## 2022-07-19 DIAGNOSIS — F902 Attention-deficit hyperactivity disorder, combined type: Secondary | ICD-10-CM

## 2022-07-19 DIAGNOSIS — F339 Major depressive disorder, recurrent, unspecified: Secondary | ICD-10-CM | POA: Insufficient documentation

## 2022-07-19 DIAGNOSIS — R4589 Other symptoms and signs involving emotional state: Secondary | ICD-10-CM

## 2022-07-19 MED ORDER — ESCITALOPRAM OXALATE 10 MG PO TABS: 10.0000 mg | ORAL_TABLET | Freq: Every day | ORAL | 2 refills | Status: DC

## 2022-07-19 NOTE — Progress Notes (Signed)
    SUBJECTIVE:   CHIEF COMPLAINT / HPI:   ADHD Follow Up -Currently taking Ritalin 10mg  once daily -Doing well on this, stable on this regimen for long time -Denies adverse effects -He reports difficulty with attention when he misses doses -Not working currently (lost his job during [place])  Bilateral Wrist Pain -Ongoing issue for >1 year -No improvement -Feels it's "actually getting worse" -Saw sports medicine -Did home exercises for wrists, shoulder, and neck-- feels this made it worse -Also used wrist splint in the past, not using currently because it seemed to hurt more -Not taking gabapentin anymore, found a different method to help with sleep (ambient noise).  -Voltaren was helpful -Was advised to do formal physical therapy. Patient is hesitant  Depressed Mood -asked about his mood after PHQ-9 was noted to be abnormal today -he often feels down, depressed -rarely will have passive SI, no plan/intent, no prior attempts -states he "has always been told it comes with the territory with Asberger's and ADHD" -states "fortunately I have people I can talk to": his parents, his very close friend -Has never been on medication to help with mood in the past -Not in therapy. Tried it in the past and did not have a good experience. He felt it was "too much money, not enough progress"  OBJECTIVE:   BP 132/75   Pulse 77   Wt 276 lb 9.6 oz (125.5 kg)   SpO2 98%   BMI 38.58 kg/m  Gen: NAD, pleasant, able to participate in exam CV: RRR, normal S1/S2, no murmur Resp: Normal effort, lungs CTAB Extremities: no edema or cyanosis Skin: warm and dry, no rashes noted Neuro: alert, no obvious focal deficits Psych: Slightly flat affect, normal speech, not responding to internal stimuli, good insight  ASSESSMENT/PLAN:   ADHD (attention deficit hyperactivity disorder) Longstanding issue. Stable on Ritalin 10 mg daily.  PDMP reviewed and appropriate.  Refill sent.  Depressed mood PHQ-9  score of 7 today, with positive response to question #9.  Rare, passive SI only and able to contract for safety. No prior diagnosis of depression, has always been told his low mood is related to Asperger's and ADHD. MDQ with several positive responses but low clinical suspicion for mania.  Will trial Lexapro 10 mg daily. Discussed possible adverse effects and expected time to take effect. Follow-up in 4-6 weeks. Crisis resources given in AVS. Discussed synergistic effects of medication and therapy but patient declines counseling.  Bilateral wrist pain Chronic ongoing issue. Formal PT was recommended previously at Sports Med appointment, but patient was somewhat reluctant. Today he is agreeable and will reach out to sports medicine to follow up on this. Continue Voltaren prn.     Alcus Dad, MD [place]

## 2022-07-19 NOTE — Patient Instructions (Addendum)
It was great to see you!  For your wrists and shoulders: I recommend doing formal physical therapy as suggested by the sports medicine doctors. You can continue using the over-the-counter Voltaren gel if you find this helpful.  I will send refills on your Ritalin by the end of the day.  We are going to start a medication to help with your mood.  Take this once daily.  It may take up to 6 weeks to notice an improvement. If you notice any adverse effects please let us know, although I do not expect this.  If you are ever in crisis: If you are thinking about harming yourself or having thoughts of suicide, or if you know someone who is, seek help right away. Call your doctor or mental health care provider. Call 911 or go to a hospital emergency room to get immediate help, or ask a friend or family member to help you do these things. Call the [place] National Suicide Prevention Lifeline's toll-free, 24-hour hotline at 988 or 1-800-273-TALK (819)587-7990) If you are in crisis, make sure you are not left alone.  If someone else is in crisis, make sure he or she is not left alone  [facility]  [address] , [place], [place] [address]  [phone]  For Crisis: [phone]   Follow-up with me in 4 to 6 weeks.  Dr Rock Nephew

## 2022-07-20 MED ORDER — METHYLPHENIDATE HCL 10 MG PO TABS: 10.0000 mg | ORAL_TABLET | Freq: Every morning | ORAL | 0 refills | Status: DC

## 2022-07-20 NOTE — Assessment & Plan Note (Addendum)
PHQ-9 score of 7 today, with positive response to question #9.  Rare, passive SI only and able to contract for safety. No prior diagnosis of depression, has always been told his low mood is related to Asperger's and ADHD. MDQ with several positive responses but low clinical suspicion for mania.  Will trial Lexapro 10 mg daily. Discussed possible adverse effects and expected time to take effect. Follow-up in 4-6 weeks. Crisis resources given in AVS. Discussed synergistic effects of medication and therapy but patient declines counseling.

## 2022-07-20 NOTE — Assessment & Plan Note (Signed)
Longstanding issue. Stable on Ritalin 10 mg daily.  PDMP reviewed and appropriate.  Refill sent.

## 2022-07-20 NOTE — Assessment & Plan Note (Addendum)
Chronic ongoing issue. Formal PT was recommended previously at Sports Med appointment, but patient was somewhat reluctant. Today he is agreeable and will reach out to sports medicine to follow up on this. Continue Voltaren prn.

## 2022-08-03 ENCOUNTER — Other Ambulatory Visit: Payer: Self-pay

## 2022-08-03 ENCOUNTER — Emergency Department (HOSPITAL_COMMUNITY): Payer: Medicaid Other

## 2022-08-03 ENCOUNTER — Emergency Department (HOSPITAL_COMMUNITY)
Admission: EM | Admit: 2022-08-03 | Discharge: 2022-08-03 | Disposition: A | Discharge status: Home / Self Care | Payer: Medicaid Other | Attending: Emergency Medicine | Admitting: Emergency Medicine

## 2022-08-03 ENCOUNTER — Encounter (HOSPITAL_COMMUNITY): Payer: Self-pay | Admitting: Emergency Medicine

## 2022-08-03 DIAGNOSIS — Y92410 Unspecified street and highway as the place of occurrence of the external cause: Secondary | ICD-10-CM | POA: Diagnosis not present

## 2022-08-03 DIAGNOSIS — R519 Headache, unspecified: Secondary | ICD-10-CM | POA: Insufficient documentation

## 2022-08-03 DIAGNOSIS — M542 Cervicalgia: Secondary | ICD-10-CM | POA: Diagnosis not present

## 2022-08-03 MED ORDER — IBUPROFEN 200 MG PO TABS
600.0000 mg | ORAL_TABLET | Freq: Once | ORAL | Status: AC
  Administered 2022-08-03: 600 mg via ORAL
  Filled 2022-08-03: qty 3
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
## 2022-08-03 NOTE — Discharge Instructions (Signed)
Return for any problem.   Ibuprofen - 600 mg every 8 hours -we will help control your pain at home.  Use ice and or heat as instructed.  Follow-up closely with your regular care provider in the next 2 to 3 days.  If you develop worsening pain or other concern please seek medical attention.
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
## 2022-08-10 ENCOUNTER — Ambulatory Visit: Payer: Medicaid Other

## 2022-08-10 ENCOUNTER — Ambulatory Visit (INDEPENDENT_AMBULATORY_CARE_PROVIDER_SITE_OTHER): Payer: Medicaid Other | Admitting: Family Medicine

## 2022-08-10 DIAGNOSIS — R45851 Suicidal ideations: Secondary | ICD-10-CM

## 2022-08-10 DIAGNOSIS — M542 Cervicalgia: Secondary | ICD-10-CM

## 2022-08-10 HISTORY — DX: Suicidal ideations: R45.851

## 2022-08-10 MED ORDER — BACLOFEN 10 MG PO TABS: 10.0000 mg | ORAL_TABLET | Freq: Three times a day (TID) | ORAL | 0 refills | Status: DC

## 2022-08-10 NOTE — Assessment & Plan Note (Signed)
PHQ-9 positive for questionable 9.  Patient denies any active plan.  He is on an antidepressant and is being followed by his PCP for his depression.  He has a good relationship with his family and is able to talk to them about his feelings.  He states that they are aware of his passive SI.  Feel that he is safe at this time. He has an appointment scheduled with his PCP in a couple weeks to follow-up.  Suicide information provided on AVS.
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
## 2022-08-19 ENCOUNTER — Other Ambulatory Visit: Payer: Self-pay | Admitting: Family Medicine
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
## 2022-08-27 ENCOUNTER — Ambulatory Visit (INDEPENDENT_AMBULATORY_CARE_PROVIDER_SITE_OTHER): Payer: Medicaid Other | Admitting: Family Medicine

## 2022-08-27 ENCOUNTER — Encounter: Payer: Self-pay | Admitting: Family Medicine

## 2022-08-27 VITALS — BP 112/74 | HR 105 | Ht 71.0 in | Wt 278.4 lb

## 2022-08-27 DIAGNOSIS — F339 Major depressive disorder, recurrent, unspecified: Secondary | ICD-10-CM | POA: Diagnosis present

## 2022-08-27 DIAGNOSIS — M79602 Pain in left arm: Secondary | ICD-10-CM | POA: Diagnosis not present

## 2022-08-27 DIAGNOSIS — R29898 Other symptoms and signs involving the musculoskeletal system: Secondary | ICD-10-CM | POA: Diagnosis not present

## 2022-08-27 DIAGNOSIS — M79601 Pain in right arm: Secondary | ICD-10-CM

## 2022-08-27 MED ORDER — ESCITALOPRAM OXALATE 10 MG PO TABS: 10.0000 mg | ORAL_TABLET | Freq: Every day | ORAL | 0 refills | Status: DC

## 2022-08-27 NOTE — Patient Instructions (Addendum)
It was great to see you!  Things we discussed today -I'm glad your mood is improving somewhat. We will keep your Lexapro at the same dose. I will send refills to your pharmacy.  -I will place a referral to neurology for your hand weakness and numbness  -You can continue Baclofen if you find it helpful  -I HIGHLY recommend seeing a therapist. Try the behavioral health center (see info including phone # below) [facility] is an app with some free therapy sessions that you can try as well.  If you are feeling suicidal or depression symptoms worsen please immediately go to:   If you are thinking about harming yourself or having thoughts of suicide, or if you know someone who is, seek help right away. If you are in crisis, make sure you are not left alone.  If someone else is in crisis, make sure he/she/they is not left alone  Call 988 OR 1-800-273-TALK  24 Hour Availability for Walk-IN services  [facility]  [address] [place], [place] [phone] Crisis [phone]   Take care, Dr Anner Crete
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
## 2022-08-28 NOTE — Assessment & Plan Note (Signed)
PHQ-9 score of 8 today, with positive response to question #9. Patient still with intrusive thoughts of wanting to jab himself in the neck with a pen, although these are less frequent and less severe than prior. Able to contract for safety. Discussed increasing dose of Lexapro but patient wishes to stay at current dose. -Continue Lexapro 10mg  daily, refill sent -Recommended therapy in addition to medication. Resources given in AVS -Follow up in 1 month

## 2022-09-08 ENCOUNTER — Encounter: Payer: Self-pay | Admitting: Neurology

## 2022-09-21 ENCOUNTER — Ambulatory Visit (INDEPENDENT_AMBULATORY_CARE_PROVIDER_SITE_OTHER): Payer: Medicaid Other | Admitting: Family Medicine

## 2022-09-21 VITALS — BP 116/86 | HR 87 | Wt 280.6 lb

## 2022-09-21 DIAGNOSIS — F339 Major depressive disorder, recurrent, unspecified: Secondary | ICD-10-CM | POA: Diagnosis present

## 2022-09-21 DIAGNOSIS — F902 Attention-deficit hyperactivity disorder, combined type: Secondary | ICD-10-CM

## 2022-09-21 MED ORDER — METHYLPHENIDATE HCL 10 MG PO TABS: 10.0000 mg | ORAL_TABLET | Freq: Every morning | ORAL | 0 refills | Status: DC

## 2022-09-21 NOTE — Patient Instructions (Addendum)
It was great to see you!  I HIGHLY recommend seeing a therapist to help augment the effects of your Lexapro. Try the behavioral health center (see info including phone # below) [facility] is an app with some free therapy sessions that you can try as well.   If you are feeling suicidal or depression symptoms worsen please immediately go to:    If you are thinking about harming yourself or having thoughts of suicide, or if you know someone who is, seek help right away. If you are in crisis, make sure you are not left alone.  If someone else is in crisis, make sure he/she/they is not left alone   Call 988 OR 1-800-273-TALK   24 Hour Availability for Walk-IN services  [facility]  [address] [place], [place] [phone] Crisis [phone]   Goals: walk for at least 15 minutes 3 days per week between now and your next visit  I will send refills on your Ritalin by the end of the day today.  I will see if our social work team can assist with disability paperwork for you/your parents.  If so, they will reach out by telephone.  Take care and seek immediate care sooner if you develop any concerns.  Dr. Estil Daft [facility]

## 2022-09-21 NOTE — Progress Notes (Signed)
    SUBJECTIVE:   CHIEF COMPLAINT / HPI:   Depression Follow Up -Feels like his depression has improved since last visit  -States he wishes the PHQ-9 had 0.5 pt options -Remains on Lexapro 10mg  -Wants to stay at same dose of Lexapro, tolerating it well -Still with occasional intrusive thoughts, states "I've been dealing with those all my life" but they are "muted" with the Lexapro -No active SI -Not in therapy  ADHD Longstanding, has been well-controlled on Ritalin for a long time. He reports the Ritalin helps him get through his day, helps him focus on tasks. 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 PHQ-9 score of 7 today with positive response to question #9-- has longstanding passive SI, no active SI. -Continue Lexapro 10mg  daily -Once again encouraged patient to pursue therapy. Resources provided in AVS -Crisis resources discussed  ADHD (attention deficit hyperactivity disorder) Well controlled on Ritalin 10mg  daily. PDMP reviewed and appropriate. Refill sent.  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 Letter provided. Will attempt CCM referral for additional assistance re: caregiver support, appropriate disability paperwork, etc.   , MD [facility]

## 2022-09-23 NOTE — Assessment & Plan Note (Signed)
Well controlled on Ritalin 10mg  daily. PDMP reviewed and appropriate. Refill sent.

## 2022-09-23 NOTE — Assessment & Plan Note (Signed)
Improved from prior per patient report. PHQ-9 score of 7 today with positive response to question #9-- has longstanding passive SI, no active SI. -Continue Lexapro 10mg  daily -Once again encouraged patient to pursue therapy. Resources provided in AVS -Crisis resources discussed

## 2022-09-24 ENCOUNTER — Telehealth: Payer: Self-pay | Admitting: *Deleted

## 2022-09-24 NOTE — Progress Notes (Unsigned)
  Care Coordination  Outreach Note  09/24/2022 Name: NEDIM OKI MRN: [id number] DOB: 11-May-1991   Care Coordination Outreach Attempts: An unsuccessful telephone outreach was attempted today to offer the patient information about available care coordination services as a benefit of their health plan.   Follow Up Plan:  Additional outreach attempts will be made to offer the patient care coordination information and services.   Encounter Outcome:  No Answer  Christie Nottingham  Care Coordination Care Guide  Direct Dial: [phone]

## 2022-09-27 NOTE — Progress Notes (Unsigned)
  Care Coordination  Outreach Note  09/27/2022 Name: Derrick Carey MRN: [id number] DOB: 07-Mar-1991   Care Coordination Outreach Attempts: A second unsuccessful outreach was attempted today to offer the patient with information about available care coordination services as a benefit of their health plan.     Follow Up Plan:  Additional outreach attempts will be made to offer the patient care coordination information and services.   Encounter Outcome:  No Answer  Christie Nottingham  Care Coordination Care Guide  Direct Dial: [phone]

## 2022-09-29 NOTE — Progress Notes (Signed)
  Care Coordination  Outreach Note  09/29/2022 Name: Derrick Carey MRN: [id number] DOB: [birth year]   Care Coordination Outreach Attempts: A third unsuccessful outreach was attempted today to offer the patient with information about available care coordination services as a benefit of their health plan.   Follow Up Plan:  No further outreach attempts will be made at this time. We have been unable to contact the patient to offer or enroll patient in care coordination services  Encounter Outcome:  No Answer  Christie Nottingham  Care Coordination Care Guide  Direct Dial: [phone]
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
## 2022-10-29 ENCOUNTER — Other Ambulatory Visit (INDEPENDENT_AMBULATORY_CARE_PROVIDER_SITE_OTHER): Payer: Medicaid Other

## 2022-10-29 ENCOUNTER — Ambulatory Visit: Payer: Medicaid Other | Admitting: Neurology

## 2022-10-29 ENCOUNTER — Encounter: Payer: Self-pay | Admitting: Neurology

## 2022-10-29 VITALS — BP 146/84 | HR 110 | Ht 71.0 in | Wt 284.0 lb

## 2022-10-29 DIAGNOSIS — M79602 Pain in left arm: Secondary | ICD-10-CM

## 2022-10-29 DIAGNOSIS — M79601 Pain in right arm: Secondary | ICD-10-CM

## 2022-10-29 DIAGNOSIS — M542 Cervicalgia: Secondary | ICD-10-CM | POA: Diagnosis not present

## 2022-10-29 DIAGNOSIS — R29898 Other symptoms and signs involving the musculoskeletal system: Secondary | ICD-10-CM | POA: Diagnosis not present

## 2022-10-29 NOTE — Patient Instructions (Signed)
I am not sure the cause of your symptoms. I would like to investigate further with the following: -Blood work today -MRI brain and cervical (neck) spine. You will get a call to schedule this. If you do not hear from anyone in 1-2 weeks, please let me know  I will be in touch when I have your results.  Please let me know if you have any questions or concerns in the meantime.  The physicians and staff at [facility] are committed to providing excellent care. You may receive a survey requesting feedback about your experience at our office. We strive to receive "very good" responses to the survey questions. If you feel that your experience would prevent you from giving the office a "very good " response, please contact our office to try to remedy the situation. We may be reached at [phone]. Thank you for taking the time out of your busy day to complete the survey.  Kai Levins, MD [facility]

## 2022-10-30 LAB — COMPREHENSIVE METABOLIC PANEL
AG Ratio: 1.6 (calc) (ref 1.0–2.5)
ALT: 21 U/L (ref 9–46)
AST: 18 U/L (ref 10–40)
Albumin: 4.5 g/dL (ref 3.6–5.1)
Alkaline phosphatase (APISO): 71 U/L (ref 36–130)
BUN: 12 mg/dL (ref 7–25)
CO2: 22 mmol/L (ref 20–32)
Calcium: 9.2 mg/dL (ref 8.6–10.3)
Chloride: 105 mmol/L (ref 98–110)
Creat: 0.88 mg/dL (ref 0.60–1.26)
Globulin: 2.8 g/dL (calc) (ref 1.9–3.7)
Glucose, Bld: 91 mg/dL (ref 65–99)
Potassium: 4.3 mmol/L (ref 3.5–5.3)
Sodium: 139 mmol/L (ref 135–146)
Total Bilirubin: 0.3 mg/dL (ref 0.2–1.2)
Total Protein: 7.3 g/dL (ref 6.1–8.1)

## 2022-10-30 LAB — CBC
HCT: 47.5 % (ref 38.5–50.0)
Hemoglobin: 16.4 g/dL (ref 13.2–17.1)
MCH: 29.6 pg (ref 27.0–33.0)
MCHC: 34.5 g/dL (ref 32.0–36.0)
MCV: 85.7 fL (ref 80.0–100.0)
MPV: 12.9 fL — ABNORMAL HIGH (ref 7.5–12.5)
Platelets: 220 10*3/uL (ref 140–400)
RBC: 5.54 10*6/uL (ref 4.20–5.80)
RDW: 12.3 % (ref 11.0–15.0)
WBC: 10.8 10*3/uL (ref 3.8–10.8)

## 2022-10-30 LAB — TSH: TSH: 1.08 mIU/L (ref 0.40–4.50)

## 2022-10-30 LAB — VITAMIN B12: Vitamin B-12: 317 pg/mL (ref 200–1100)

## 2022-10-30 LAB — VITAMIN D 25 HYDROXY (VIT D DEFICIENCY, FRACTURES): Vit D, 25-Hydroxy: 7 ng/mL — ABNORMAL LOW (ref 30–100)

## 2022-11-01 ENCOUNTER — Encounter: Payer: Self-pay | Admitting: Neurology

## 2022-12-06 ENCOUNTER — Ambulatory Visit
Admission: RE | Admit: 2022-12-06 | Discharge: 2022-12-06 | Disposition: A | Discharge status: Home / Self Care | Payer: Medicaid Other | Source: Ambulatory Visit | Attending: Neurology | Admitting: Neurology

## 2022-12-06 DIAGNOSIS — M542 Cervicalgia: Secondary | ICD-10-CM

## 2022-12-06 DIAGNOSIS — M79601 Pain in right arm: Secondary | ICD-10-CM

## 2022-12-06 DIAGNOSIS — R29898 Other symptoms and signs involving the musculoskeletal system: Secondary | ICD-10-CM

## 2022-12-06 MED ORDER — GADOPICLENOL 0.5 MMOL/ML IV SOLN
10.0000 mL | Freq: Once | INTRAVENOUS | Status: AC | PRN
  Administered 2022-12-06: 10 mL via INTRAVENOUS

## 2022-12-07 ENCOUNTER — Encounter: Payer: Self-pay | Admitting: Neurology

## 2022-12-07 ENCOUNTER — Telehealth: Payer: Self-pay | Admitting: Neurology

## 2022-12-07 ENCOUNTER — Other Ambulatory Visit: Payer: Self-pay

## 2022-12-07 DIAGNOSIS — M79601 Pain in right arm: Secondary | ICD-10-CM

## 2022-12-07 DIAGNOSIS — R29898 Other symptoms and signs involving the musculoskeletal system: Secondary | ICD-10-CM

## 2022-12-07 NOTE — Telephone Encounter (Signed)
Called patient to discuss the results of his MRI brain and cervical spine. His mother was also on the call. While there were a few minor abnormalities mentioned by radiology, there was nothing that would explain symptoms and overall, abnormalities were minor.   We discussed that the next step for his arm symptoms would be evaluating the peripheral nerves with an EMG. Patient was in agreement.  All questions were answered.  Kai Levins, MD [facility]

## 2022-12-08 ENCOUNTER — Telehealth: Payer: Self-pay

## 2022-12-08 NOTE — Telephone Encounter (Signed)
Dianne from [facility], Addendum of MRI Cervical spine 12/07/22.

## 2022-12-09 ENCOUNTER — Telehealth: Payer: Self-pay

## 2022-12-09 DIAGNOSIS — R29898 Other symptoms and signs involving the musculoskeletal system: Secondary | ICD-10-CM

## 2022-12-09 DIAGNOSIS — M542 Cervicalgia: Secondary | ICD-10-CM

## 2022-12-09 DIAGNOSIS — M79602 Pain in left arm: Secondary | ICD-10-CM

## 2022-12-09 NOTE — Telephone Encounter (Signed)
-----   Message from Shellia Carwin, MD sent at 12/09/2022  3:50 PM EST ----- Regarding: FW: MRI Justene Jensen,  I spoke to this patient yesterday and told him about results of his MRIs. There was an area of the cervical spine MRI that showed what they called a cyst on his back. The report was updated overnight and now they are recommending an MRI thoracic spine with and without contrast to make sure they know what it is. Can you call the patient and let him know? If he is okay with the MRI thoracic spine, can you order for him?  Thank you,  Kai Levins, MD   ----- Message ----- From: Shellia Carwin, MD Sent: 12/08/2022   7:43 AM EST To: Renae Gloss, LPN Subject: MRI                                            Lariza Cothron,  I spoke to this patient yesterday and told him about results of his MRIs. There was an area of the cervical spine MRI that showed what they called a cyst on his back. The report was updated overnight and now they are recommending an MRI thoracic spine with and without contrast to make sure they know what it is. Can you call the patient and let him know? If he is okay with the MRI thoracic spine, can you order for him?  Thank you,  Kai Levins, MD

## 2022-12-09 NOTE — Telephone Encounter (Signed)
Called Pt and left message to call back.

## 2022-12-10 NOTE — Telephone Encounter (Signed)
Called pt and informed him and his Mother of Dr. Karren Burly orders and they were okay to order MRI thoracic spine with and without contrast.

## 2022-12-13 ENCOUNTER — Other Ambulatory Visit: Payer: Self-pay

## 2022-12-13 DIAGNOSIS — F902 Attention-deficit hyperactivity disorder, combined type: Secondary | ICD-10-CM

## 2022-12-14 MED ORDER — METHYLPHENIDATE HCL 10 MG PO TABS: 10.0000 mg | ORAL_TABLET | Freq: Every morning | ORAL | 0 refills | Status: DC

## 2022-12-14 MED ORDER — ESCITALOPRAM OXALATE 10 MG PO TABS: 10.0000 mg | ORAL_TABLET | Freq: Every day | ORAL | 0 refills | Status: DC

## 2022-12-14 MED ORDER — BACLOFEN 10 MG PO TABS: ORAL_TABLET | ORAL | 0 refills | Status: DC

## 2022-12-20 ENCOUNTER — Telehealth: Payer: Self-pay | Admitting: Neurology

## 2022-12-20 ENCOUNTER — Ambulatory Visit: Payer: Medicaid Other | Admitting: Neurology

## 2022-12-20 DIAGNOSIS — M79601 Pain in right arm: Secondary | ICD-10-CM

## 2022-12-20 DIAGNOSIS — M79602 Pain in left arm: Secondary | ICD-10-CM

## 2022-12-20 DIAGNOSIS — R29898 Other symptoms and signs involving the musculoskeletal system: Secondary | ICD-10-CM

## 2022-12-20 NOTE — Telephone Encounter (Signed)
Discussed the results of patient's EMG after the procedure today. EMG was normal.   The patient's central and peripheral nervous system have been evaluated without any cause of pain in both arms. There is not a clear neurologic cause currently. Patient understood.  All questions were answered.  Kai Levins, MD [facility]

## 2022-12-20 NOTE — Procedures (Signed)
[facility]  [address]  [place], [place] [address] Tel: [phone] Fax: [phone] Test Date:  12/20/2022  Patient: Derrick Carey DOB: 13-Jul-1991 Physician: Kai Levins, MD  Sex: Male Height: '5\' 11"'$  Ref Phys: Kai Levins, MD  ID#: [id number]   Technician:    History: This is a 32 year old male with hand weakness and pain.  NCV & EMG Findings: Extensive electrodiagnostic evaluation of bilateral upper limbs shows: Bilateral median, ulnar, and radial sensory responses are within normal limits. Bilateral median (APB) and ulnar (ADM) motor responses are within normal limits. Bilateral ulnar (ADM) F waves are within normal limits. There is no evidence of active or chronic motor axon loss changes affecting any of the tested muscles on needle examination. Motor unit configuration and recruitment pattern is within normal limits.  Impression: This is a normal study. There is no evidence of a right or left cervical (C5-C8) motor radiculopathy and no evidence of left or right brachial plexopathy, and no evidence of right or left median or ulnar mononeuropathies.   ___________________________ Kai Levins, MD    Nerve Conduction Studies Motor Nerve Results    Latency Amplitude F-Lat Segment Distance CV Comment  Site (ms) Norm (mV) Norm (ms)  (cm) (m/s) Norm   Left Median (APB) Motor  Wrist 2.2  < 3.9 7.7  > 6.0        Elbow 7.9 - 7.7 -  Elbow-Wrist 30 53  > 50   Right Median (APB) Motor  Wrist 2.6  < 3.9 11.4  > 6.0        Elbow 7.6 - 11.4 -  Elbow-Wrist 31 62  > 50   Left Ulnar (ADM) Motor  Wrist 1.98  < 3.1 9.4  > 7.0 27.8       Bel elbow 6.1 - 8.0 -  Bel elbow-Wrist 25.5 62  > 50   Ab elbow 7.8 - 7.9 -  Ab elbow-Bel elbow 10 59 -   Right Ulnar (ADM) Motor  Wrist 1.83  < 3.1 11.1  > 7.0 29.2       Bel elbow 6.2 - 10.5 -  Bel elbow-Wrist 26.5 60  > 50   Ab elbow 8.1 - 9.9 -  Ab elbow-Bel elbow 10 53 -    Sensory Sites    Neg Peak Lat Amplitude  (O-P) Segment Distance Velocity Comment  Site (ms) Norm (V) Norm  (cm) (ms)   Left Median Sensory  Wrist-Dig II 2.6  < 3.4 64  > 20 Wrist-Dig II 13    Right Median Sensory  Wrist 2.9  < 3.4 45  > 20 Wrist-Dig II 13    Left Radial Sensory  Forearm-Wrist 1.90  < 2.7 53  > 18 Forearm-Wrist 10    Right Radial Sensory  Forearm-Wrist 2.0  < 2.7 47  > 18 Forearm-Wrist 10    Left Ulnar Sensory  Wrist-Dig V 2.4  < 3.1 44  > 12 Wrist-Dig V 11    Right Ulnar Sensory  Wrist-Dig V 2.4  < 3.1 44  > 12 Wrist-Dig V 11     Electromyography   Side Muscle Ins.Act Fibs Fasc Recrt Amp Dur Poly Activation Comment  Left FDI Nml Nml Nml Nml Nml Nml Nml Nml N/A  Left EIP Nml Nml Nml Nml Nml Nml Nml Nml N/A  Left Pronator teres Nml Nml Nml Nml Nml Nml Nml Nml N/A  Left Biceps Nml Nml Nml Nml Nml Nml Nml Nml N/A  Left Triceps Nml Nml Nml Nml Nml Nml Nml Nml N/A  Left Deltoid Nml Nml Nml Nml Nml Nml Nml Nml N/A  Right FDI Nml Nml Nml Nml Nml Nml Nml Nml N/A  Right EIP Nml Nml Nml Nml Nml Nml Nml Nml N/A  Right Pronator teres Nml Nml Nml Nml Nml Nml Nml Nml N/A  Right Biceps Nml Nml Nml Nml Nml Nml Nml Nml N/A  Right Triceps Nml Nml Nml Nml Nml Nml Nml Nml N/A  Right Deltoid Nml Nml Nml Nml Nml Nml Nml Nml N/A      Waveforms:  Motor           Sensory               F-Wave

## 2023-01-06 ENCOUNTER — Ambulatory Visit
Admission: RE | Admit: 2023-01-06 | Discharge: 2023-01-06 | Disposition: A | Discharge status: Home / Self Care | Payer: Medicaid Other | Source: Ambulatory Visit | Attending: Neurology | Admitting: Neurology

## 2023-01-06 MED ORDER — GADOPICLENOL 0.5 MMOL/ML IV SOLN
10.0000 mL | Freq: Once | INTRAVENOUS | Status: AC | PRN
  Administered 2023-01-06: 10 mL via INTRAVENOUS

## 2023-01-11 ENCOUNTER — Encounter: Payer: Self-pay | Admitting: Family Medicine

## 2023-01-11 ENCOUNTER — Encounter: Payer: Self-pay | Admitting: Neurology

## 2023-01-11 ENCOUNTER — Ambulatory Visit (INDEPENDENT_AMBULATORY_CARE_PROVIDER_SITE_OTHER): Payer: Medicaid Other | Admitting: Family Medicine

## 2023-01-11 VITALS — BP 121/87 | HR 79 | Ht 72.64 in | Wt 279.4 lb

## 2023-01-11 DIAGNOSIS — F339 Major depressive disorder, recurrent, unspecified: Secondary | ICD-10-CM | POA: Diagnosis present

## 2023-01-11 DIAGNOSIS — M79602 Pain in left arm: Secondary | ICD-10-CM

## 2023-01-11 DIAGNOSIS — M79601 Pain in right arm: Secondary | ICD-10-CM

## 2023-01-11 NOTE — Assessment & Plan Note (Signed)
Chronic, stable. Continues to have passive SI which is longstanding, but greatly improved on Lexapro per patient report. Continue Lexapro 10mg  daily. Offered increased dose but patient declines for now. Once again encouraged him to engage in counseling. Therapy and crisis resources provided in AVS.

## 2023-01-11 NOTE — Assessment & Plan Note (Signed)
Chronic pain throughout neck, back, and bilateral upper extremities. Saw neurology who ordered nerve conduction studies which were normal and MRI T-spine which showed several abnormalities including degenerative spondylosis with disc protrusion and multilevel facet hypertrophy. Advised patient follow-up with his neurologist to further discuss results and next steps.

## 2023-01-11 NOTE — Patient Instructions (Addendum)
I recommend you follow-up with the neurologist to further discuss your MRI results and the next steps.  Long term goals we identified in terms of managing your pain include getting back to computer work and cleaning the house.   See me back in 2 months for an annual physical and blood work.  I still recommend counseling/therapy to help with mood, stress, and coping with some of your pain.  You can try apps such as [employer] or BetterHelp. You can also use psychologytoday.com to find a therapist.  [facility] also offers therapy [place], [place] [phone] Crisis [phone]  As always, the suicide hotline is 55 if you ever need it.  Take care, Dr Rock Nephew

## 2023-01-11 NOTE — Progress Notes (Signed)
    SUBJECTIVE:   CHIEF COMPLAINT / HPI:   Patient here to follow-up on his longstanding neck, back, and bilateral upper extremity pain. States he had nerve conduction studies and an MRI and is wondering next steps. Taking Baclofen 10mg  once daily which he does find helpful. States it takes the edge off so he can get through the day. Long term goals are getting back to computer work and cleaning his house.  Also following up on his depression. He finds Lexapro 10mg  daily to be extremely helpful. Still has passive SI on a daily basis but it's much more manageable than previously. Not in therapy.   PERTINENT  PMH / PSH: obesity  OBJECTIVE:   BP 121/87   Pulse 79   Ht 6' 0.64" (1.845 m)   Wt 279 lb 6.4 oz (126.7 kg)   SpO2 95%   BMI 37.23 kg/m   General: NAD, pleasant, able to participate in exam Respiratory: No respiratory distress Skin: warm and dry, no rashes noted Psych: Normal affect and mood Neck: supple, FROM Neuro: grossly intact, sensation intact to light touch in bilateral upper extremities  ASSESSMENT/PLAN:   Bilateral arm pain Chronic pain throughout neck, back, and bilateral upper extremities. Saw neurology who ordered nerve conduction studies which were normal and MRI T-spine which showed several abnormalities including degenerative spondylosis with disc protrusion and multilevel facet hypertrophy. Advised patient follow-up with his neurologist to further discuss results and next steps.  Major depression, recurrent, chronic (HCC) Chronic, stable. Continues to have passive SI which is longstanding, but greatly improved on Lexapro per patient report. Continue Lexapro 10mg  daily. Offered increased dose but patient declines for now. Once again encouraged him to engage in counseling. Therapy and crisis resources provided in AVS.  Follow up in 2 months for annual physical.   Alcus Dad, MD [place]

## 2023-01-12 ENCOUNTER — Telehealth: Payer: Self-pay

## 2023-01-12 NOTE — Telephone Encounter (Signed)
Patient would like to follow up and talk bases with Dr.Hill after the MRI but is unsure if he needs to come in or if he needs to send a message.

## 2023-01-13 ENCOUNTER — Telehealth: Payer: Self-pay | Admitting: Anesthesiology

## 2023-01-13 NOTE — Telephone Encounter (Signed)
Called pt and message was left to call office.

## 2023-01-13 NOTE — Telephone Encounter (Signed)
Called pt and informed him of answers that Dr. Berdine Addison reported to this nurse. Just reminded him of Dr. Berdine Addison report from the imaging's. Told him Dr. Berdine Addison said the pain in shoulders and hands are not neurological. Suggested for him to see a orthopedic doctor if he choose to. He understood, I asked if he had anymore questions are concerns and he said no. And wished me a good weekend.

## 2023-01-13 NOTE — Telephone Encounter (Signed)
Pt called requesting MRI results.  

## 2023-02-04 ENCOUNTER — Ambulatory Visit (INDEPENDENT_AMBULATORY_CARE_PROVIDER_SITE_OTHER): Payer: Medicaid Other | Admitting: Family Medicine

## 2023-02-04 ENCOUNTER — Encounter: Payer: Self-pay | Admitting: Family Medicine

## 2023-02-04 VITALS — BP 127/76 | HR 75 | Wt 281.6 lb

## 2023-02-04 DIAGNOSIS — R45851 Suicidal ideations: Secondary | ICD-10-CM | POA: Diagnosis not present

## 2023-02-04 DIAGNOSIS — M79602 Pain in left arm: Secondary | ICD-10-CM | POA: Diagnosis not present

## 2023-02-04 DIAGNOSIS — M79601 Pain in right arm: Secondary | ICD-10-CM

## 2023-02-04 DIAGNOSIS — M542 Cervicalgia: Secondary | ICD-10-CM

## 2023-02-04 MED ORDER — BACLOFEN 10 MG PO TABS: 10.0000 mg | ORAL_TABLET | Freq: Three times a day (TID) | ORAL | 0 refills | Status: DC | PRN
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
## 2023-02-04 NOTE — Patient Instructions (Signed)
It was great to see you!  I placed a referral to PM&R (physical medicine & rehabilitation). They are experts in the realm of pain management and may have additional options for you. They should call for an appointment but it can take a few weeks.  As always, if you're ever in a mental health crisis, make sure you're not alone and seek help right away. The following have 24/7 access: Suicide Hotline (988)  [facility]  [address] [place], [place] [phone] Crisis [phone]   Take care, Dr Anner Crete

## 2023-02-05 NOTE — Assessment & Plan Note (Signed)
Chronic x4 years. Workup including nerve conduction studies and MRI of cervical and thoracic spine did not reveal any specific etiology. Patient has failed multiple medications including gabapentin, cymbalta, NSAIDs, and muscle relaxers. Also saw sports med and neuro without improvement. Patient not interested in PT at this time. Referral placed to PM&R for additional options/assistance with pain management.

## 2023-02-05 NOTE — Assessment & Plan Note (Signed)
Longstanding, unchanged. Continue Lexapro  daily. Crisis resources provided in AVS

## 2023-02-15 ENCOUNTER — Encounter: Payer: Self-pay | Admitting: Physical Medicine & Rehabilitation

## 2023-03-11 ENCOUNTER — Encounter: Payer: Self-pay | Admitting: Physical Medicine & Rehabilitation

## 2023-03-11 ENCOUNTER — Encounter: Payer: Medicaid Other | Attending: Physical Medicine & Rehabilitation | Admitting: Physical Medicine & Rehabilitation

## 2023-03-11 VITALS — BP 125/80 | HR 78 | Ht 72.0 in | Wt 283.0 lb

## 2023-03-11 DIAGNOSIS — F32A Depression, unspecified: Secondary | ICD-10-CM | POA: Diagnosis present

## 2023-03-11 DIAGNOSIS — M542 Cervicalgia: Secondary | ICD-10-CM | POA: Diagnosis present

## 2023-03-11 DIAGNOSIS — G8929 Other chronic pain: Secondary | ICD-10-CM

## 2023-03-11 DIAGNOSIS — M79601 Pain in right arm: Secondary | ICD-10-CM | POA: Diagnosis present

## 2023-03-11 DIAGNOSIS — M79602 Pain in left arm: Secondary | ICD-10-CM | POA: Diagnosis present

## 2023-03-11 DIAGNOSIS — M546 Pain in thoracic spine: Secondary | ICD-10-CM | POA: Diagnosis present

## 2023-03-11 MED ORDER — PREGABALIN 75 MG PO CAPS: 75.0000 mg | ORAL_CAPSULE | Freq: Two times a day (BID) | ORAL | 2 refills | Status: DC

## 2023-03-11 NOTE — Patient Instructions (Signed)
Start gradual progressive exercise- tai chi We will start lyrica 75mg  twice a day I will order Zynex nexwave device, you should get a call about this
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
## 2023-03-18 ENCOUNTER — Ambulatory Visit (INDEPENDENT_AMBULATORY_CARE_PROVIDER_SITE_OTHER): Payer: Medicaid Other | Admitting: Family Medicine

## 2023-03-18 ENCOUNTER — Encounter: Payer: Self-pay | Admitting: Family Medicine

## 2023-03-18 VITALS — BP 122/76 | HR 71 | Ht 72.0 in | Wt 279.8 lb

## 2023-03-18 DIAGNOSIS — G8929 Other chronic pain: Secondary | ICD-10-CM

## 2023-03-18 DIAGNOSIS — E669 Obesity, unspecified: Secondary | ICD-10-CM | POA: Diagnosis not present

## 2023-03-18 DIAGNOSIS — F339 Major depressive disorder, recurrent, unspecified: Secondary | ICD-10-CM

## 2023-03-18 DIAGNOSIS — Z6837 Body mass index (BMI) 37.0-37.9, adult: Secondary | ICD-10-CM | POA: Diagnosis not present

## 2023-03-18 NOTE — Patient Instructions (Signed)
It was lovely to see you, as always!  Things we discussed at today's visit: -I agree with the diagnosis of fibromyalgia and recommendation for Lyrica.  Continue working your way up to taking this twice daily. -As discussed, exercise is one of the most beneficial treatments for fibromyalgia.  While you may have some worsening of your pain at first, this should be brief and exercise is proven to be effective long-term. Exercise will also help with weight management AND mood! -Continue your Lexapro -I still recommend seeing a therapist and you can find one on [email or web address]. Or schedule an appointment at  [facility]  [address] [place], [place] [phone] Crisis [phone]  Follow up in 2 months to meet your new doctor  Take care and seek immediate care sooner if you develop any concerns.  Dr. [facility]

## 2023-03-18 NOTE — Progress Notes (Unsigned)
    SUBJECTIVE:   CHIEF COMPLAINT / HPI:   Depression Follow-up Here to follow up on depression. Taking Lexapro 10mg  daily. Finds this dose helpful. Still has significant depressive symptoms and intrusive thoughts which sometimes involve thoughts of self-harm. No intent or plan. Has dealt with this his whole life. Does not wish to adjust his lexapro dose at this time. Not involved in therapy-- in general has not found therapy helpful and is hesitant to re-try.  Chronic pain f/u Also here to follow-up on chronic pain in his neck and bilateral upper extremities. Reports he saw PM&R who suspects fibromyalgia and started Lyrica 75mg  daily. Currently taking Lyrica 75mg  once daily and working his way up to twice daily as he's sensitive to medication in general. Still having significant issues with pain but understands it's a gradual process to find the right medication regimen.   PERTINENT  PMH / PSH: Asberger's, ADHD  OBJECTIVE:   BP 122/76   Pulse 71   Ht 6' (1.829 m)   Wt 279 lb 12.8 oz (126.9 kg)   SpO2 100%   BMI 37.95 kg/m   General: NAD, pleasant, able to participate in exam Respiratory: No respiratory distress Skin: warm and dry, no rashes noted Psych: appropriately groomed, mildly flat affect, congruent mood, normal speech, no hallucinations/delusions Neuro: grossly intact   ASSESSMENT/PLAN:   Major depression, recurrent, chronic (HCC) Chronic, stable. Continues to have passive SI but is improved with Lexapro 10mg  daily per patient report. Again offered increased dose of Lexapro or addition of bupropion but patient declines medication adjustment at this time. As always, I encouraged him to try cognitive behavior therapy again as this could help with both mood and chronic pain management.  Other chronic pain Recently given diagnosis of fibromyalgia by PM&R, which I agree with. Continue Lyrica 75mg  daily with goal of increasing to twice daily as soon as he can tolerate this. Has  f/u with PM&R in 1 month. We discussed importance of exercise in fibromyalgia management and patient has plans to start tai-chi in addition to his current walking.  Obesity Recommend screening lipid panel and A1c-- patient declines blood work today but would consider at next visit.     Maury Dus, MD [facility]

## 2023-03-19 DIAGNOSIS — G8929 Other chronic pain: Secondary | ICD-10-CM | POA: Insufficient documentation

## 2023-03-19 NOTE — Assessment & Plan Note (Signed)
Chronic, stable. Continues to have passive SI but is improved with Lexapro 10mg  daily per patient report. Again offered increased dose of Lexapro or addition of bupropion but patient declines medication adjustment at this time. As always, I encouraged him to try cognitive behavior therapy again as this could help with both mood and chronic pain management.

## 2023-03-19 NOTE — Assessment & Plan Note (Signed)
Recommend screening lipid panel and A1c-- patient declines blood work today but would consider at next visit.

## 2023-03-19 NOTE — Assessment & Plan Note (Addendum)
Recently given diagnosis of fibromyalgia by PM&R, which I agree with. Continue Lyrica 75mg  daily with goal of increasing to twice daily as soon as he can tolerate this. Has f/u with PM&R in 1 month. We discussed importance of exercise in fibromyalgia management and patient has plans to start tai-chi in addition to his current walking.

## 2023-04-08 ENCOUNTER — Ambulatory Visit: Payer: Medicaid Other | Admitting: Family Medicine

## 2023-04-08 NOTE — Progress Notes (Deleted)
    SUBJECTIVE:   Chief complaint/HPI: annual examination  Derrick Carey is a 32 y.o. who presents today for an annual exam.   Reviewed and updated history ***.   Review of systems form notable for ***.    OBJECTIVE:   There were no vitals taken for this visit.  ***  ASSESSMENT/PLAN:   No problem-specific Assessment & Plan notes found for this encounter.    Annual Examination  See AVS for age appropriate recommendations  PHQ score ***, reviewed and discussed.  Blood pressure reviewed and at goal. ***   Advanced directive ***   Considered the following items based upon USPSTF recommendations: HIV testing: {not indicated/requested/declined:14582} Hepatitis C: {not indicated/requested/declined:14582} Hepatitis B: {not indicated/requested/declined:14582} Syphilis if at high risk: {{not indicated/requested/declined:14582} GC/CT{not indicated/requested/declined:14582} Lipid panel (nonfasting or fasting) discussed based upon AHA recommendations and {ordered not order:23822}.  Consider repeat every 4-6 years.  Reviewed risk factors for latent tuberculosis and {not indicated/requested/declined:14582} Immunizations ***   Follow up in 1 year or sooner if indicated.    Maury Dus, MD [facility]

## 2023-04-13 ENCOUNTER — Ambulatory Visit: Payer: Medicaid Other | Admitting: Student

## 2023-04-22 ENCOUNTER — Encounter: Payer: Self-pay | Admitting: Physical Medicine & Rehabilitation

## 2023-04-22 ENCOUNTER — Encounter: Payer: MEDICAID | Attending: Physical Medicine & Rehabilitation | Admitting: Physical Medicine & Rehabilitation

## 2023-04-22 VITALS — BP 115/74 | HR 77 | Ht 72.0 in | Wt 285.2 lb

## 2023-04-22 DIAGNOSIS — M797 Fibromyalgia: Secondary | ICD-10-CM | POA: Diagnosis present

## 2023-04-22 DIAGNOSIS — M79602 Pain in left arm: Secondary | ICD-10-CM | POA: Diagnosis not present

## 2023-04-22 DIAGNOSIS — M79601 Pain in right arm: Secondary | ICD-10-CM | POA: Diagnosis not present

## 2023-04-22 MED ORDER — PREGABALIN 100 MG PO CAPS: 100.0000 mg | ORAL_CAPSULE | Freq: Two times a day (BID) | ORAL | 3 refills | Status: DC
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
## 2023-04-22 NOTE — Patient Instructions (Signed)
Increase lyrica to 100mg  twice a day at next refill Continue gradual increase in activity Will reorder Zynex Nexwave device

## 2023-05-09 ENCOUNTER — Ambulatory Visit: Payer: MEDICAID | Admitting: Student

## 2023-05-09 NOTE — Progress Notes (Deleted)
    SUBJECTIVE:   CHIEF COMPLAINT / HPI:   Derrick Carey  PERTINENT  PMH / PSH: ***  OBJECTIVE:   There were no vitals taken for this visit. ***   ASSESSMENT/PLAN:   No problem-specific Assessment & Plan notes found for this encounter.     Darral Dash, DO  [facility]    {    This will disappear when note is signed, click to select method of visit    :1}

## 2023-05-17 ENCOUNTER — Ambulatory Visit (INDEPENDENT_AMBULATORY_CARE_PROVIDER_SITE_OTHER): Payer: MEDICAID | Admitting: Student

## 2023-05-17 VITALS — BP 124/70 | HR 77 | Ht 72.0 in | Wt 283.0 lb

## 2023-05-17 DIAGNOSIS — Z6837 Body mass index (BMI) 37.0-37.9, adult: Secondary | ICD-10-CM

## 2023-05-17 DIAGNOSIS — Z131 Encounter for screening for diabetes mellitus: Secondary | ICD-10-CM | POA: Diagnosis not present

## 2023-05-17 DIAGNOSIS — F902 Attention-deficit hyperactivity disorder, combined type: Secondary | ICD-10-CM | POA: Diagnosis not present

## 2023-05-17 DIAGNOSIS — E66812 Obesity, class 2: Secondary | ICD-10-CM

## 2023-05-17 DIAGNOSIS — M542 Cervicalgia: Secondary | ICD-10-CM

## 2023-05-17 DIAGNOSIS — E669 Obesity, unspecified: Secondary | ICD-10-CM

## 2023-05-17 DIAGNOSIS — Z13220 Encounter for screening for lipoid disorders: Secondary | ICD-10-CM | POA: Diagnosis not present

## 2023-05-17 MED ORDER — ESCITALOPRAM OXALATE 10 MG PO TABS: 10.0000 mg | ORAL_TABLET | Freq: Every day | ORAL | 0 refills | Status: DC

## 2023-05-17 MED ORDER — METHYLPHENIDATE HCL 10 MG PO TABS: 10.0000 mg | ORAL_TABLET | Freq: Every morning | ORAL | 0 refills | Status: DC

## 2023-05-17 MED ORDER — PREGABALIN 100 MG PO CAPS: 100.0000 mg | ORAL_CAPSULE | Freq: Two times a day (BID) | ORAL | 3 refills | Status: DC

## 2023-05-17 MED ORDER — BACLOFEN 10 MG PO TABS: 10.0000 mg | ORAL_TABLET | Freq: Three times a day (TID) | ORAL | 0 refills | Status: DC | PRN

## 2023-05-17 NOTE — Patient Instructions (Addendum)
It was great meeting you today.  As we discussed, - I refilled your medications at your pharmacy. - We collected bloodwork today, if anything is abnormal, I will call you.   If you have any questions or concerns, please feel free to call the clinic.   Have a wonderful day,  Dr. Darral Dash [facility] [phone]

## 2023-05-17 NOTE — Assessment & Plan Note (Signed)
Chronic.  Normal strength and neurovascularly intact in upper extremities.  No red flags on exam or history. Encouraged following up with PM&R -Continue home stretches -No need for imaging at this time -Continue baclofen, heating pad, and Tylenol

## 2023-05-17 NOTE — Progress Notes (Signed)
    SUBJECTIVE:   CHIEF COMPLAINT / HPI:   Derrick Carey is a 32 year old male here for follow-up of depression, lipid screening and A1c.  Depression follow-up He continues take Lexapro 10 mg daily.   In the past, it was recommended that he increase Lexapro to 20 mg daily and start CBT which he declined.  Feels stable and does not desire increase in medication at this time.  Chronic neck pain Follows with PMNR He has a next wave device, but unsure how to use it Taking Lyrica twice daily, which helps some along with Baclofen.  Due to obesity, prior PCP encouraged lipid panel and A1c screening which he declined at the time but is amenable to obtaining today.  PERTINENT  PMH / PSH: Major depression, chronic pain (follows with PM&R), fibromyalgia  OBJECTIVE:   BP 124/70   Pulse 77   Ht 6' (1.829 m)   Wt 283 lb (128.4 kg)   SpO2 96%   BMI 38.38 kg/m   General: Pleasant, no distress CV: Regular rate rhythm Respiratory: Breathing normally on room air with no wheezing or crackles to auscultation MSK: No cervical tenderness to palpation, good strength bilaterally in upper extremities 5/5 and normal grip strength.  Negative Phalen's  ASSESSMENT/PLAN:   Obesity He is playing more video games that are geared toward fitness, encouraged him to continue doing this Collected lipid panel and A1c today  Neck pain Chronic.  Normal strength and neurovascularly intact in upper extremities.  No red flags on exam or history. Encouraged following up with PM&R -Continue home stretches -No need for imaging at this time -Continue baclofen, heating pad, and Tylenol     Darral Dash, DO [facility]

## 2023-05-17 NOTE — Assessment & Plan Note (Signed)
He is playing more video games that are geared toward fitness, encouraged him to continue doing this Collected lipid panel and A1c today

## 2023-06-02 ENCOUNTER — Telehealth: Payer: Self-pay

## 2023-06-02 NOTE — Telephone Encounter (Signed)
Received VM from mother regarding patient. She is asking when patient received last tetanus vaccination.   Per chart review, patient had last Tdap on 02/05/2022.  Mother reports that he stepped on nail this morning, small puncture.   Washed with soap and water and then placed neosporin with bandage.   Return precautions discussed.   Veronda Prude, RN

## 2023-06-27 ENCOUNTER — Other Ambulatory Visit: Payer: Self-pay | Admitting: Student

## 2023-06-30 ENCOUNTER — Encounter: Payer: Self-pay | Admitting: Physical Medicine & Rehabilitation

## 2023-06-30 ENCOUNTER — Encounter: Payer: MEDICAID | Attending: Physical Medicine & Rehabilitation | Admitting: Physical Medicine & Rehabilitation

## 2023-06-30 VITALS — BP 124/83 | HR 93 | Ht 72.0 in | Wt 285.4 lb

## 2023-06-30 DIAGNOSIS — M797 Fibromyalgia: Secondary | ICD-10-CM | POA: Insufficient documentation

## 2023-06-30 DIAGNOSIS — G8929 Other chronic pain: Secondary | ICD-10-CM | POA: Diagnosis present

## 2023-06-30 DIAGNOSIS — F32A Depression, unspecified: Secondary | ICD-10-CM | POA: Diagnosis present

## 2023-06-30 DIAGNOSIS — M79601 Pain in right arm: Secondary | ICD-10-CM | POA: Insufficient documentation

## 2023-06-30 DIAGNOSIS — M79602 Pain in left arm: Secondary | ICD-10-CM | POA: Diagnosis present

## 2023-06-30 DIAGNOSIS — M546 Pain in thoracic spine: Secondary | ICD-10-CM | POA: Insufficient documentation

## 2023-06-30 DIAGNOSIS — M542 Cervicalgia: Secondary | ICD-10-CM | POA: Insufficient documentation

## 2023-06-30 MED ORDER — PREGABALIN 150 MG PO CAPS: 150.0000 mg | ORAL_CAPSULE | Freq: Two times a day (BID) | ORAL | 3 refills | Status: DC

## 2023-06-30 MED ORDER — DICLOFENAC SODIUM 1 % EX GEL: 4.0000 g | Freq: Four times a day (QID) | CUTANEOUS | 3 refills | Status: AC
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
## 2023-07-28 ENCOUNTER — Encounter: Payer: Self-pay | Admitting: Physical Medicine & Rehabilitation

## 2023-07-28 ENCOUNTER — Encounter: Payer: MEDICAID | Attending: Physical Medicine & Rehabilitation | Admitting: Physical Medicine & Rehabilitation

## 2023-07-28 VITALS — BP 116/80 | HR 69 | Ht 72.0 in | Wt 282.0 lb

## 2023-07-28 DIAGNOSIS — F32A Depression, unspecified: Secondary | ICD-10-CM | POA: Insufficient documentation

## 2023-07-28 DIAGNOSIS — M797 Fibromyalgia: Secondary | ICD-10-CM | POA: Insufficient documentation

## 2023-07-28 MED ORDER — DULOXETINE HCL 30 MG PO CPEP: 30.0000 mg | ORAL_CAPSULE | Freq: Every day | ORAL | 2 refills | Status: DC
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
## 2023-08-18 ENCOUNTER — Encounter: Payer: Self-pay | Admitting: Family Medicine

## 2023-08-18 ENCOUNTER — Ambulatory Visit (INDEPENDENT_AMBULATORY_CARE_PROVIDER_SITE_OTHER): Payer: MEDICAID | Admitting: Family Medicine

## 2023-08-18 VITALS — BP 118/80 | HR 75 | Ht 72.0 in | Wt 280.2 lb

## 2023-08-18 DIAGNOSIS — J329 Chronic sinusitis, unspecified: Secondary | ICD-10-CM | POA: Diagnosis not present

## 2023-08-18 DIAGNOSIS — F339 Major depressive disorder, recurrent, unspecified: Secondary | ICD-10-CM

## 2023-08-18 MED ORDER — AMOXICILLIN-POT CLAVULANATE 875-125 MG PO TABS: 1.0000 | ORAL_TABLET | Freq: Two times a day (BID) | ORAL | 0 refills | Status: DC

## 2023-08-18 NOTE — Patient Instructions (Signed)
Sent in augmentin for sinus infection Hopefully this will help you feel better  Follow up with Dr. Melissa Noon for your mood. There is room to go up on the dose of your cymbalta.  Be well, Dr. Pollie Meyer    Support in a Crisis What if I or someone I know is in crisis?  If you are thinking about harming yourself or having thoughts of suicide, or if you know someone who is, seek help right away. Call your doctor or mental health care provider. Call 911 or go to a hospital emergency room to get immediate help, or ask a friend or family member to help you do these things. Call the [place] National Suicide Prevention Lifeline's toll-free, 24-hour hotline at 1-800-273-TALK (912)526-5674) or TTY: 1-800-799-4 TTY 220-166-6474) to talk to a trained counselor. If you are in crisis, make sure you are not left alone.  If someone else is in crisis, make sure he or she is not left alone  24 Hour Availability [facility]  [address] , [place], [place] [address]  [phone]  For Crisis: [phone]   [facility] [employer] (Domestic Violence, Rape & Victim Assistance [phone]  [facility]  [address], [place]  [address]               [phone] or [phone]  [facility]    (ONLY from 8am-4pm)    [phone]  [facility] (24/7)   [phone]  [place] National Suicide Hotline   2046839528 (403)318-2952)    Psychiatry Resource List (Adults and Children) Most of these providers will take Medicaid. please consult your insurance for a complete and updated list of available providers. When calling to make an appointment have your insurance information available to confirm you are covered.   [facility]Psychiatry and Counseling [address] [place], [place] [address] [phone]  [facility]  [address] [place],  [place] [phone] Crisis [phone]   [facility]:   [facility]: [address]     [phone]   [facility]: [address] [place]. [place],        [phone] [place]: [phone],    [phone] [place]: [phone] [address],                   [phone] Children: [facility] [address]         [phone]  [facility] (virtual only) [phone]   [facility]  (Psychiatry only; Adults /children 12 and over, will take Medicaid)  [address] Laurell Josephs [address], [place], [place] [address]       [phone]   [facility] (Psychiatry & counseling ; adults & children ; will take Medicaid [address]  [place] [address]  Go on-line to complete referral ( https://www.[email or web address].org/en/make-[email or web address]-referral 6397974567    (Spanish speaking therapists)  [facility]  Psychiatry & counseling; Adults and children;  Call Registration prior to scheduling an appointment [phone] [address]    [place], [place] [address]    [phone]  [facility] (Psychiatry & counseling; adults & children; Medicare no Medicaid)  [address]   [place], [place]  [address]      [phone]    [facility] (up to  age 22)  Psychiatry & counseling ,will take Medicaid, must do counseling to receive psychiatry services  [address] [place] [address]        [phone]  [facility] (Psychiatry & counseling; adults & children; will take Medicaid) Will need a referral from provider [address],  [place], [place]  [phone]   [facility] --- Walk-In Mon-Friday 8am-3pm ( will take Medicaid, Psychiatry, Adults & children,  [address] [place], [place]   [phone]   [facility] [place], Walk-in M-F 8am-12pm and 1pm -3pm   (Counseling,  Psychiatry, will take Medicaid, adults & children)  [address] [place], [place]  [phone]

## 2023-08-18 NOTE — Progress Notes (Signed)
  Date of Visit: 08/18/2023   SUBJECTIVE:   HPI:  Derrick Carey presents today for a same day appointment to discuss ear pain and dizziness.  Ear pain: Began 3 to 4 days ago.  Feels disequilibrium, like he is on a boat.  Has pain in both ears.  Feels like his sinuses are "on fire".  Has headache and facial pain as well.  Reports history of this in the past, was diagnosed in the past with sinus infection when he had similar symptoms.  Eating and drinking normally.  Depression: Previously was taking escitalopram for mood and found it helpful, but had to stop this while adjusting his pain medications.  He currently takes Lyrica 150 mg twice daily, Cymbalta 30 mg daily, and baclofen 10 mg 4 times a day as needed for fibromyalgia pain.  He admits to passive thoughts of hurting himself and occasionally others.  The thoughts of hurting himself occur more than the thoughts of hurting others.  When he gets the thoughts, he is able to push them out of his head.  Denies any plan or intention to act on these thoughts.  Reports he has dealt with these thoughts for quite some time.  He does have a supportive family who he can reach out to if the thoughts were to worsen.  He is also aware of the suicide hotline and states he would call it if needed.   PMHx: history of allergic rhinitis, ADHD, obesity, depression  OBJECTIVE:   BP 118/80   Pulse 75   Ht 6' (1.829 m)   Wt 280 lb 3.2 oz (127.1 kg)   SpO2 97%   BMI 38.00 kg/m  Gen: No acute distress, pleasant, cooperative HEENT: Normocephalic, atraumatic, TMs clear bilaterally, oropharynx clear and moist, nares patent, mild sinus tenderness to palpation, No anterior cervical or supraclavicular lymphadenopathy.  Heart: Regular rate and rhythm, no murmur Lungs: Clear bilaterally, normal effort Neuro: Alert, grossly nonfocal, speech normal Psych: normal range of affect, well groomed, speech normal in rate and volume, normal eye contact   ASSESSMENT/PLAN:    Assessment & Plan Sinusitis, unspecified chronicity, unspecified location Symptoms consistent with acute sinusitis with some resultant disequilibrium..  Prescribed Augmentin.  Follow-up if not improving. Major depression, recurrent, chronic (HCC) PHQ-9 positive for passive suicidality, also admits to passive occasional thoughts of harming others.  Discussed at length with patient.  He denies any plans or intentions of harming himself or anyone else and is able to push these thoughts out of his head when they occur.  Discussed availability of suicide hotline 48, patient agrees that he would call this if needed.  Also discussed that there is room to go up on the dose of his Cymbalta, which may help his mood.  He prefers to wait and not make any changes to his meds right now as the Cymbalta was recently added by his pain doctor.  Scheduled follow-up with PCP to check-in and discuss in more detail.  Also provided list of psychiatry offices that accept Medicaid.    [place] J. Pollie Meyer, MD [facility]

## 2023-08-20 NOTE — Assessment & Plan Note (Signed)
PHQ-9 positive for passive suicidality, also admits to passive occasional thoughts of harming others.  Discussed at length with patient.  He denies any plans or intentions of harming himself or anyone else and is able to push these thoughts out of his head when they occur.  Discussed availability of suicide hotline 19, patient agrees that he would call this if needed.  Also discussed that there is room to go up on the dose of his Cymbalta, which may help his mood.  He prefers to wait and not make any changes to his meds right now as the Cymbalta was recently added by his pain doctor.  Scheduled follow-up with PCP to check-in and discuss in more detail.  Also provided list of psychiatry offices that accept Medicaid.

## 2023-08-29 ENCOUNTER — Encounter: Payer: Self-pay | Admitting: Physical Medicine & Rehabilitation

## 2023-08-29 ENCOUNTER — Encounter: Payer: MEDICAID | Attending: Physical Medicine & Rehabilitation | Admitting: Physical Medicine & Rehabilitation

## 2023-08-29 VITALS — BP 119/79 | HR 76 | Ht 72.0 in | Wt 276.0 lb

## 2023-08-29 DIAGNOSIS — M797 Fibromyalgia: Secondary | ICD-10-CM | POA: Insufficient documentation

## 2023-08-29 DIAGNOSIS — F32A Depression, unspecified: Secondary | ICD-10-CM | POA: Diagnosis present

## 2023-08-29 MED ORDER — PREGABALIN 100 MG PO CAPS: 100.0000 mg | ORAL_CAPSULE | Freq: Two times a day (BID) | ORAL | 0 refills | Status: DC

## 2023-08-29 MED ORDER — PREGABALIN 150 MG PO CAPS: 150.0000 mg | ORAL_CAPSULE | Freq: Two times a day (BID) | ORAL | 3 refills | Status: DC

## 2023-08-29 MED ORDER — DULOXETINE HCL 60 MG PO CPEP: 60.0000 mg | ORAL_CAPSULE | Freq: Every day | ORAL | 2 refills | Status: DC
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
## 2023-08-30 ENCOUNTER — Ambulatory Visit (INDEPENDENT_AMBULATORY_CARE_PROVIDER_SITE_OTHER): Payer: MEDICAID | Admitting: Student

## 2023-08-30 ENCOUNTER — Encounter: Payer: Self-pay | Admitting: Student

## 2023-08-30 VITALS — BP 118/75 | HR 73 | Wt 279.8 lb

## 2023-08-30 DIAGNOSIS — Z6837 Body mass index (BMI) 37.0-37.9, adult: Secondary | ICD-10-CM | POA: Diagnosis not present

## 2023-08-30 DIAGNOSIS — J069 Acute upper respiratory infection, unspecified: Secondary | ICD-10-CM

## 2023-08-30 DIAGNOSIS — J029 Acute pharyngitis, unspecified: Secondary | ICD-10-CM | POA: Insufficient documentation

## 2023-08-30 DIAGNOSIS — E66812 Obesity, class 2: Secondary | ICD-10-CM

## 2023-08-30 DIAGNOSIS — R45851 Suicidal ideations: Secondary | ICD-10-CM

## 2023-08-30 LAB — POCT GLYCOSYLATED HEMOGLOBIN (HGB A1C): Hemoglobin A1C: 5 % (ref 4.0–5.6)

## 2023-08-30 NOTE — Patient Instructions (Addendum)
It was great seeing you today.  As we discussed, -Continue taking your medications as prescribed. -Hydrate and rest while you feel unwell  We collected blood work today, if anything is abnormal I will call you   if you have any questions or concerns, please feel free to call the clinic.   Have a wonderful day,  Dr. Darral Dash [facility] [phone]
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
## 2023-08-30 NOTE — Assessment & Plan Note (Signed)
With associated fatigue, chills. No concern for bacterial infection at this time such as pneumonia, strep pharyngitis, etc. Reasonable to test for mono, obtain EBV today. Also collect CBC.

## 2023-08-30 NOTE — Assessment & Plan Note (Signed)
Chronic for many years. No acute concerns or active ideations.  No desire to harm himself or others at this time. No changes to medications today. Encouraged him to establish care with therapist, provided with Medicaid therapy resources.

## 2023-08-30 NOTE — Assessment & Plan Note (Signed)
A1c today. Encouraged physical activity

## 2023-08-31 LAB — CBC WITH DIFFERENTIAL/PLATELET
Basophils Absolute: 0 10*3/uL (ref 0.0–0.2)
Basos: 0 %
EOS (ABSOLUTE): 0.1 10*3/uL (ref 0.0–0.4)
Eos: 2 %
Hematocrit: 47.1 % (ref 37.5–51.0)
Hemoglobin: 15.5 g/dL (ref 13.0–17.7)
Immature Grans (Abs): 0 10*3/uL (ref 0.0–0.1)
Immature Granulocytes: 0 %
Lymphocytes Absolute: 1.4 10*3/uL (ref 0.7–3.1)
Lymphs: 20 %
MCH: 28.8 pg (ref 26.6–33.0)
MCHC: 32.9 g/dL (ref 31.5–35.7)
MCV: 88 fL (ref 79–97)
Monocytes Absolute: 0.5 10*3/uL (ref 0.1–0.9)
Monocytes: 7 %
Neutrophils Absolute: 5 10*3/uL (ref 1.4–7.0)
Neutrophils: 71 %
Platelets: 184 10*3/uL (ref 150–450)
RBC: 5.38 x10E6/uL (ref 4.14–5.80)
RDW: 12 % (ref 11.6–15.4)
WBC: 7.1 10*3/uL (ref 3.4–10.8)

## 2023-08-31 LAB — EPSTEIN BARR VRS(EBV DNA BY PCR): EBV DNA, Quant PCR, Plasma: NEGATIVE [IU]/mL

## 2023-09-09 ENCOUNTER — Encounter: Payer: Self-pay | Admitting: Student

## 2023-09-26 ENCOUNTER — Encounter: Payer: MEDICAID | Attending: Physical Medicine & Rehabilitation | Admitting: Physical Medicine & Rehabilitation

## 2023-09-26 DIAGNOSIS — M797 Fibromyalgia: Secondary | ICD-10-CM | POA: Insufficient documentation

## 2023-09-26 DIAGNOSIS — F32A Depression, unspecified: Secondary | ICD-10-CM | POA: Insufficient documentation

## 2023-09-30 ENCOUNTER — Encounter: Payer: Self-pay | Admitting: Physical Medicine & Rehabilitation

## 2023-09-30 ENCOUNTER — Encounter (HOSPITAL_BASED_OUTPATIENT_CLINIC_OR_DEPARTMENT_OTHER): Payer: MEDICAID | Admitting: Physical Medicine & Rehabilitation

## 2023-09-30 VITALS — BP 113/68 | HR 97 | Ht 72.0 in | Wt 277.0 lb

## 2023-09-30 DIAGNOSIS — F32A Depression, unspecified: Secondary | ICD-10-CM | POA: Diagnosis present

## 2023-09-30 DIAGNOSIS — M797 Fibromyalgia: Secondary | ICD-10-CM

## 2023-09-30 MED ORDER — PREGABALIN 150 MG PO CAPS: 150.0000 mg | ORAL_CAPSULE | Freq: Three times a day (TID) | ORAL | 3 refills | Status: DC

## 2023-09-30 MED ORDER — CYCLOBENZAPRINE HCL 5 MG PO TABS: 5.0000 mg | ORAL_TABLET | Freq: Every day | ORAL | 2 refills | Status: DC
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
## 2023-10-28 ENCOUNTER — Encounter: Payer: MEDICAID | Admitting: Physical Medicine & Rehabilitation

## 2023-11-04 ENCOUNTER — Encounter: Payer: Self-pay | Admitting: Physical Medicine & Rehabilitation

## 2023-11-04 ENCOUNTER — Encounter: Payer: MEDICAID | Attending: Physical Medicine & Rehabilitation | Admitting: Physical Medicine & Rehabilitation

## 2023-11-04 VITALS — BP 133/78 | HR 83 | Ht 72.0 in | Wt 286.0 lb

## 2023-11-04 DIAGNOSIS — F32A Depression, unspecified: Secondary | ICD-10-CM | POA: Diagnosis present

## 2023-11-04 DIAGNOSIS — M797 Fibromyalgia: Secondary | ICD-10-CM | POA: Diagnosis not present
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
## 2023-12-02 ENCOUNTER — Encounter: Payer: Self-pay | Admitting: Physical Medicine & Rehabilitation

## 2023-12-02 ENCOUNTER — Encounter: Payer: MEDICAID | Attending: Physical Medicine & Rehabilitation | Admitting: Physical Medicine & Rehabilitation

## 2023-12-02 VITALS — BP 136/81 | HR 91 | Ht 72.0 in | Wt 280.0 lb

## 2023-12-02 DIAGNOSIS — M797 Fibromyalgia: Secondary | ICD-10-CM | POA: Diagnosis not present

## 2023-12-02 DIAGNOSIS — F32A Depression, unspecified: Secondary | ICD-10-CM

## 2023-12-02 MED ORDER — PREGABALIN 100 MG PO CAPS: 100.0000 mg | ORAL_CAPSULE | Freq: Two times a day (BID) | ORAL | 3 refills | Status: DC
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
## 2023-12-09 ENCOUNTER — Ambulatory Visit: Payer: MEDICAID | Admitting: Student

## 2023-12-12 ENCOUNTER — Ambulatory Visit: Payer: MEDICAID | Admitting: Student

## 2023-12-14 ENCOUNTER — Encounter: Payer: Self-pay | Admitting: Student

## 2023-12-14 ENCOUNTER — Ambulatory Visit (INDEPENDENT_AMBULATORY_CARE_PROVIDER_SITE_OTHER): Payer: MEDICAID | Admitting: Student

## 2023-12-14 VITALS — BP 147/110 | HR 85 | Ht 72.0 in | Wt 279.8 lb

## 2023-12-14 DIAGNOSIS — E66812 Obesity, class 2: Secondary | ICD-10-CM

## 2023-12-14 DIAGNOSIS — R030 Elevated blood-pressure reading, without diagnosis of hypertension: Secondary | ICD-10-CM | POA: Diagnosis not present

## 2023-12-14 DIAGNOSIS — Z0000 Encounter for general adult medical examination without abnormal findings: Secondary | ICD-10-CM | POA: Diagnosis not present

## 2023-12-14 DIAGNOSIS — R45851 Suicidal ideations: Secondary | ICD-10-CM

## 2023-12-14 DIAGNOSIS — Z6837 Body mass index (BMI) 37.0-37.9, adult: Secondary | ICD-10-CM

## 2023-12-14 MED ORDER — LISINOPRIL 20 MG PO TABS: 20.0000 mg | ORAL_TABLET | Freq: Every day | ORAL | 3 refills | Status: AC

## 2023-12-14 NOTE — Progress Notes (Signed)
    SUBJECTIVE:   CHIEF COMPLAINT / HPI:   33 year old male history of ADHD, morbid obesity and depression.  Presenting today for annual wellness visits.  Use alcohol occasionally about once every few months. Smoked for a short period but quit years ago. No illicit drug use.   Medication: Baclofen , Lyrica. Cymbalta   Medical concerns:  Mood Reports intermittent "emotional blackout" which started since he received COVID vaccine in 2020.  When he gets upset he gets sharp headache on both sides that last few seconds. No associated vision changes, or extremity weakness.  [place] Spaniel this is consistent with symptoms he's read online about common side effects of COVID  PERTINENT  PMH / PSH: Reviewed   OBJECTIVE:   BP (!) 147/110   Pulse 85   Ht 6' (1.829 m)   Wt 279 lb 12.8 oz (126.9 kg)   SpO2 98%   BMI 37.95 kg/m     Physical Exam General: Alert, morbidly obese, NAD Cardiovascular: RRR, No Murmurs, Normal S2/S2 Respiratory: CTAB, No wheezing or Rales Abdomen: No distension or tenderness Extremities: No edema on extremities   Skin: Warm and dry Psych: Normal affect, good eye contact  ASSESSMENT/PLAN:   ADHD (attention deficit hyperactivity disorder) Expressed desire to restart his Ritalin.  Has been off his medication for months.  Discussed with patient needs to follow-up with PCP to discuss restarting his Ritalin and possibly will need EKG to check QTc prior to restarting medication.  Patient verbalized understanding and agreeable to plan to follow-up with PCP in a week .  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  No acute  concern or active plan.  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 Suspect this could be related to his self described emotional blackout.  Provided patient with information to get to [facility] and additional resources including crisis line.  Elevated BP Patient presenting today with elevated BP x 2.  Denies any headache, vision changes, chest pain or shortness of breath. -Started patient on lisinopril 20 mg daily -Encouraged to check BP daily to keep a BP log -Will follow-up with PCP in a week to assess response to medication and BMP check  Patient to follow-up with PCP in a week to ensure he follow up with [facility], consider restarting Reglan and BP monitoring.    Jerre Simon, MD [facility]

## 2023-12-14 NOTE — Assessment & Plan Note (Addendum)
 Expressed desire to restart his Ritalin.  Has been off his medication for months.  Discussed with patient needs to follow-up with PCP to discuss restarting his Ritalin and possibly will need EKG to check QTc prior to restarting medication.  Patient verbalized understanding and agreeable to plan to follow-up with PCP in a week .

## 2023-12-14 NOTE — Assessment & Plan Note (Signed)
 This is chronic for patient.  No acute  concern or active plan.  Given his reports of chronic psychosis that are stable including visual hallucinations of insects and auditory hallucination which is chronic for patient emphasized to patient the need to follow up with [facility] today or first thing tomorrow morning. Suspect this could be related to his self described emotional blackout. Provided patient with information to get to [facility] and additional resources including crisis line.

## 2023-12-14 NOTE — Patient Instructions (Addendum)
  PLEASE GO HERE TODAY:  24 Hour Availability for Walk-IN services  [facility]  [address] [place], [place] [phone] Crisis [phone]   If you are feeling suicidal or depression symptoms worsen please immediately go to:   If you are thinking about harming yourself or having thoughts of suicide, or if you know someone who is, seek help right away. If you are in crisis, make sure you are not left alone.  If someone else is in crisis, make sure he/she/they is not left alone  Other crisis resources:  [facility] [employer] (Domestic Violence, Rape & Victim Assistance [phone]  [facility]    (ONLY from 8am-4pm)    [phone]  [facility] (24/7)   [phone]  [place] National Suicide Hotline   [phone] Len Childs)   They ordered labs to check for your cholesterol level, blood count, thyroid, and knee function.  Your blood pressure today was elevated and I started you on the new blood pressure medication lisinopril which you will take 20 mg daily.  Please make sure to follow-up with your PCP in 1 week.  I also strongly recommend that you see the [facility] for assessment for your ongoing hallucinations and depression.  Also at your follow-up appointment please discuss with your PCP about restarting your [place]

## 2023-12-15 LAB — BASIC METABOLIC PANEL
BUN/Creatinine Ratio: 9 (ref 9–20)
BUN: 8 mg/dL (ref 6–20)
CO2: 21 mmol/L (ref 20–29)
Calcium: 9.5 mg/dL (ref 8.7–10.2)
Chloride: 101 mmol/L (ref 96–106)
Creatinine, Ser: 0.93 mg/dL (ref 0.76–1.27)
Glucose: 116 mg/dL — ABNORMAL HIGH (ref 70–99)
Potassium: 4.4 mmol/L (ref 3.5–5.2)
Sodium: 138 mmol/L (ref 134–144)
eGFR: 111 mL/min/{1.73_m2} (ref >59)

## 2023-12-15 LAB — CBC
Hematocrit: 47.3 % (ref 37.5–51.0)
Hemoglobin: 16.1 g/dL (ref 13.0–17.7)
MCH: 29.5 pg (ref 26.6–33.0)
MCHC: 34 g/dL (ref 31.5–35.7)
MCV: 87 fL (ref 79–97)
Platelets: 198 10*3/uL (ref 150–450)
RBC: 5.46 x10E6/uL (ref 4.14–5.80)
RDW: 12.2 % (ref 11.6–15.4)
WBC: 8.6 10*3/uL (ref 3.4–10.8)

## 2023-12-15 LAB — LIPID PANEL
Chol/HDL Ratio: 7.5 ratio — ABNORMAL HIGH (ref 0.0–5.0)
Cholesterol, Total: 211 mg/dL — ABNORMAL HIGH (ref 100–199)
HDL: 28 mg/dL — ABNORMAL LOW (ref >39)
LDL Chol Calc (NIH): 143 mg/dL — ABNORMAL HIGH (ref 0–99)
Triglycerides: 219 mg/dL — ABNORMAL HIGH (ref 0–149)
VLDL Cholesterol Cal: 40 mg/dL (ref 5–40)

## 2023-12-15 LAB — TSH: TSH: 1.25 u[IU]/mL (ref 0.450–4.500)

## 2023-12-16 ENCOUNTER — Telehealth: Payer: Self-pay | Admitting: Student

## 2023-12-16 NOTE — Telephone Encounter (Signed)
 Called patient's line to follow-up about last appointment in which he was supposed to follow-up with [facility].  First call phone was answered but dropped and follow-up call with no response and unable to leave a voicemail due to voicemail being full.

## 2023-12-17 ENCOUNTER — Telehealth: Payer: Self-pay | Admitting: Student

## 2023-12-17 NOTE — Telephone Encounter (Signed)
 Called patient number and unable to reach or leave a voicemail. Called his emergency contact Ms Dayquan Buys, patient Mom. Left a generic message to tell patient I was following up with recent visit and for update. Left a follow up number for patient to call.

## 2023-12-17 NOTE — Telephone Encounter (Signed)
**  After Hours/ Emergency Line Call**  Received a page to call [phone]) - [phone].  Patient: Derrick Carey  Caller: Self  Confirmed name & DOB of patient with caller  Subjective:  Patient attempted to call [facility] office after provider Dr. Elliot Gurney had called patient yesterday and left voicemail.  Discussed Dr. Elliot Gurney was following up with patient related to his mental health and whether or not he follow-up with [facility].  Patient reports he is not yet followed with [facility], but plans to call him on Monday.  He is otherwise well today.  Additionally he wanted to know his labs.  Discussed his lab results and his elevated cholesterol.  I informed patient I would forward this telephone encounter to Dr. Elliot Gurney so that Dr. Elliot Gurney can discuss further.  Objective:  Observations: NAD Respiratory: Speaking in full sentences  Assessment & Plan  Derrick Carey is a 33 y.o. male who calls after-hours line and attempt to get in touch with Dr. Elliot Gurney who left him a voicemail yesterday.  Recommendations:  Follow-up with [facility] as recommended by Dr, Elliot Gurney Discuss lipid panel with PCP or Dr. Elliot Gurney during regular office hours  -- Red flags discussed.   -- Will forward to PCP.  Tiffany Kocher, DO [facility], PGY-1

## 2023-12-27 ENCOUNTER — Ambulatory Visit: Payer: MEDICAID | Admitting: Student

## 2024-01-10 ENCOUNTER — Ambulatory Visit (INDEPENDENT_AMBULATORY_CARE_PROVIDER_SITE_OTHER): Payer: MEDICAID | Admitting: Student

## 2024-01-10 ENCOUNTER — Encounter: Payer: Self-pay | Admitting: Student

## 2024-01-10 VITALS — BP 120/75 | HR 79 | Ht 69.0 in | Wt 278.6 lb

## 2024-01-10 DIAGNOSIS — F902 Attention-deficit hyperactivity disorder, combined type: Secondary | ICD-10-CM

## 2024-01-10 DIAGNOSIS — F339 Major depressive disorder, recurrent, unspecified: Secondary | ICD-10-CM

## 2024-01-10 DIAGNOSIS — R45851 Suicidal ideations: Secondary | ICD-10-CM | POA: Diagnosis not present

## 2024-01-10 NOTE — Progress Notes (Signed)
    SUBJECTIVE:   CHIEF COMPLAINT / HPI:   33 year old man with history of obesity, ADHD, and passive suicidal ideation presenting today for medication refill.  Patient will like to restart on his methylphenidate medication.  Previously on 10 mg daily and said his last dose was about 5-6 months ago and at the time had not refilled his medication due to finances.  Reporting good tolerance with the medication, no overt weight loss, insomnia or decreased appetite on the medication.  Dated he does have occasional headaches but attributed to the medication as he has history of migraines.  These headaches are usually present with or without the medication.  He denies any headaches.  PERTINENT  PMH / PSH: Reviewed  OBJECTIVE:   BP 120/75   Pulse 79   Ht 5\' 9"  (1.753 m)   Wt 278 lb 9.6 oz (126.4 kg)   SpO2 98%   BMI 41.14 kg/m    Physical Exam General: Alert, morbidly obese, NAD Cardiovascular: RRR, No Murmurs, Normal S2/S2 Respiratory: CTAB, No wheezing or Rales Abdomen: No distension or tenderness Extremities: No edema on extremities   Psych: Pleasant affect and appropriate judgment  ASSESSMENT/PLAN:   ADHD (attention deficit hyperactivity disorder) ADHD diagnosed in third grade.  Had long discussion with patient and given his reports of psychosis with visual hallucination and active suicidality patient is at very high risk for adverse reaction to stimulant. Informed patient given with his comorbidities including his passive suicidality he would benefit from management with a specialist/psychiatrist for close monitoring.  Patient voiced understanding and agreeable to plan.  Referral to psychiatry placed.     Jerre Simon, MD [facility]

## 2024-01-10 NOTE — Patient Instructions (Signed)
 Pleasure to see you today.  I feel like given your history of passive suicidality and hallucination we will need a closer monitoring on this medication as it could aggravate your symptoms.  I have placed referral to psychiatry.

## 2024-01-10 NOTE — Assessment & Plan Note (Signed)
 ADHD diagnosed in third grade.  Had long discussion with patient and given his reports of psychosis with visual hallucination and active suicidality patient is at very high risk for adverse reaction to stimulant. Informed patient given with his comorbidities including his passive suicidality he would benefit from management with a specialist/psychiatrist for close monitoring.  Patient voiced understanding and agreeable to plan.  Referral to psychiatry placed.

## 2024-01-13 ENCOUNTER — Encounter: Payer: MEDICAID | Attending: Physical Medicine & Rehabilitation | Admitting: Physical Medicine & Rehabilitation

## 2024-01-13 ENCOUNTER — Encounter: Payer: Self-pay | Admitting: Physical Medicine & Rehabilitation

## 2024-01-13 VITALS — BP 102/67 | HR 92 | Ht 69.0 in | Wt 275.6 lb

## 2024-01-13 DIAGNOSIS — F32A Depression, unspecified: Secondary | ICD-10-CM | POA: Diagnosis not present

## 2024-01-13 DIAGNOSIS — M797 Fibromyalgia: Secondary | ICD-10-CM | POA: Diagnosis not present

## 2024-01-13 MED ORDER — DULOXETINE HCL 30 MG PO CPEP: 30.0000 mg | ORAL_CAPSULE | Freq: Every day | ORAL | 2 refills | Status: DC

## 2024-01-13 MED ORDER — DULOXETINE HCL 60 MG PO CPEP: 60.0000 mg | ORAL_CAPSULE | Freq: Every day | ORAL | 2 refills | Status: DC

## 2024-01-13 NOTE — Progress Notes (Deleted)
 Pain Inventory Average Pain 8 Pain Right Now 9 My pain is constant, sharp, burning, and aching  In the last 24 hours, has pain interfered with the following? General activity 7 Relation with others 7 Enjoyment of life 8 What TIME of day is your pain at its worst? morning  and evening Sleep (in general) Fair  Pain is worse with: bending and lifting Pain improves with: rest, heat/ice, and medication Relief from Meds: 5  Family History  Problem Relation Age of Onset   Hyperlipidemia Mother    Hypertension Mother    Congenital heart disease Father    Heart attack Father    Arthritis Father    Liver disease Father        Fatty Liver   Hypertension Father    Congestive Heart Failure Father    Heart disease Father        Heart Attacks 3   Social History   Socioeconomic History   Marital status: Single    Spouse name: Not on file   Number of children: 0   Years of education: Not on file   Highest education level: Not on file  Occupational History   Not on file  Tobacco Use   Smoking status: Former    Current packs/day: 0.50    Average packs/day: 0.5 packs/day for 1 year (0.5 ttl pk-yrs)    Types: Cigarettes    Passive exposure: Past   Smokeless tobacco: Never  Vaping Use   Vaping status: Never Used  Substance and Sexual Activity   Alcohol use: Yes    Comment: occasional   Drug use: No   Sexual activity: Not on file  Other Topics Concern   Not on file  Social History Narrative   [employer] - Lives at home with Mom and Dad         Right Handed    Lives in a one story home    Social Drivers of Health   Financial Resource Strain: Not on file  Food Insecurity: Food Insecurity Present (12/14/2023)   Hunger Vital Sign    Worried About Running Out of Food in the Last Year: Sometimes true    Ran Out of Food in the Last Year: Never true  Transportation Needs: Not on file  Physical Activity: Not on file  Stress: Not on file  Social Connections: Not on  file   Past Surgical History:  Procedure Laterality Date   LACERATION REPAIR     in elementary school X 3   Past Surgical History:  Procedure Laterality Date   LACERATION REPAIR     in elementary school X 3   Past Medical History:  Diagnosis Date   ADHD (attention deficit hyperactivity disorder)    Asperger syndrome    Chronic cough 08/12/2011   Onset ? 06/2019 - ENT eval per [facility] neg 11/15/19 included neg sinus ct -Allergy profile  05/19/20  >  Eos 0.2 /  IgE  15  - 05/19/2020 some better p max gerd/ 1st gen H1 blockers per guidelines  > add gabapentin 100 tid  - 07/14/2020 increased gabapentin to 300 mg tid and  h1 p supper and hs only  (otherwise ? Make him too sleepy in daytime)    Dry skin 02/08/2012   1 mth hx of dry skin in back of right ear.   Allergic dermatitis vs contact dermatitis vs eczma   GERD (gastroesophageal reflux disease)    Lateral epicondylitis of right elbow 06/14/2018  Papule of skin 06/19/2015   Right arm pain 08/01/2018   Sinusitis, acute maxillary 11/26/2013   BP 102/67 (BP Location: Left Arm, Patient Position: Sitting, Cuff Size: Normal)   Pulse 92   Ht 5\' 9"  (1.753 m)   Wt 275 lb 9.6 oz (125 kg)   SpO2 93%   BMI 40.70 kg/m   Opioid Risk Score:   Fall Risk Score:  `1  Depression screen [facility] 2/9     01/10/2024    2:57 PM 12/14/2023   10:58 AM 09/30/2023    2:22 PM 08/29/2023    1:43 PM 08/18/2023   10:48 AM 07/28/2023    2:00 PM 06/30/2023    1:45 PM  Depression screen PHQ 2/9  Decreased Interest 1 2 1  0 2 0 0  Down, Depressed, Hopeless 1 2 1  0 1  0  PHQ - 2 Score 2 4 2  0 3 0 0  Altered sleeping 1 1   2     Tired, decreased energy 1 2   2     Change in appetite 0 1   0    Feeling bad or failure about yourself  1 2   2     Trouble concentrating 1 1   1     Moving slowly or fidgety/restless 0 3   2    Suicidal thoughts 0    1    PHQ-9 Score 6 14   13     3.
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
## 2024-01-24 NOTE — Therapy (Deleted)
 OUTPATIENT PHYSICAL THERAPY UPPER EXTREMITY EVALUATION   Patient Name: Derrick Carey MRN: [id number] DOB:November 27, 1990, 33 y.o., male Today's Date: 01/24/2024  END OF SESSION:   Past Medical History:  Diagnosis Date   ADHD (attention deficit hyperactivity disorder)    Asperger syndrome    Chronic cough 08/12/2011   Onset ? 06/2019 - ENT eval per [facility] neg 11/15/19 included neg sinus ct -Allergy profile  05/19/20  >  Eos 0.2 /  IgE  15  - 05/19/2020 some better p max gerd/ 1st gen H1 blockers per guidelines  > add gabapentin 100 tid  - 07/14/2020 increased gabapentin to 300 mg tid and  h1 p supper and hs only  (otherwise ? Make him too sleepy in daytime)    Dry skin 02/08/2012   1 mth hx of dry skin in back of right ear.   Allergic dermatitis vs contact dermatitis vs eczma   GERD (gastroesophageal reflux disease)    Lateral epicondylitis of right elbow 06/14/2018   Papule of skin 06/19/2015   Right arm pain 08/01/2018   Sinusitis, acute maxillary 11/26/2013   Past Surgical History:  Procedure Laterality Date   LACERATION REPAIR     in elementary school X 3   Patient Active Problem List   Diagnosis Date Noted   Sore throat 08/30/2023   Other chronic pain 03/19/2023   Passive suicidal ideations 08/10/2022   Major depression, recurrent, chronic (HCC) 07/19/2022   Food insecurity 04/23/2022   Neck pain 02/05/2022   Bilateral arm pain 02/05/2022   Obesity 07/23/2015   Allergic rhinitis 06/05/2015   ADHD (attention deficit hyperactivity disorder) 12/15/2006    PCP: ***  REFERRING PROVIDER: ***  REFERRING DIAG: ***  Rationale for Evaluation and Treatment: Rehabilitation  THERAPY DIAG:  No diagnosis found.  PERTINENT HISTORY: ***  WEIGHT BEARING RESTRICTIONS: {Yes ***/No:24003}  FALLS:  Has patient fallen in last 6 months? {fallsyesno:27318}  LIVING ENVIRONMENT: Lives with: {[facility] lives with:25569::"lives with their family"} Lives in: {Lives in:25570} Stairs:  {opstairs:27293} Has following equipment at home: {Assistive devices:23999}  OCCUPATION: ***   PRECAUTIONS: {Therapy precautions:24002} ---------------------------------------------------------------------------------------------  SUBJECTIVE:                                                                                                                                                                                      SUBJECTIVE STATEMENT: Eval statement 01/24/2024: *** Hand dominance: {MISC; OT HAND DOMINANCE:[phone]}  RED FLAGS: {PT Red Flags:29287}   PLOF: {PLOF:24004}  PATIENT GOALS: ***  NEXT MD VISIT: *** ---------------------------------------------------------------------------------------------  OBJECTIVE:  Note: Objective measures were completed at Evaluation unless otherwise noted.  DIAGNOSTIC FINDINGS:  ***  PATIENT SURVEYS :  {  rehab surveys:24030:a}  COGNITION: Overall cognitive status: {cognition:24006}     SENSATION: {sensation:27233}  POSTURE: ***  UPPER EXTREMITY ROM:   {AROM/PROM:27142} ROM Right eval Left eval  Shoulder flexion    Shoulder extension    Shoulder abduction    Shoulder adduction    Shoulder internal rotation    Shoulder external rotation    Elbow flexion    Elbow extension    Wrist flexion    Wrist extension    Wrist ulnar deviation    Wrist radial deviation    Wrist pronation    Wrist supination    (Blank rows = not tested)  UPPER EXTREMITY MMT:  MMT Right eval Left eval  Shoulder flexion    Shoulder extension    Shoulder abduction    Shoulder adduction    Shoulder internal rotation    Shoulder external rotation    Middle trapezius    Lower trapezius    Elbow flexion    Elbow extension    Wrist flexion    Wrist extension    Wrist ulnar deviation    Wrist radial deviation    Wrist pronation    Wrist supination    Grip strength (lbs)    (Blank rows = not tested)  SHOULDER SPECIAL  TESTS: Impingement tests: {shoulder impingement test:25231:a} SLAP lesions: {SLAP lesions:25232} Instability tests: {shoulder instability test:25233} Rotator cuff assessment: {rotator cuff assessment:25234} Biceps assessment: {biceps assessment:25235}  JOINT MOBILITY TESTING:  ***  PALPATION:  ***                                             [facility] Adult PT Treatment:                                                DATE: 01/24/2024  Therapeutic Exercise: *** Manual Therapy: *** Neuromuscular re-ed: *** Therapeutic Activity: *** Modalities: *** Self Care: ***   PATIENT EDUCATION: Education details: Pt received education regarding HEP performance, ADL performance, functional activity tolerance, impairment education, appropriate performance of therapeutic activities. Person educated: {Person educated:25204} Education method: {Education Method:25205} Education comprehension: {Education Comprehension:25206}  HOME EXERCISE PROGRAM: *** ---------------------------------------------------------------------------------------------  ASSESSMENT:  CLINICAL IMPRESSION: Eval impression (01/24/2024): Pt. attended today's physical therapy session for evaluation of ***. Pt has complaints of ***. Pt has notable deficits with ***.  Signs and symptoms are concurrent with ***. Pt would benefit from therapeutic focus on ***.  Treatment performed today focused on *** Pt demonstrated *** understanding of education provided. required *** cues and *** assistance for appropriate performance with today's activities.  Pt requires the intervention of skilled outpatient physical therapy to address the aforementioned deficits and progress towards a functional level in line with therapeutic goals.    OBJECTIVE IMPAIRMENTS: {opptimpairments:25111}.   ACTIVITY LIMITATIONS: {activitylimitations:27494}  PARTICIPATION LIMITATIONS: {participationrestrictions:25113}  PERSONAL FACTORS: {Personal factors:25162}  are also affecting patient's functional outcome.   REHAB POTENTIAL: {rehabpotential:25112}  CLINICAL DECISION MAKING: {clinical decision making:25114}  EVALUATION COMPLEXITY: {Evaluation complexity:25115}  GOALS: Goals reviewed with patient? Yes  SHORT TERM GOALS: Target date: ***  Pt will be independent with administered HEP to demonstrate the competency necessary for long term managemnet of symptoms at home.  Baseline: Goal status: {GOALSTATUS:25110}  2.  *** Baseline:  Goal status: {  GOALSTATUS:25110}  3.  *** Baseline:  Goal status: {GOALSTATUS:25110}  4.  *** Baseline:  Goal status: {GOALSTATUS:25110}  5.  *** Baseline:  Goal status: {GOALSTATUS:25110}  6.  *** Baseline:  Goal status: {GOALSTATUS:25110}  LONG TERM GOALS: Target date: ***  Pt. Will achieve a DASH score of *** as to demonstrate improvement in self-perceived functional ability with daily activities.  Baseline:  Goal status: {GOALSTATUS:25110}  2.  Pt will report pain levels improving during ADLs to be less than or equal to ***/10 as to demonstrate improved tolerance with daily functional activities such as ***.  Baseline:  Goal status: {GOALSTATUS:25110}  3.  Pt will improve MMT score for *** to a ***/5 to demonstrate improvement in strength for quality of motion and activity performance.  Baseline:  Goal status: {GOALSTATUS:25110}  4.  *** Baseline:  Goal status: {GOALSTATUS:25110}  5.  *** Baseline:  Goal status: {GOALSTATUS:25110}  6.  *** Baseline:  Goal status: {GOALSTATUS:25110} ---------------------------------------------------------------------------------------------  PLAN: PT FREQUENCY: {rehab frequency:25116}  PT DURATION: {rehab duration:25117}  PLANNED INTERVENTIONS: {rehab planned interventions:25118::"97110-Therapeutic exercises","97530- Therapeutic [phone]- Neuromuscular re-education","97535- Self JXBJ","47829- Manual therapy"}  PLAN FOR NEXT SESSION:  Review HEP, Begin POC as detailed in assessment   Luis Abed, PT 01/24/2024, 1:59 PM

## 2024-01-25 ENCOUNTER — Ambulatory Visit: Payer: MEDICAID | Admitting: Physical Therapy

## 2024-02-01 ENCOUNTER — Encounter: Payer: Self-pay | Admitting: Physical Therapy

## 2024-02-01 ENCOUNTER — Ambulatory Visit: Payer: MEDICAID | Attending: Physical Medicine & Rehabilitation | Admitting: Physical Therapy

## 2024-02-01 ENCOUNTER — Other Ambulatory Visit: Payer: Self-pay

## 2024-02-01 DIAGNOSIS — M79601 Pain in right arm: Secondary | ICD-10-CM | POA: Diagnosis present

## 2024-02-01 DIAGNOSIS — M797 Fibromyalgia: Secondary | ICD-10-CM | POA: Insufficient documentation

## 2024-02-01 DIAGNOSIS — M546 Pain in thoracic spine: Secondary | ICD-10-CM | POA: Insufficient documentation

## 2024-02-01 DIAGNOSIS — M25511 Pain in right shoulder: Secondary | ICD-10-CM | POA: Insufficient documentation

## 2024-02-01 DIAGNOSIS — M79602 Pain in left arm: Secondary | ICD-10-CM | POA: Insufficient documentation

## 2024-02-01 DIAGNOSIS — M542 Cervicalgia: Secondary | ICD-10-CM | POA: Diagnosis present

## 2024-02-01 DIAGNOSIS — M25512 Pain in left shoulder: Secondary | ICD-10-CM | POA: Diagnosis present
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
## 2024-02-01 NOTE — Patient Instructions (Signed)

## 2024-02-15 ENCOUNTER — Encounter: Payer: MEDICAID | Admitting: Physical Therapy

## 2024-02-17 ENCOUNTER — Encounter: Payer: MEDICAID | Admitting: Physical Therapy

## 2024-02-21 ENCOUNTER — Encounter: Payer: Self-pay | Admitting: Student

## 2024-02-21 ENCOUNTER — Ambulatory Visit (INDEPENDENT_AMBULATORY_CARE_PROVIDER_SITE_OTHER): Payer: MEDICAID | Admitting: Student

## 2024-02-21 ENCOUNTER — Encounter: Payer: MEDICAID | Admitting: Physical Therapy

## 2024-02-21 VITALS — BP 130/80 | HR 87 | Ht 69.0 in | Wt 281.2 lb

## 2024-02-21 DIAGNOSIS — Z7689 Persons encountering health services in other specified circumstances: Secondary | ICD-10-CM

## 2024-02-21 DIAGNOSIS — I10 Essential (primary) hypertension: Secondary | ICD-10-CM | POA: Diagnosis not present

## 2024-02-21 NOTE — Patient Instructions (Addendum)
 It was great seeing you today.  As we discussed, - Great job on working on getting more exercise   If you have any questions or concerns, please feel free to call the clinic.   Have a wonderful day,  Dr. Vallorie Gayer [facility], [facility] [phone]     Please go to:  [facility] [address]  [place], [place] [address] [phone]   Urgent psychiatry (medication management) Monday-Thursday 8-11AM.   It is highly recommended that you show up at 7/730 because it is first come first serve.   For urgent therapy (not medication) Walk in hours are 8-1pm Monday through Wednesday (please come at 7/730 to ensure you are seen)

## 2024-02-21 NOTE — Assessment & Plan Note (Signed)
 At goal on lisinopril  20 mg daily. Reviewed recent BMP. Follow-up in 3 months

## 2024-02-21 NOTE — Progress Notes (Signed)
    SUBJECTIVE:   CHIEF COMPLAINT / HPI:   Hypertension Prescribed Lisinopril  20 mg daily Reports compliance Denies chest pain, SOB, edema BP readings at home: Does not A little concerned with weight gain, gained 6 lb since last visit.  Weight management He wants to get back into walking He plans on doing aquatics therapy. 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 No wheezing or crackles on auscultation, good lung sounds throughout Extremities: Moving all 4 extremities equally    ASSESSMENT/PLAN:   Hypertension At goal on lisinopril  20 mg daily. Reviewed recent BMP. 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

## 2024-02-21 NOTE — Assessment & Plan Note (Addendum)
 Praised patient for her efforts and increasing activity Have tried to get patient on GLP-1 in the past without success due to insurance Discussed dietary management He will be doing PT and aquatic therapy I gave him a letter stating that he cannot sustain full-time employment given caretaking of his parents at home so that he receive government food assistance program

## 2024-02-22 ENCOUNTER — Ambulatory Visit: Payer: MEDICAID | Attending: Physical Medicine & Rehabilitation

## 2024-02-22 DIAGNOSIS — M546 Pain in thoracic spine: Secondary | ICD-10-CM | POA: Diagnosis present

## 2024-02-22 DIAGNOSIS — M79601 Pain in right arm: Secondary | ICD-10-CM | POA: Diagnosis present

## 2024-02-22 DIAGNOSIS — M25512 Pain in left shoulder: Secondary | ICD-10-CM | POA: Insufficient documentation

## 2024-02-22 DIAGNOSIS — M542 Cervicalgia: Secondary | ICD-10-CM | POA: Diagnosis present

## 2024-02-22 DIAGNOSIS — M79602 Pain in left arm: Secondary | ICD-10-CM | POA: Insufficient documentation

## 2024-02-22 DIAGNOSIS — M25511 Pain in right shoulder: Secondary | ICD-10-CM | POA: Insufficient documentation
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
## 2024-02-23 ENCOUNTER — Ambulatory Visit: Payer: MEDICAID | Admitting: Physical Therapy

## 2024-02-23 ENCOUNTER — Encounter: Payer: Self-pay | Admitting: Physical Therapy

## 2024-02-23 DIAGNOSIS — M546 Pain in thoracic spine: Secondary | ICD-10-CM

## 2024-02-23 DIAGNOSIS — M542 Cervicalgia: Secondary | ICD-10-CM | POA: Diagnosis not present

## 2024-02-23 DIAGNOSIS — M25511 Pain in right shoulder: Secondary | ICD-10-CM
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
## 2024-02-24 ENCOUNTER — Encounter: Payer: Self-pay | Admitting: Physical Therapy

## 2024-02-28 ENCOUNTER — Ambulatory Visit: Payer: MEDICAID

## 2024-02-28 DIAGNOSIS — M542 Cervicalgia: Secondary | ICD-10-CM | POA: Diagnosis not present

## 2024-02-28 DIAGNOSIS — M546 Pain in thoracic spine: Secondary | ICD-10-CM

## 2024-02-28 DIAGNOSIS — M25511 Pain in right shoulder: Secondary | ICD-10-CM

## 2024-02-28 DIAGNOSIS — M79602 Pain in left arm: Secondary | ICD-10-CM

## 2024-02-28 DIAGNOSIS — M79601 Pain in right arm: Secondary | ICD-10-CM
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
## 2024-02-29 ENCOUNTER — Ambulatory Visit: Payer: MEDICAID | Admitting: Physical Therapy

## 2024-02-29 ENCOUNTER — Encounter: Payer: Self-pay | Admitting: Physical Therapy

## 2024-02-29 DIAGNOSIS — M542 Cervicalgia: Secondary | ICD-10-CM

## 2024-02-29 DIAGNOSIS — M79602 Pain in left arm: Secondary | ICD-10-CM

## 2024-02-29 DIAGNOSIS — M25511 Pain in right shoulder: Secondary | ICD-10-CM

## 2024-02-29 DIAGNOSIS — M79601 Pain in right arm: Secondary | ICD-10-CM

## 2024-02-29 DIAGNOSIS — M546 Pain in thoracic spine: Secondary | ICD-10-CM
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
## 2024-03-02 ENCOUNTER — Other Ambulatory Visit: Payer: Self-pay | Admitting: Student

## 2024-03-02 ENCOUNTER — Encounter: Payer: MEDICAID | Admitting: Physical Therapy
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
## 2024-03-07 ENCOUNTER — Encounter: Payer: Self-pay | Admitting: Physical Therapy

## 2024-03-07 ENCOUNTER — Ambulatory Visit: Payer: MEDICAID | Admitting: Physical Therapy

## 2024-03-07 DIAGNOSIS — M25511 Pain in right shoulder: Secondary | ICD-10-CM

## 2024-03-07 DIAGNOSIS — M542 Cervicalgia: Secondary | ICD-10-CM

## 2024-03-07 DIAGNOSIS — M546 Pain in thoracic spine: Secondary | ICD-10-CM

## 2024-03-07 DIAGNOSIS — M79602 Pain in left arm: Secondary | ICD-10-CM

## 2024-03-07 DIAGNOSIS — M79601 Pain in right arm: Secondary | ICD-10-CM

## 2024-03-15 ENCOUNTER — Encounter: Payer: MEDICAID | Attending: Physical Medicine & Rehabilitation | Admitting: Physical Medicine & Rehabilitation

## 2024-03-15 ENCOUNTER — Encounter: Payer: Self-pay | Admitting: Physical Medicine & Rehabilitation

## 2024-03-15 VITALS — BP 125/82 | Ht 69.0 in | Wt 279.8 lb

## 2024-03-15 DIAGNOSIS — M797 Fibromyalgia: Secondary | ICD-10-CM | POA: Diagnosis not present

## 2024-03-15 DIAGNOSIS — F32A Depression, unspecified: Secondary | ICD-10-CM
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
## 2024-03-27 ENCOUNTER — Encounter: Payer: Self-pay | Admitting: *Deleted

## 2024-04-05 ENCOUNTER — Encounter (HOSPITAL_COMMUNITY): Payer: Self-pay | Admitting: Licensed Clinical Social Worker

## 2024-04-05 ENCOUNTER — Ambulatory Visit (HOSPITAL_COMMUNITY): Payer: MEDICAID | Admitting: Licensed Clinical Social Worker

## 2024-04-05 DIAGNOSIS — F900 Attention-deficit hyperactivity disorder, predominantly inattentive type: Secondary | ICD-10-CM

## 2024-04-05 DIAGNOSIS — F3341 Major depressive disorder, recurrent, in partial remission: Secondary | ICD-10-CM | POA: Insufficient documentation

## 2024-04-05 DIAGNOSIS — F4010 Social phobia, unspecified: Secondary | ICD-10-CM | POA: Insufficient documentation

## 2024-04-05 DIAGNOSIS — F331 Major depressive disorder, recurrent, moderate: Secondary | ICD-10-CM | POA: Insufficient documentation

## 2024-04-05 DIAGNOSIS — F333 Major depressive disorder, recurrent, severe with psychotic symptoms: Secondary | ICD-10-CM | POA: Insufficient documentation

## 2024-04-05 DIAGNOSIS — F840 Autistic disorder: Secondary | ICD-10-CM | POA: Insufficient documentation

## 2024-04-05 DIAGNOSIS — F845 Asperger's syndrome: Secondary | ICD-10-CM | POA: Insufficient documentation
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
## 2024-04-07 ENCOUNTER — Ambulatory Visit: Payer: MEDICAID
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
## 2024-04-10 ENCOUNTER — Encounter: Payer: Self-pay | Admitting: Student

## 2024-04-10 ENCOUNTER — Ambulatory Visit: Payer: MEDICAID | Admitting: Student

## 2024-04-10 VITALS — BP 117/78 | HR 74 | Temp 98.0°F | Ht 69.0 in | Wt 277.2 lb

## 2024-04-10 DIAGNOSIS — H9201 Otalgia, right ear: Secondary | ICD-10-CM

## 2024-04-10 NOTE — Progress Notes (Signed)
    SUBJECTIVE:   CHIEF COMPLAINT / HPI:   Ear pain  dizziness Patient reports 1 day of right-sided ear pain and dizziness.  Denies all other systemic symptoms aside from sore throat and tactile fever yesterday.  No tinnitus, hearing loss.  Dizziness is not positional, lasts for seconds at a time.  He has had dizziness in the past.  History of ear infections and sinus infections.   OBJECTIVE:   BP 117/78   Pulse 74   Temp 98 F (36.7 C) (Oral)   Ht 5' 9 (1.753 m)   Wt 277 lb 4 oz (125.8 kg)   SpO2 97%   BMI 40.94 kg/m    General: NAD, pleasant, HEENT: Normocephalic, atraumatic head. Normal external ear, canal, TM bilaterally. EOM intact and normal conjunctiva BL. Normal external nose. Throat erythematous, no exudate, no deviation. Normal dentition.  Cardio: RRR, no MRG. Cap Refill <2s. Respiratory: CTAB, normal wob on RA Skin: Warm and dry  ASSESSMENT/PLAN:   Assessment & Plan Right ear pain Differential includes: Viral infection, sinus infection. Low concern for left otitis media, externa given normal findings on otoscope.  Reassess for dizziness with otitis media/as infections, typically resolve with infection.  Unable to reproduce dizziness. - Supportive care measures discussed - Follow-up in 1 week if no improving or new symptoms develop  Gladis Church, DO [facility]

## 2024-04-10 NOTE — Patient Instructions (Addendum)
 It was great to see you! Thank you for allowing me to participate in your care!   I recommend that you always bring your medications to each appointment as this makes it easy to ensure we are on the correct medications and helps us  not miss when refills are needed.  Our plans for today:  - Please take 600 mg of ibuprofen  every 6-8 hours as needed for pain.  May also take 1000 mg of Tylenol  every 6 hours as needed for pain. - I recommend avoid putting any objects inside ear canal. - Follow-up in 1 week if symptoms fail to improve or sooner if new symptoms develop    Take care and seek immediate care sooner if you develop any concerns. Please remember to show up 15 minutes before your scheduled appointment time!  Gladis Church, DO [facility]

## 2024-04-30 ENCOUNTER — Encounter: Payer: Self-pay | Admitting: Physical Medicine & Rehabilitation

## 2024-04-30 ENCOUNTER — Encounter: Payer: MEDICAID | Attending: Physical Medicine & Rehabilitation | Admitting: Physical Medicine & Rehabilitation

## 2024-04-30 VITALS — BP 134/90 | HR 72 | Ht 69.0 in | Wt 282.2 lb

## 2024-04-30 DIAGNOSIS — M797 Fibromyalgia: Secondary | ICD-10-CM | POA: Diagnosis not present

## 2024-04-30 DIAGNOSIS — Z5181 Encounter for therapeutic drug level monitoring: Secondary | ICD-10-CM | POA: Diagnosis not present

## 2024-04-30 DIAGNOSIS — F32A Depression, unspecified: Secondary | ICD-10-CM | POA: Insufficient documentation

## 2024-04-30 DIAGNOSIS — G894 Chronic pain syndrome: Secondary | ICD-10-CM | POA: Diagnosis not present
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
## 2024-05-03 ENCOUNTER — Ambulatory Visit (HOSPITAL_COMMUNITY): Payer: MEDICAID | Admitting: Licensed Clinical Social Worker

## 2024-05-04 LAB — DRUG TOX MONITOR 1 W/CONF, ORAL FLD

## 2024-05-04 LAB — DRUG TOX ALC METAB W/CON, ORAL FLD: Alcohol Metabolite: NEGATIVE ng/mL (ref <25)

## 2024-05-05 ENCOUNTER — Ambulatory Visit: Payer: MEDICAID | Attending: Physical Medicine & Rehabilitation

## 2024-05-05 DIAGNOSIS — M25512 Pain in left shoulder: Secondary | ICD-10-CM | POA: Diagnosis present

## 2024-05-05 DIAGNOSIS — M79601 Pain in right arm: Secondary | ICD-10-CM | POA: Insufficient documentation

## 2024-05-05 DIAGNOSIS — M79602 Pain in left arm: Secondary | ICD-10-CM | POA: Diagnosis present

## 2024-05-05 DIAGNOSIS — M25511 Pain in right shoulder: Secondary | ICD-10-CM | POA: Diagnosis present

## 2024-05-05 DIAGNOSIS — M546 Pain in thoracic spine: Secondary | ICD-10-CM | POA: Insufficient documentation

## 2024-05-05 DIAGNOSIS — M542 Cervicalgia: Secondary | ICD-10-CM | POA: Diagnosis present
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
## 2024-05-10 ENCOUNTER — Ambulatory Visit (HOSPITAL_COMMUNITY): Payer: MEDICAID | Admitting: Psychiatry

## 2024-05-10 ENCOUNTER — Encounter (HOSPITAL_COMMUNITY): Payer: Self-pay | Admitting: Psychiatry

## 2024-05-10 DIAGNOSIS — F3341 Major depressive disorder, recurrent, in partial remission: Secondary | ICD-10-CM

## 2024-05-10 DIAGNOSIS — Z9189 Other specified personal risk factors, not elsewhere classified: Secondary | ICD-10-CM

## 2024-05-10 DIAGNOSIS — F902 Attention-deficit hyperactivity disorder, combined type: Secondary | ICD-10-CM | POA: Diagnosis not present

## 2024-05-10 MED ORDER — METHYLPHENIDATE HCL 5 MG PO TABS: 5.0000 mg | ORAL_TABLET | Freq: Every day | ORAL | 0 refills | Status: DC
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
## 2024-05-10 NOTE — Patient Instructions (Addendum)
 Thank you for attending your appointment today.  -- START Ritalin  5 mg each morning -- SPLIT dosing of Cymbalta  to 60 mg in the morning + 30 mg nightly -- Continue other medications as prescribed.  Please do not make any changes to medications without first discussing with your provider. If you are experiencing a psychiatric emergency, please call 911 or present to your nearest emergency department. Additional crisis, medication management, and therapy resources are included below.  [facility]  [address], [place], [place] [address] [phone] WALK-IN URGENT CARE 24/7 FOR ANYONE [address] [place], [place]  [phone] Fax: [phone] [email or web address] *Interpreters available *Accepts all insurance and uninsured for Urgent Care needs *Accepts Medicaid and uninsured for outpatient treatment (below)      ONLY FOR [facility]  Below:    Outpatient New Patient Assessment/Therapy Walk-ins:        Monday, Wednesday, and Thursday 8am until slots are full (first come, first served)                   New Patient Psychiatry/Medication Management        Monday-Friday 8am-11am (first come, first served)               For all walk-ins we ask that you arrive by 7:15am, because patients will be seen in the order of arrival.

## 2024-05-11 ENCOUNTER — Ambulatory Visit: Payer: MEDICAID | Admitting: Physical Medicine & Rehabilitation

## 2024-05-11 ENCOUNTER — Ambulatory Visit: Payer: MEDICAID
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
## 2024-05-21 ENCOUNTER — Encounter (HOSPITAL_COMMUNITY): Payer: Self-pay

## 2024-05-21 ENCOUNTER — Ambulatory Visit (HOSPITAL_COMMUNITY): Payer: MEDICAID | Admitting: Licensed Clinical Social Worker

## 2024-05-24 ENCOUNTER — Ambulatory Visit: Payer: MEDICAID

## 2024-05-24 ENCOUNTER — Ambulatory Visit (HOSPITAL_COMMUNITY): Payer: MEDICAID | Admitting: Licensed Clinical Social Worker
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
## 2024-05-29 ENCOUNTER — Ambulatory Visit (INDEPENDENT_AMBULATORY_CARE_PROVIDER_SITE_OTHER): Payer: MEDICAID | Admitting: Licensed Clinical Social Worker

## 2024-05-29 DIAGNOSIS — F3341 Major depressive disorder, recurrent, in partial remission: Secondary | ICD-10-CM

## 2024-05-29 DIAGNOSIS — F840 Autistic disorder: Secondary | ICD-10-CM

## 2024-05-29 NOTE — Progress Notes (Addendum)
 THERAPIST PROGRESS NOTE  Virtual Visit via Video Note  I connected with Derrick Carey on 05/29/24 at 11:00 AM EDT by a video enabled telemedicine application and verified that I am speaking with the correct person using two identifiers.  Location: Patient: [facility]  Provider: Providers home office    I discussed the limitations of evaluation and management by telemedicine and the availability of in person appointments. The patient expressed understanding and agreed to proceed.      I discussed the assessment and treatment plan with the patient. The patient was provided an opportunity to ask questions and all were answered. The patient agreed with the plan and demonstrated an understanding of the instructions.   The patient was advised to call back or seek an in-person evaluation if the symptoms worsen or if the condition fails to improve as anticipated.  I provided 45 minutes of non-face-to-face time during this encounter.   Derrick GORMAN Patee, LCSW   Participation Level: Active  Behavioral Response: CasualAlertAnxious and Depressed  Type of Therapy: Individual Therapy  Treatment Goals addressed:  Active     OP Depression     LTG: Reduce frequency, intensity, and duration of depression symptoms so that daily functioning is improved (Progressing)     Start:  04/05/24    Expected End:  10/19/24         LTG: Increase coping skills to manage depression and improve ability to perform daily activities (Progressing)     Start:  04/05/24    Expected End:  10/19/24         LTG: Bon will score less than 10 on the Patient Health Questionnaire (PHQ-9)  (Progressing)     Start:  04/05/24    Expected End:  10/19/24         STG: Derrick will participate in at least 80% of scheduled individual psychotherapy sessions  (Progressing)     Start:  04/05/24    Expected End:  10/19/24         STG: Derrick will complete at least 80% of assigned homework  (Progressing)     Start:   04/05/24    Expected End:  10/19/24         Work with Derrick to identify the major components of a recent episode of depression: physical symptoms, major thoughts and images, and major behaviors they experienced     Start:  04/05/24         Work with Derrick to track symptoms, triggers, and/or skill use through a mood chart, diary card, or journal (Completed)     Start:  04/05/24    End:  05/29/24      Encourage Derrick Carey to participate in recovery peer support activities weekly  (Completed)     Start:  04/05/24    End:  05/29/24      Therapist will administer the PHQ-9 at weekly intervals for the next 10 weeks (Completed)     Start:  04/05/24    End:  05/29/24      Provide Derrick Carey educational information and reading material on dissociation, its causes, and symptoms (Completed)     Start:  04/05/24    End:  05/29/24        ProgressTowards Goals: Progressing  Interventions: CBT and Motivational Interviewing    Suicidal/Homicidal: Nowithout intent/plan  Therapist Response:    Derrick Carey was alert and oriented x 5.  He was pleasant, cooperative, maintained good eye contact.  He engaged well in therapy session was  dressed casually.  He presented with depressed and anxious mood\affect.  Derrick Carey comes in today trying to get through things day by day.  Patient has significant history of autism, major depressive disorder, and social anxiety.  Stressors for the patient to include physical illness for fibromyalgia.  Derrick Carey has been struggling with coming to terms with his limitations for his ability to utilize his arms with pain affiliated with fibromyalgia.  Derrick Carey states that he has been engaging in interventions such as physical therapy, exercise, and follow-up with specialists with no relief.  Other stressors for patient are caregiver fatigue.  Patient has history of taking care of his grandparents before they passed away and is now taking care of his own parents.  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  Patient states self-neglect due to the fact that he likes to give to others versus helping himself.  Patient reports problems with self-care and would like to become financially independent utilizing the example of reapplying for Social Security disability.  Intervention/plan: LCSW educated patient on a growth mindset versus fixed mindset.  LCSW provided patient examples of a growth mindset versus fixed mindset.  LCSW provided patient with tips for developing a growth mindset.  LCSW educated patient on gratitude exercises.  Plan for patient is to review those worksheets and in next session.  LCSW validated feelings in session.  LCSW provided patient with psychoanalytic therapy for patient to express thoughts, feelings and emotions and nonjudgmental environment.  LCSW utilized supportive therapy for praise and encouragement. Plan: Return again in 4 weeks.  Diagnosis: MDD (major depressive disorder), recurrent, in partial remission (HCC)  Autism spectrum disorder  Collaboration of Care: Other continued individual therapy  Patient/Guardian was advised Release of Information must be obtained prior to any record release in order to collaborate their care with an outside provider. Patient/Guardian was advised if they have not already done so to contact the registration department to sign all necessary forms in order for us  to release information regarding their care.   Consent: Patient/Guardian gives verbal consent for treatment and assignment of benefits for services provided during this visit. Patient/Guardian expressed understanding and agreed to proceed.   Derrick GORMAN Patee, LCSW 05/29/2024

## 2024-06-13 ENCOUNTER — Ambulatory Visit: Payer: MEDICAID | Admitting: Physical Therapy

## 2024-06-14 ENCOUNTER — Ambulatory Visit (HOSPITAL_COMMUNITY): Payer: MEDICAID | Admitting: Licensed Clinical Social Worker

## 2024-06-15 ENCOUNTER — Ambulatory Visit (INDEPENDENT_AMBULATORY_CARE_PROVIDER_SITE_OTHER): Payer: MEDICAID | Admitting: Licensed Clinical Social Worker

## 2024-06-15 DIAGNOSIS — F840 Autistic disorder: Secondary | ICD-10-CM | POA: Diagnosis not present

## 2024-06-15 DIAGNOSIS — F902 Attention-deficit hyperactivity disorder, combined type: Secondary | ICD-10-CM

## 2024-06-15 NOTE — Progress Notes (Signed)
 THERAPIST PROGRESS NOTE  Virtual Visit via Video Note  I connected with Derrick Carey on 06/17/24 at 12:00 PM EDT by a video enabled telemedicine application and verified that I am speaking with the correct person using two identifiers.  Location: Patient: [facility]  Provider: Providers Home office    I discussed the limitations of evaluation and management by telemedicine and the availability of in person appointments. The patient expressed understanding and agreed to proceed.      I discussed the assessment and treatment plan with the patient. The patient was provided an opportunity to ask questions and all were answered. The patient agreed with the plan and demonstrated an understanding of the instructions.   The patient was advised to call back or seek an in-person evaluation if the symptoms worsen or if the condition fails to improve as anticipated.  I provided 40 minutes of non-face-to-face time during this encounter.   Derrick GORMAN Patee, LCSW   Participation Level: Active  Behavioral Response: CasualAlertAnxious, Depressed, Hopeless, and Worthless  Type of Therapy: Individual Therapy  Treatment Goals addressed:  Active     OP Depression     LTG: Reduce frequency, intensity, and duration of depression symptoms so that daily functioning is improved (Progressing)     Start:  04/05/24    Expected End:  10/19/24         LTG: Increase coping skills to manage depression and improve ability to perform daily activities (Progressing)     Start:  04/05/24    Expected End:  10/19/24         LTG: Derrick Carey will score less than 10 on the Patient Health Questionnaire (PHQ-9)  (Progressing)     Start:  04/05/24    Expected End:  10/19/24         STG: Derrick will participate in at least 80% of scheduled individual psychotherapy sessions  (Progressing)     Start:  04/05/24    Expected End:  10/19/24         STG: Derrick will complete at least 80% of assigned homework   (Progressing)     Start:  04/05/24    Expected End:  10/19/24         Work with Derrick to identify the major components of a recent episode of depression: physical symptoms, major thoughts and images, and major behaviors they experienced     Start:  04/05/24         Work with Derrick to track symptoms, triggers, and/or skill use through a mood chart, diary card, or journal (Completed)     Start:  04/05/24    End:  05/29/24      Encourage Derrick Carey to participate in recovery peer support activities weekly  (Completed)     Start:  04/05/24    End:  05/29/24      Therapist will administer the PHQ-9 at weekly intervals for the next 10 weeks (Completed)     Start:  04/05/24    End:  05/29/24      Provide Derrick Carey educational information and reading material on dissociation, its causes, and symptoms (Completed)     Start:  04/05/24    End:  05/29/24         ProgressTowards Goals: Progressing  Interventions: CBT, Motivational Interviewing, and Supportive   Suicidal/Homicidal: Nowithout intent/plan  Therapist Response:    Pt was alert and oriented x 5. He was pleasant, cooperative and maintained good eye contact. Pt presented with an anxious  mood/affect. Derrick Carey engaged well in therapy session and was dressed casually.   Pt endorses feelings of worthlessness, hopelessness, tension, and worry. This is due to his physical limitation based on physical illness Hx and chronic pain in his arm. Pt and LCSW reviewed cognitive distortion and how they are a normal part of day-to-day life, however used too frequently and they an effect mood/feeling negatively. LCSW reviewed worksheet for cognitive distortion by [employer]. LCSW sent work sheet to pt. LCSW and pt spoke about cognitive distraction he feels have affected him negatively such as dismissing the positive, Catastrophizing events, and the use of "should statements".   LCSW also educated pt on the use of partialization. Breaking things down  to become more manageable. LCSW and pt about the overall goals can still be the same but steps can be added to help aide the process. LCSW reviewed stages of change with pt. These stages include precontemplation, contemplation, action and maintenance. LCSW reviewed gratitude exercises with pt to review to help decrease depressive symptoms. LCSW Validated feelings in session. LCSW utilized open ended questions, positive affirmation, and reflective listening for motivational interviewing.    Plan: Return again in 5 weeks.  Diagnosis: Autism spectrum disorder  Attention deficit hyperactivity disorder (ADHD), combined type  Collaboration of Care: Other Continued individual therapy   Patient/Guardian was advised Release of Information must be obtained prior to any record release in order to collaborate their care with an outside provider. Patient/Guardian was advised if they have not already done so to contact the registration department to sign all necessary forms in order for us  to release information regarding their care.   Consent: Patient/Guardian gives verbal consent for treatment and assignment of benefits for services provided during this visit. Patient/Guardian expressed understanding and agreed to proceed.   Derrick GORMAN Patee, LCSW 06/17/2024

## 2024-06-20 ENCOUNTER — Ambulatory Visit: Payer: MEDICAID | Admitting: Physical Therapy

## 2024-07-02 ENCOUNTER — Encounter: Payer: MEDICAID | Attending: Physical Medicine & Rehabilitation | Admitting: Physical Medicine & Rehabilitation

## 2024-07-02 ENCOUNTER — Encounter: Payer: Self-pay | Admitting: Physical Medicine & Rehabilitation

## 2024-07-02 VITALS — BP 127/79 | HR 86 | Ht 69.0 in | Wt 282.8 lb

## 2024-07-02 DIAGNOSIS — F32A Depression, unspecified: Secondary | ICD-10-CM | POA: Diagnosis not present

## 2024-07-02 DIAGNOSIS — M797 Fibromyalgia: Secondary | ICD-10-CM | POA: Diagnosis present

## 2024-07-02 DIAGNOSIS — G894 Chronic pain syndrome: Secondary | ICD-10-CM | POA: Diagnosis present

## 2024-07-02 MED ORDER — TRAMADOL HCL 50 MG PO TABS: 50.0000 mg | ORAL_TABLET | Freq: Two times a day (BID) | ORAL | 1 refills | Status: DC | PRN

## 2024-07-02 MED ORDER — TRAMADOL HCL 50 MG PO TABS: 50.0000 mg | ORAL_TABLET | Freq: Two times a day (BID) | ORAL | 0 refills | Status: DC | PRN
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
## 2024-07-04 ENCOUNTER — Encounter (HOSPITAL_COMMUNITY): Payer: Self-pay

## 2024-07-06 NOTE — Progress Notes (Signed)
 BH MD Outpatient Progress Note  07/09/2024 3:45 PM Derrick Carey  MRN:  [id number]  Assessment:  Derrick Carey presents for follow-up evaluation. Today, 07/09/24, patient reports tolerability of and benefit from Ritalin  for symptoms of ADHD and notes improved focus in completing desired tasks throughout the day. Denies issues with appetite suppression or sedation as with previous stimulant trials. He has recently been started on tramadol  by PM&R for chronic pain; unfortunately patient stopped Cymbalta  due to concern for side effects. Counseled on risks with sudden discontinuation of psychotropics. Given prior benefit, amenable to restarting at low dose to better assess for adverse effects particularly in light of DDI; psychoeducation provided on signs/sx of serotonin syndrome. Patient reported delayed sleep phase likely related to technology and social media use at night; extensive portion of session dedicated to reviewing recommendations for sleep hygiene. Patient will still benefit from sleep study given reported snoring and daytime fatigue and was encouraged to follow-up on referral.  RTC in 7 weeks by video.  Patient was made aware of this provider's departure from [facility] at the end of Nov 2025 and that he will be transitioned to alternative provider in the clinic after this time. All questions/concerns addressed.  Identifying Information: Derrick Carey is a 33 y.o. male with a history of autism spectrum disorder, ADHD, HTN, and fibromyalgia who is an established patient with [facility]. Patient was formally diagnosed with ASD and ADHD in 3rd grade by psychologist; these diagnoses were felt to be corroborated on initial assessment. While there has been report of AVH in the chart, on further exploration these experiences are not felt to represent symptoms of primary psychosis but rather perceptual disturbances and intrusive thoughts during periods of sensory overload and  intense emotional distress. He describes that intrusive thoughts of harm to self are infrequent and fleeting and that he is able to successfully visualize throwing them away. He shows significant insight into these experiences as being borne out of place of overwhelm.  Plan:  # ASD  ADHD Past medication trials: Ritalin , Adderall, Concerta   Status of problem: chronic; ADHD - improving Interventions: -- Continue Ritalin  5 mg qAM (s7/24/25) -- Utox negative 04/30/24 -- BP and HR wnl 07/02/24   # History of MDD, recurrent Past medication trials: Lexapro  Status of problem: stable Interventions: -- RESTART Cymbalta  at 30 mg daily due to patient concern for side effects and DDI -- Patient recently started on Tramadol  by PM&R for pain management; risks/benefits of this medication including DDI and signs/sx of serotonin syndrome discussed. Continue careful monitoring and conservative titration as indicated. -- Continue pain management through PM&R -- Continue individual psychotherapy with Ridge Goldammer, LCSW   # Reported snoring, vivid dreams, daytime fatigue # Delayed sleep phase Status of problem: new problem to this provider Interventions: -- Referral to sleep medicine previously placed in July; encouraged patient to return their phone call -- Extensively counseled on sleep hygiene and gradually shifting bedtime earlier every few days  Patient was given contact information for behavioral health clinic and was instructed to call 911 for emergencies.   Subjective:  Chief Complaint:  Chief Complaint  Patient presents with   Medication Management    Interval History:   Chart review: -- PM&R 07/02/24: patient started on tramadol  50 mg BID. (This Clinical research associate discussed risks/benefits of this medication with PM&R including careful monitoring for serotonin syndrome).    Patient reports he is hanging in there - has found Ritalin  helpful for keeping focused. More  organized and feels like he  is able to concentrate on desired activities/not get distracted by extraneous stimuli. Denies adverse effects or feeling sedated as he did when previously on stimulants. Confirms taking Ritalin  in the morning; appetite is stable and eating 3 meals a day.   Started tramadol  per PM&R - has been helpful for pain and has been trying to do more. However notes he is trying to learn how to listen to his body and not overexert himself. Trying to get back into swimming. Notes pain with physical therapy in the past.  Since starting tramadol , reports his muscles have felt a little more tense but denies worsening tremor from baseline; diarrhea; diaphoresis. States he stopped duloxetine  on 9/15 since starting tramadol  due to concern for side effects and not sure how he would do with the combination. Psychoeducation provided on risks with sudden discontinuation of medications and to discuss with psychiatric team before making self-adjustments to medications. Not sure how he did with split dosing of Cymbalta  - notes that N/V has improved although may still get random bouts of this.   Describes mood as pretty shit associated with certain events in the news. Reports he deleted X from his phone; has been listening to more soothing music. Before this event, feels mood had been hanging in there. Continues to enjoy helping his parents; doing things that give him a sense of productivity; playing games with friends. Reports ongoing VH of vermin out of the corner of his eye that disappear when he gets a closer look.  Not distressing. Reports AH but describes as inside his head and as intrusive urges that can be violent or self-injurous to which he tells them to fu** off. Denies any desire/intent/planning to act on these thoughts - I use my mental baseball bat to cast them away. States these have been chronic and has learned how to deal with them.  Sleeping oddly with about 5-8 hours but reports delayed sleep schedule  sometimes not falling asleep until crack of dawn. Reports worries keep him awake. Reports watching news/TV/going on social media at night. Substantial portion of visit dedicated to providing psychoeducation on sleep hygiene and gradually shifting bed time earlier by 1 hour every few nights. Encouraged to f/u with sleep clinic.   Amenable to restarting Cymbalta  at low dose to better assess tolerability particularly in light with other interacting medications.     PDMP: -- Methylphenidate  5 mg tablet QTY 30 last filled 05/10/24   Visit Diagnosis:    ICD-10-CM   1. Autism spectrum disorder  F84.0     2. Attention deficit hyperactivity disorder (ADHD), combined type  F90.2     3. MDD (major depressive disorder), recurrent, in partial remission (HCC)  F33.41       Past Psychiatric History:  Diagnoses: autism spectrum disorder, ADHD combined type (diagnosed with ASD and ADHD by psychologist in 3rd grade) Medication trials: Lexapro , Ritalin , Adderall, Concerta   Previous psychiatrist/therapist: x1 in childhood when expressing SI in elementary school Hospitalizations: denies Suicide attempts: denies SIB: reports he may hit himself in the head when hand when overwhelmed; denies other self harm behaviors Hx of violence towards others: denies Current access to guns: denies Hx of trauma/abuse: denies Substance use:              -- Etoh: 1 rum and coke about 5 times a year             -- Denies use of illicit drugs including cannabis/CBD/THC             --  Tobacco: denies  Past Medical History:  Past Medical History:  Diagnosis Date   ADHD (attention deficit hyperactivity disorder)    Autism spectrum disorder    Chronic cough 08/12/2011   Onset ? 06/2019 - ENT eval per [facility] neg 11/15/19 included neg sinus ct -Allergy profile  05/19/20  >  Eos 0.2 /  IgE  15  - 05/19/2020 some better p max gerd/ 1st gen H1 blockers per guidelines  > add gabapentin  100 tid  - 07/14/2020 increased gabapentin  to  300 mg tid and  h1 p supper and hs only  (otherwise ? Make him too sleepy in daytime)    Depression    Dry skin 02/08/2012   1 mth hx of dry skin in back of right ear.   Allergic dermatitis vs contact dermatitis vs eczma   GERD (gastroesophageal reflux disease)    Lateral epicondylitis of right elbow 06/14/2018   Papule of skin 06/19/2015   Passive suicidal ideations 08/10/2022   Right arm pain 08/01/2018   Sinusitis, acute maxillary 11/26/2013    Past Surgical History:  Procedure Laterality Date   LACERATION REPAIR     in elementary school X 3    Family Psychiatric History:  Maternal aunt: depression Paternal uncle: died by suicide  Family History:  Family History  Problem Relation Age of Onset   Hyperlipidemia Mother    Hypertension Mother    Congenital heart disease Father    Heart attack Father    Arthritis Father    Liver disease Father        Fatty Liver   Hypertension Father    Congestive Heart Failure Father    Heart disease Father        Heart Attacks 3    Social History:  Academic/Vocational: completed 12th grade - had IEP; 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 - employment disrupted by complications from [employer]    Social History   Socioeconomic History   Marital status: Single    Spouse name: Not on file   Number of children: 0   Years of education: Not on file   Highest education level: Not on file  Occupational History   Not on file  Tobacco Use   Smoking status: Former    Current packs/day: 0.50    Average packs/day: 0.5 packs/day for 1 year (0.5 ttl pk-yrs)    Types: Cigarettes    Passive exposure: Past   Smokeless tobacco: Never  Vaping Use   Vaping status: Never Used  Substance and Sexual Activity   Alcohol use: Yes    Comment: rarely   Drug use: No   Sexual activity: Not Currently  Other Topics Concern   Not on file  Social History Narrative   [employer] - Lives at home with Mom and Dad         Right Handed    Lives  in a one story home    Social Drivers of Health   Financial Resource Strain: Medium Risk (04/05/2024)   Overall Financial Resource Strain (CARDIA)    Difficulty of Paying Living Expenses: Somewhat hard  Food Insecurity: No Food Insecurity (04/05/2024)   Hunger Vital Sign    Worried About Running Out of Food in the Last Year: Never true    Ran Out of Food in the Last Year: Never true  Transportation Needs: No Transportation Needs (04/05/2024)   PRAPARE - Administrator, Civil Service (Medical): No    Lack of Transportation (  Non-Medical): No  Physical Activity: Insufficiently Active (04/05/2024)   Exercise Vital Sign    Days of Exercise per Week: 4 days    Minutes of Exercise per Session: 10 min  Stress: Stress Concern Present (04/05/2024)   [employer] of Occupational Health - Occupational Stress Questionnaire    Feeling of Stress: Very much  Social Connections: Socially Isolated (04/05/2024)   Social Connection and Isolation Panel    Frequency of Communication with Friends and Family: Never    Frequency of Social Gatherings with Friends and Family: Never    Attends Religious Services: Never    Database administrator or Organizations: No    Attends Banker Meetings: Never    Marital Status: Never married    Allergies:  Allergies  Allergen Reactions   Sulfa Antibiotics Rash    Current Medications: Current Outpatient Medications  Medication Sig Dispense Refill   baclofen  (LIORESAL ) 10 MG tablet TAKE ONE TABLET BY MOUTH THREE TIMES A DAY AS NEEDED FOR MUSCLE SPASMS 90 tablet 0   traMADol  (ULTRAM ) 50 MG tablet Take 1 tablet (50 mg total) by mouth every 12 (twelve) hours as needed. 60 tablet 1   diclofenac  Sodium (VOLTAREN  ARTHRITIS PAIN) 1 % GEL Apply 4 g topically 4 (four) times daily. 150 g 3   DULoxetine  (CYMBALTA ) 30 MG capsule Take 1 capsule (30 mg total) by mouth daily. 30 capsule 2   ibuprofen  (ADVIL ,MOTRIN ) 800 MG tablet Take 1 tablet (800 mg  total) by mouth every 8 (eight) hours as needed. 30 tablet 0   lisinopril  (ZESTRIL ) 20 MG tablet Take 1 tablet (20 mg total) by mouth at bedtime. 90 tablet 3   methylphenidate  (RITALIN ) 5 MG tablet Take 1 tablet (5 mg total) by mouth daily. 30 tablet 0   [START ON 08/08/2024] methylphenidate  (RITALIN ) 5 MG tablet Take 1 tablet (5 mg total) by mouth daily. 30 tablet 0   pregabalin  (LYRICA ) 100 MG capsule Take 1 capsule (100 mg total) by mouth 2 (two) times daily. Take 150mg  twice a day for 3 weeks. If pain is not improved and tolerating in increase to three times a day 60 capsule 3   No current facility-administered medications for this visit.    ROS: See above  Objective:  Psychiatric Specialty Exam: There were no vitals taken for this visit.There is no height or weight on file to calculate BMI.  General Appearance: Casual and Fairly Groomed; hair long  Eye Contact:  Good  Speech:  Clear and Coherent, Normal Rate, and stilted  Volume:  Normal  Mood:  hanging in there  Affect:  Euthymic; calm  Thought Content: Denies AVH; no overt delusional thought content   Suicidal Thoughts:  No  Homicidal Thoughts:  No  Thought Process:  Goal Directed and Linear  Orientation:  Full (Time, Place, and Person)    Memory:  Grossly intact   Judgment:  Good  Insight:  Good  Concentration:  Concentration: intact to interview  Recall:  not formally [employer] of Knowledge: Good  Language: Good  Psychomotor Activity:  Normal  Akathisia:  No  AIMS (if indicated): not done  Assets:  Communication Skills Desire for Improvement Housing Leisure Time Social Support Talents/Skills  ADL's:  Intact  Cognition: WNL  Sleep:  delayed sleep phase   PE: General: sits comfortably in view of camera; no acute distress  Pulm: no increased work of breathing on room air  MSK: all extremity movements appear intact Neuro: no focal  neurological deficits observed  Gait & Station: unable to assess by  video    Metabolic Disorder Labs: Lab Results  Component Value Date   HGBA1C 5.0 08/30/2023   No results found for: PROLACTIN Lab Results  Component Value Date   CHOL 211 (H) 12/14/2023   TRIG 219 (H) 12/14/2023   HDL 28 (L) 12/14/2023   CHOLHDL 7.5 (H) 12/14/2023   VLDL 50 (H) 12/15/2015   LDLCALC 143 (H) 12/14/2023   LDLCALC 132 (H) 05/17/2023   Lab Results  Component Value Date   TSH 1.250 12/14/2023   TSH 1.08 10/29/2022    Therapeutic Level Labs: No results found for: LITHIUM No results found for: VALPROATE No results found for: CBMZ  Screenings:  GAD-7    Flowsheet Row Counselor from 04/05/2024 in [facility]  Total GAD-7 Score 20   PHQ2-9    Flowsheet Row Office Visit from 07/02/2024 in [facility] Office Visit from 04/30/2024 in [facility] Medicine and Rehabilitation Counselor from 04/05/2024 in [facility] Office Visit from 03/15/2024 in [facility] Physical Medicine and Rehabilitation Office Visit from 02/21/2024 in [facility] [place]  PHQ-2 Total Score 4 1 6 4 2   PHQ-9 Total Score -- 3 25 -- 5   Flowsheet Row Counselor from 04/05/2024 in [facility] ED from 08/03/2022 in [facility] Emergency Department at [facility]  C-SSRS RISK CATEGORY Moderate Risk No Risk    Collaboration of Care: Collaboration of Care: Medication Management AEB active medication management and Psychiatrist AEB established with this provider  Patient/Guardian was advised Release of Information must be obtained prior to any record release in order to collaborate their care with an outside provider. Patient/Guardian was advised if they have not already done so to contact the registration department to sign all necessary forms in order for us  to release information regarding their care.    Consent: Patient/Guardian gives verbal consent for treatment and assignment of benefits for services provided during this visit. Patient/Guardian expressed understanding and agreed to proceed.   Televisit via video: I connected with patient on 07/09/24 at  2:30 PM EDT by a video enabled telemedicine application and verified that I am speaking with the correct person using two identifiers.  Location: Patient: home address in [place] Provider: remote office in [place]   I discussed the limitations of evaluation and management by telemedicine and the availability of in person appointments. The patient expressed understanding and agreed to proceed.  I discussed the assessment and treatment plan with the patient. The patient was provided an opportunity to ask questions and all were answered. The patient agreed with the plan and demonstrated an understanding of the instructions.   The patient was advised to call back or seek an in-person evaluation if the symptoms worsen or if the condition fails to improve as anticipated.  I provided 35 minutes dedicated to the care of this patient via video on the date of this encounter to include chart review, face-to-face time with the patient, medication management/counseling, documentation.  Psychotherapy was utilized during today's session from 2:50-3:10PM. Therapeutic interventions included empathic listening, supportive therapy, cognitive and behavioral therapy, CBT-i. Used supportive interviewing techniques to provide emotional validation. Worked on cognitive reframing techniques and recommendations made for promoting sleep hygiene.  Improvement was evidenced by patient's participation and identified commitment to therapy goals.   Jayquan Bradsher A [facility]  07/09/2024, 3:45 PM

## 2024-07-09 ENCOUNTER — Encounter (HOSPITAL_COMMUNITY): Payer: Self-pay | Admitting: Psychiatry

## 2024-07-09 ENCOUNTER — Telehealth (INDEPENDENT_AMBULATORY_CARE_PROVIDER_SITE_OTHER): Payer: MEDICAID | Admitting: Psychiatry

## 2024-07-09 DIAGNOSIS — F840 Autistic disorder: Secondary | ICD-10-CM | POA: Diagnosis not present

## 2024-07-09 DIAGNOSIS — F902 Attention-deficit hyperactivity disorder, combined type: Secondary | ICD-10-CM | POA: Diagnosis not present

## 2024-07-09 DIAGNOSIS — G4721 Circadian rhythm sleep disorder, delayed sleep phase type: Secondary | ICD-10-CM | POA: Diagnosis not present

## 2024-07-09 DIAGNOSIS — F3341 Major depressive disorder, recurrent, in partial remission: Secondary | ICD-10-CM | POA: Diagnosis not present

## 2024-07-09 MED ORDER — METHYLPHENIDATE HCL 5 MG PO TABS: 5.0000 mg | ORAL_TABLET | Freq: Every day | ORAL | 0 refills | Status: DC

## 2024-07-09 MED ORDER — DULOXETINE HCL 30 MG PO CPEP: 30.0000 mg | ORAL_CAPSULE | Freq: Every day | ORAL | 2 refills | Status: DC

## 2024-07-09 NOTE — Patient Instructions (Signed)
 Thank you for attending your appointment today.  -- RESTART Cymbalta  at 30 mg daily -- Continue other medications as prescribed.  Please do not make any changes to medications without first discussing with your provider. If you are experiencing a psychiatric emergency, please call 911 or present to your nearest emergency department. Additional crisis, medication management, and therapy resources are included below.  [facility]  [address] [place], [place] [address] [phone] WALK-IN URGENT CARE 24/7 FOR ANYONE [address], [place], [place]  [phone] Fax: [phone] [email or web address] *Interpreters available *Accepts all insurance and uninsured for Urgent Care needs *Accepts Medicaid and uninsured for outpatient treatment (below)      ONLY FOR [facility]  Below:    Outpatient New Patient Assessment/Therapy Walk-ins:        Monday, Wednesday, and Thursday 8am until slots are full (first come, first served)                   New Patient Psychiatry/Medication Management        Monday-Friday 8am-11am (first come, first served)               For all walk-ins we ask that you arrive by 7:15am, because patients will be seen in the order of arrival.

## 2024-07-20 ENCOUNTER — Encounter: Payer: Self-pay | Admitting: Family Medicine

## 2024-07-20 ENCOUNTER — Ambulatory Visit: Payer: MEDICAID | Admitting: Family Medicine

## 2024-07-20 VITALS — BP 129/81 | HR 84 | Temp 98.0°F | Ht 69.0 in | Wt 278.6 lb

## 2024-07-20 DIAGNOSIS — K047 Periapical abscess without sinus: Secondary | ICD-10-CM | POA: Diagnosis not present

## 2024-07-20 DIAGNOSIS — J329 Chronic sinusitis, unspecified: Secondary | ICD-10-CM

## 2024-07-20 MED ORDER — AMOXICILLIN-POT CLAVULANATE 875-125 MG PO TABS: 1.0000 | ORAL_TABLET | Freq: Two times a day (BID) | ORAL | 0 refills | Status: DC

## 2024-07-20 MED ORDER — BACLOFEN 10 MG PO TABS: 10.0000 mg | ORAL_TABLET | Freq: Three times a day (TID) | ORAL | 0 refills | Status: AC

## 2024-07-20 NOTE — Progress Notes (Signed)
    SUBJECTIVE:   CHIEF COMPLAINT / HPI:   Sinus infection HA, sinus pressure, jaw and neck pain, cough, green thick snot. Ongoing a week. Has tried Advil , aspirin, nasal spray, netti pot.  Minimal relief. No N/V/C/D. No SoB. No fevers.  Tooth pain Left lower jaw.  He knows he has a bad tooth but does not currently have a dentist. He just got Medicaid and so now he is trying to find a dentist he will see him.  PERTINENT  PMH / PSH: Reviewed. Hypertension, allergic rhinitis, h/o ear and sinus infections  OBJECTIVE:   BP 129/81   Pulse 84   Temp 98 F (36.7 C)   Ht 5' 9 (1.753 m)   Wt 278 lb 9.6 oz (126.4 kg)   SpO2 98%   BMI 41.14 kg/m   General: Comfortable-appearing, no acute distress. HEENT: normocephalic, PERRLA, EOM grossly intact. MMM, no visible swelling or abscess on the left lower gumline, though the area is erythematous.  Bilateral TM visualized without erythema.  Cervical lymphadenopathy, tender. Pulm: Clear, no wheezing, no crackles. No increased work of breathing. Extremities: Moves all extremities equally. Neuro: Alert and oriented x3, speech normal in content, no facial asymmetry  Psych:  Cognition and judgment appear intact. Alert, communicative, and cooperative.   ASSESSMENT/PLAN:   Assessment & Plan Sinusitis, unspecified chronicity, unspecified location This is not quite been going on 10 days, however given his symptoms and potential dental infection as below I think it is reasonable to treat with antibiotics. - Augmentin  twice daily x 10 days -Strict return precautions given Tooth infection I am concerned about potential infection. I advised patient to see dentist as soon as possible; if he is not able to do this I strongly advised him to go to the emergency room. -Antibiotic coverage as above - List of dentists who accept Medicaid patients provided today - Again, strong encouragement today to connect with a dentist to soon as possible -  Strict return precautions given    Lauraine Norse, DO [facility]

## 2024-07-20 NOTE — Patient Instructions (Signed)
 I have sent in antibiotics to your pharmacy - please take these as prescribed. You can continue to take tylenol /ibuprofen  as needed to help with pain and inflammation. Please get an appointment to see a dentist as soon as possible - if you start having fevers, worsening pain, difficulty swallowing, or any other concerns please go to the emergency dept right away.   Dentist list - Medicaid, Adults  [facility]?  [phone]?  [address], [place], [place] [address]   [facility] - only for dentures or partials ?  [phone]?   [facility]?  [phone]?   [facility] ?  [phone]?  [address] [place], [place] [address]    [facility] Department may have services for Pregnant Women and adults age 43-21, call for more information.

## 2024-07-30 ENCOUNTER — Ambulatory Visit (HOSPITAL_COMMUNITY): Payer: MEDICAID | Admitting: Licensed Clinical Social Worker

## 2024-07-30 ENCOUNTER — Encounter: Payer: Self-pay | Admitting: Family Medicine

## 2024-07-30 ENCOUNTER — Encounter: Payer: Self-pay | Admitting: Neurology

## 2024-07-30 ENCOUNTER — Institutional Professional Consult (permissible substitution): Payer: Self-pay | Admitting: Neurology

## 2024-07-30 DIAGNOSIS — F840 Autistic disorder: Secondary | ICD-10-CM | POA: Diagnosis not present

## 2024-07-30 DIAGNOSIS — F3341 Major depressive disorder, recurrent, in partial remission: Secondary | ICD-10-CM

## 2024-07-30 NOTE — Progress Notes (Signed)
 THERAPIST PROGRESS NOTE  Virtual Visit via Video Note  I connected with Derrick Carey on 07/30/24 at  1:00 PM EDT by a video enabled telemedicine application and verified that I am speaking with the correct person using two identifiers.  Location: Patient: [facility]  Provider: Providers Home    I discussed the limitations of evaluation and management by telemedicine and the availability of in person appointments. The patient expressed understanding and agreed to proceed.   I discussed the assessment and treatment plan with the patient. The patient was provided an opportunity to ask questions and all were answered. The patient agreed with the plan and demonstrated an understanding of the instructions.   The patient was advised to call back or seek an in-person evaluation if the symptoms worsen or if the condition fails to improve as anticipated.  I provided 30 minutes of non-face-to-face time during this encounter.   Derrick GORMAN Patee, LCSW   Participation Level: Active  Behavioral Response: CasualAlertAnxious  Type of Therapy: Individual Therapy     07/30/2024    1:21 PM 04/05/2024   11:20 AM  GAD 7 : Generalized Anxiety Score  Nervous, Anxious, on Edge 3 3  Control/stop worrying 1 2  Worry too much - different things 3 3  Trouble relaxing 2 3  Restless 2 3  Easily annoyed or irritable 0 3  Afraid - awful might happen 1 3  Total GAD 7 Score 12 20  Anxiety Difficulty Somewhat difficult Extremely difficult        07/30/2024    1:24 PM 07/20/2024    2:43 PM 07/02/2024    1:38 PM  PHQ9 SCORE ONLY  PHQ-9 Total Score 7 5 4      Treatment Goals addressed:   ProgressTowards Goals: Progressing  Interventions: CBT, Supportive, and Reframing   Suicidal/Homicidal: Nowithout intent/plan  Therapist Response:   Derrick Carey was alert and oriented x 5.  He was pleasant, cooperative, maintained good eye contact.  He engaged well in therapy session and was dressed  casually.  Patient presented with flat and depressed mood\affect.  Derrick Carey reports stressors for illness.  This is as evidenced by himself reporting a sinus infection and abscess on one of his teeth.  Patient reports that he to try to advocate for himself and is following through with antibiotics for his sinus infection as well as looking for a dentist to remove the abscess his tooth.  Overall patient reports improved mental health this is as evidenced by GAD-7 and PHQ-9 both administered in today's session overall improving since originally taken during [facility] with this LCSW.     Other stressors for patient include political climate.  Patient reports frustration watching world news.  Patient states that he is trying to focus on the things he can control versus the things that he cannot control.  This would include limiting his world news intake as it has negatively affected his mental health.  Intervention/plan: LCSW utilized psychoanalytic therapy for patient to express thoughts, feelings and emotions and nonjudgmental environment.  LCSW utilized cognitive behavioral therapy for cognitive restructuring and education on cognitive distortions.  LCSW utilized motivational interviewing for open-ended questions and reflective listening.  LCSW utilized supportive therapy for praise and encouragement.  LCSW and patient discussed this LCSW leaving December 15.  LCSW notes that patient wants that patient has [facility] and would need to stay at [facility] due to insurance restrictions.  LCSW advised patient to new therapist would be starting  in mid November and that there are still 2 current therapist on staff that he we will have the ability to transfer to.  Derrick Carey verbally acknowledged plan to transfer to new therapist in 2 months.   Plan: Return again in 2 weeks.  Diagnosis: MDD (major depressive disorder), recurrent, in partial remission  Autism spectrum  disorder  Collaboration of Care: Other Continued individual    Patient/Guardian was advised Release of Information must be obtained prior to any record release in order to collaborate their care with an outside provider. Patient/Guardian was advised if they have not already done so to contact the registration department to sign all necessary forms in order for us  to release information regarding their care.   Consent: Patient/Guardian gives verbal consent for treatment and assignment of benefits for services provided during this visit. Patient/Guardian expressed understanding and agreed to proceed.   Derrick GORMAN Patee, LCSW 07/30/2024

## 2024-08-03 ENCOUNTER — Ambulatory Visit (INDEPENDENT_AMBULATORY_CARE_PROVIDER_SITE_OTHER): Payer: MEDICAID | Admitting: Student

## 2024-08-03 ENCOUNTER — Telehealth: Payer: Self-pay | Admitting: Family Medicine

## 2024-08-03 VITALS — BP 124/79 | HR 68 | Ht 69.0 in | Wt 280.0 lb

## 2024-08-03 DIAGNOSIS — K047 Periapical abscess without sinus: Secondary | ICD-10-CM | POA: Diagnosis not present

## 2024-08-03 DIAGNOSIS — K089 Disorder of teeth and supporting structures, unspecified: Secondary | ICD-10-CM

## 2024-08-03 DIAGNOSIS — J329 Chronic sinusitis, unspecified: Secondary | ICD-10-CM

## 2024-08-03 NOTE — Progress Notes (Unsigned)
    SUBJECTIVE:   CHIEF COMPLAINT / HPI:   33 year old male presenting today for follow-up of left jaw pain.  Reports he was recently treated with Augmentin  for 10 days for sinusitis.  Finished his days 3 days ago and to biotics due to continued pain.  He denies any fever, chills but endorses tooth ache.  He is working on scheduling an appointment to see a dentist about he is 2 weeks DKA.  Earlier this week he reported spontaneous rupture of pus pocket in his has buccal cavity.  Endorses continued postnasal drainage.  PERTINENT  PMH / PSH: Reviewed   OBJECTIVE:   BP 124/79   Pulse 68   Ht 5' 9 (1.753 m)   Wt 280 lb (127 kg)   SpO2 99%   BMI 41.35 kg/m    Physical Exam General: Alert, well appearing, NAD Cardiovascular: RRR, No Murmurs, Normal S2/S2 Respiratory: CTAB, No wheezing or Rales Oral: 2 decayed tooth to the left with surrounding edema and erythema. No tonsillar exudate or cervical lymphadenopathy. No notable dental abscess.   ASSESSMENT/PLAN:   No problem-specific Assessment & Plan notes found for this encounter.     Norleen April, MD [facility]   {    This will disappear when note is signed, click to select method of visit    :1}

## 2024-08-03 NOTE — Patient Instructions (Signed)
 Pleasure to see you today.  Suspect your continued pain is most likely due to infected tooth.  Please make sure to schedule an appointment with a dentist to have this issue fixed in order for any antibiotics to work effectively.  I have sent in prescription for Flonase  to help with the nasal drainage that you are currently having.  Please do Tylenol  or ibuprofen  for pain control as needed.

## 2024-08-03 NOTE — Telephone Encounter (Signed)
 After Hours Call:  Received after hours page. Pt and mom on phone together. Reports since coming home from his visit, he is developing a baseball sized knot on his cheek. Reports he is having trouble opening his mouth or swallowing because of the pain. Denies fevers, trouble breathing. Given size of growth, I recommended going to ED for further evaluation. They inquire if pt should double his dose of penicillin , advised that he should continue to take as prescribed. Family in agreement with plan.

## 2024-08-04 ENCOUNTER — Other Ambulatory Visit: Payer: Self-pay

## 2024-08-04 ENCOUNTER — Emergency Department (HOSPITAL_COMMUNITY)
Admission: EM | Admit: 2024-08-04 | Discharge: 2024-08-04 | Discharge status: Against Medical Advice | Payer: MEDICAID | Attending: Emergency Medicine | Admitting: Emergency Medicine

## 2024-08-04 ENCOUNTER — Encounter (HOSPITAL_COMMUNITY): Payer: Self-pay | Admitting: *Deleted

## 2024-08-04 DIAGNOSIS — Z5321 Procedure and treatment not carried out due to patient leaving prior to being seen by health care provider: Secondary | ICD-10-CM | POA: Insufficient documentation

## 2024-08-04 DIAGNOSIS — K0889 Other specified disorders of teeth and supporting structures: Secondary | ICD-10-CM | POA: Insufficient documentation

## 2024-08-04 MED ORDER — OXYCODONE-ACETAMINOPHEN 5-325 MG PO TABS
ORAL_TABLET | ORAL | Status: AC
  Filled 2024-08-04: qty 1

## 2024-08-04 MED ORDER — OXYCODONE-ACETAMINOPHEN 5-325 MG PO TABS
1.0000 | ORAL_TABLET | ORAL | Status: DC | PRN
  Administered 2024-08-04: 1 via ORAL

## 2024-08-04 NOTE — ED Notes (Signed)
 Pt decided to leave, I encouraged him to stay, he said no.

## 2024-08-04 NOTE — ED Notes (Signed)
 Pt was taken off floor after requesting to leave. Pt came back in ED asking for transportation to be called, [employer]. He was informed that he would have to be seen by a provider and have a formal discharge in order to receive transportation home. After pt was educated, he said he didn't know what to do and walked away.

## 2024-08-04 NOTE — ED Triage Notes (Signed)
 Toothache for several days with swollen lt face

## 2024-08-04 NOTE — Assessment & Plan Note (Signed)
 Suspect reinfection due to poor dentition. Oral exam shows inflammation surrounding multiple teeth. No notable abscess on exam. Recently completed 10 day course of augment. Will send in prescription for Penicillin  VK 500mg  QID for 5 days. Informed patient this will continue to reoccur until is dental issues are managed by a dentist. Patient is currently working on setting an appointment with a dentist.

## 2024-08-04 NOTE — ED Triage Notes (Signed)
 Dental pain x 1 week, was brushing his teeth today and popped something in his mouth. Reports purulent and bloody drainage at that time with swelling progressing from lower jaw up face

## 2024-08-13 ENCOUNTER — Telehealth (HOSPITAL_COMMUNITY): Payer: Self-pay | Admitting: Licensed Clinical Social Worker

## 2024-08-13 ENCOUNTER — Encounter (HOSPITAL_COMMUNITY): Payer: Self-pay

## 2024-08-13 ENCOUNTER — Ambulatory Visit (HOSPITAL_COMMUNITY): Payer: MEDICAID | Admitting: Licensed Clinical Social Worker

## 2024-08-13 NOTE — Telephone Encounter (Signed)
 LCSW sent two link to pt phone number listed in epic with no response. Pt had 1pm appointment for f/u therapy session. LCSW called to see if pt was have technical difficulties. LCSW let VM and stayed in link until 1320 before disconnecting. Pt to be marked as a no show for today's session.

## 2024-08-14 ENCOUNTER — Institutional Professional Consult (permissible substitution): Payer: MEDICAID | Admitting: Neurology

## 2024-08-29 NOTE — Progress Notes (Signed)
 BH MD Outpatient Progress Note  08/30/2024 2:28 PM STAR CHEESE  MRN:  [id number]  Assessment:  Derrick Carey presents for follow-up evaluation. Today, 08/30/24, patient reports continued benefit from Ritalin  for attention and organization with overall tolerability. He endorses episodes of low mood and irritability impacted by anniversary of his aunt's passing and other situational stressors but denies persistence of these symptoms. No acute safety concerns. He reports improvement in sleep with return to normal sleep schedule. He was encouraged to reschedule appointment with sleep medicine for further evaluation of snoring and daytime fatigue. He reports unclear benefit from Cymbalta  for mood and pain although identifies only intermittent adherence; explored strategies for promoting daily adherence to fully ascertain benefit and tolerability. If adherence remains an issue, he may benefit from transition to alternative medication such as Prozac due to longer half life. No changes to regimen at this time.   Patient was made aware of this provider's departure from [facility] at the end of Nov 2025 and that he will be transitioned to alternative provider in the clinic after this time. All questions/concerns addressed.  RTC in 2 months with next provider.  Identifying Information: Derrick Carey is a 33 y.o. male with a history of autism spectrum disorder, ADHD, HTN, and fibromyalgia who is an established patient with [facility]. Patient was formally diagnosed with ASD and ADHD in 3rd grade by psychologist; these diagnoses were felt to be corroborated on initial assessment. While there has been report of AVH in the chart, on further exploration these experiences are not felt to represent symptoms of primary psychosis but rather perceptual disturbances and intrusive thoughts during periods of sensory overload and intense emotional distress. He describes that intrusive thoughts of  harm to self are infrequent and fleeting and that he is able to successfully visualize throwing them away. He shows significant insight into these experiences as being borne out of place of overwhelm.  Plan:  # ASD  ADHD Past medication trials: Ritalin , Adderall, Concerta   Status of problem: chronic; ADHD - improving Interventions: -- Continue Ritalin  5 mg qAM (s7/24/25) -- Utox negative 04/30/24 -- BP and HR wnl 08/04/24   # History of MDD, recurrent Past medication trials: Lexapro  Status of problem: stable Interventions: -- Continue Cymbalta  30 mg daily; counseled on importance of daily adherence and recommended use of pill box and medication alarm  -- Patient recently started on Tramadol  by PM&R for pain management; risks/benefits of this medication including DDI and signs/sx of serotonin syndrome discussed. Continue careful monitoring and conservative titration as indicated. -- Continue pain management through PM&R -- Continue individual psychotherapy with Prentice Goldammer, LCSW   # Reported snoring, vivid dreams, daytime fatigue # Delayed sleep phase Status of problem: new problem to this provider Interventions: -- Referral to sleep medicine previously placed in July; encouraged patient to return their phone call to get rescheduled -- Previously counseled on sleep hygiene and gradually shifting bedtime earlier every few days  Patient was given contact information for behavioral health clinic and was instructed to call 911 for emergencies.   Subjective:  Chief Complaint:  Chief Complaint  Patient presents with   Medication Management    Interval History:   Patient reports he is hanging in there - denies major changes or updates since last appointment.   He restarted Cymbalta  at 30 mg daily; taking it about a few times a week. Denies further GI issues. Identifies recovery from recent dental infection has impacted adherence. Taking Ritalin   daily and finds it helpful for  keeping brain in check and overall focus. Denies adverse effects including HA, appetite suppression, chest pain, tachycardia.  Sleeping better this past week due to adhering to a better schedule; getting about 8-9 hours nightly. Was scheduled for sleep medicine appointment but was canceled as provider was sick; still needs to reschedule. Provided with number today.   Continues to take tramadol  for pain; pain has been better controlled.   Describes mood overall as a bit more frustrated than usual which he relates to this time of year - reflects on anniversary of aunt's passing in December. Reports intact interest and enjoyment in watching TV; playing video games. Goes for occasional walks with dad outside. Reports continued passive SI in the back of my skull but identifies these as intrusive and able to dismiss them. Denies active SI. Denies HI; occasional aggressive urges when frustrated but finds it very easy to dismiss these thoughts. Reports occasional visual illusions of moving shadows but denies overt AVH. Feels safe in the home.  Amenable to focusing on daily adherence to Cymbalta  to fully ascertain benefit and tolerability. Explored strategies for promoting medication adherence such as pill box.  No questions/concerns at this time.   PDMP: -- Methylphenidate  5 mg tablet QTY 30 last filled 07/09/24   Visit Diagnosis:    ICD-10-CM   1. Attention deficit hyperactivity disorder (ADHD), combined type  F90.2     2. Autism spectrum disorder  F84.0     3. At risk for obstructive sleep apnea  Z91.89     4. MDD (major depressive disorder), recurrent episode, moderate (HCC)  F33.1        Past Psychiatric History:  Diagnoses: autism spectrum disorder, ADHD combined type (diagnosed with ASD and ADHD by psychologist in 3rd grade) Medication trials: Lexapro , Ritalin , Adderall, Concerta   Previous psychiatrist/therapist: x1 in childhood when expressing SI in elementary  school Hospitalizations: denies Suicide attempts: denies SIB: reports he may hit himself in the head when hand when overwhelmed; denies other self harm behaviors Hx of violence towards others: denies Current access to guns: denies Hx of trauma/abuse: denies Substance use:              -- Etoh: 1 rum and coke about 5 times a year             -- Denies use of illicit drugs including cannabis/CBD/THC             -- Tobacco: denies  Past Medical History:  Past Medical History:  Diagnosis Date   ADHD (attention deficit hyperactivity disorder)    Autism spectrum disorder    Chronic cough 08/12/2011   Onset ? 06/2019 - ENT eval per [facility] neg 11/15/19 included neg sinus ct -Allergy profile  05/19/20  >  Eos 0.2 /  IgE  15  - 05/19/2020 some better p max gerd/ 1st gen H1 blockers per guidelines  > add gabapentin  100 tid  - 07/14/2020 increased gabapentin  to 300 mg tid and  h1 p supper and hs only  (otherwise ? Make him too sleepy in daytime)    Depression    Dry skin 02/08/2012   1 mth hx of dry skin in back of right ear.   Allergic dermatitis vs contact dermatitis vs eczma   GERD (gastroesophageal reflux disease)    Lateral epicondylitis of right elbow 06/14/2018   Papule of skin 06/19/2015   Passive suicidal ideations 08/10/2022   Right arm pain 08/01/2018   Sinusitis,  acute maxillary 11/26/2013    Past Surgical History:  Procedure Laterality Date   LACERATION REPAIR     in elementary school X 3    Family Psychiatric History:  Maternal aunt: depression Paternal uncle: died by suicide  Family History:  Family History  Problem Relation Age of Onset   Hyperlipidemia Mother    Hypertension Mother    Congenital heart disease Father    Heart attack Father    Arthritis Father    Liver disease Father        Fatty Liver   Hypertension Father    Congestive Heart Failure Father    Heart disease Father        Heart Attacks 3    Social History:  Academic/Vocational: completed 12th  grade - had IEP; 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 - employment disrupted by complications from [employer]    Social History   Socioeconomic History   Marital status: Single    Spouse name: Not on file   Number of children: 0   Years of education: Not on file   Highest education level: Not on file  Occupational History   Not on file  Tobacco Use   Smoking status: Former    Current packs/day: 0.50    Average packs/day: 0.5 packs/day for 1 year (0.5 ttl pk-yrs)    Types: Cigarettes    Passive exposure: Past   Smokeless tobacco: Never  Vaping Use   Vaping status: Never Used  Substance and Sexual Activity   Alcohol use: Yes    Comment: rarely   Drug use: No   Sexual activity: Not Currently  Other Topics Concern   Not on file  Social History Narrative   [employer] - Lives at home with Mom and Dad         Right Handed    Lives in a one story home    Social Drivers of Health   Financial Resource Strain: Medium Risk (04/05/2024)   Overall Financial Resource Strain (CARDIA)    Difficulty of Paying Living Expenses: Somewhat hard  Food Insecurity: No Food Insecurity (04/05/2024)   Hunger Vital Sign    Worried About Running Out of Food in the Last Year: Never true    Ran Out of Food in the Last Year: Never true  Transportation Needs: No Transportation Needs (04/05/2024)   PRAPARE - Administrator, Civil Service (Medical): No    Lack of Transportation (Non-Medical): No  Physical Activity: Insufficiently Active (04/05/2024)   Exercise Vital Sign    Days of Exercise per Week: 4 days    Minutes of Exercise per Session: 10 min  Stress: Stress Concern Present (04/05/2024)   [employer] of Occupational Health - Occupational Stress Questionnaire    Feeling of Stress: Very much  Social Connections: Socially Isolated (04/05/2024)   Social Connection and Isolation Panel    Frequency of Communication with Friends and Family: Never    Frequency of Social  Gatherings with Friends and Family: Never    Attends Religious Services: Never    Database Administrator or Organizations: No    Attends Banker Meetings: Never    Marital Status: Never married    Allergies:  Allergies  Allergen Reactions   Sulfa Antibiotics Rash    Current Medications: Current Outpatient Medications  Medication Sig Dispense Refill   traMADol  (ULTRAM ) 50 MG tablet Take 1 tablet (50 mg total) by mouth every 12 (twelve) hours as needed. 60  tablet 1   baclofen  (LIORESAL ) 10 MG tablet Take 1 tablet (10 mg total) by mouth 3 (three) times daily. 90 tablet 0   diclofenac  Sodium (VOLTAREN  ARTHRITIS PAIN) 1 % GEL Apply 4 g topically 4 (four) times daily. 150 g 3   DULoxetine  (CYMBALTA ) 30 MG capsule Take 1 capsule (30 mg total) by mouth daily. 30 capsule 2   fluticasone  (FLONASE ) 50 MCG/ACT nasal spray Place 2 sprays into both nostrils daily. 16 g 6   ibuprofen  (ADVIL ,MOTRIN ) 800 MG tablet Take 1 tablet (800 mg total) by mouth every 8 (eight) hours as needed. 30 tablet 0   lisinopril  (ZESTRIL ) 20 MG tablet Take 1 tablet (20 mg total) by mouth at bedtime. 90 tablet 3   methylphenidate  (RITALIN ) 5 MG tablet Take 1 tablet (5 mg total) by mouth daily. 30 tablet 0   [START ON 09/29/2024] methylphenidate  (RITALIN ) 5 MG tablet Take 1 tablet (5 mg total) by mouth daily. 30 tablet 0   [START ON 10/29/2024] methylphenidate  (RITALIN ) 5 MG tablet Take 1 tablet (5 mg total) by mouth daily. 30 tablet 0   pregabalin  (LYRICA ) 100 MG capsule Take 1 capsule (100 mg total) by mouth 2 (two) times daily. Take 150mg  twice a day for 3 weeks. If pain is not improved and tolerating in increase to three times a day 60 capsule 3   No current facility-administered medications for this visit.    ROS: See above  Objective:  Psychiatric Specialty Exam: There were no vitals taken for this visit.There is no height or weight on file to calculate BMI.  General Appearance: Casual and Fairly  Groomed; hair long  Eye Contact:  Good  Speech:  Clear and Coherent, Normal Rate, and stilted  Volume:  Normal  Mood:  hanging in there  Affect:  Euthymic; calm  Thought Content: Denies AVH; no overt delusional thought content   Suicidal Thoughts:  Reports intrusive passive SI; denies active SI  Homicidal Thoughts:  No  Thought Process:  Goal Directed and Linear  Orientation:  Full (Time, Place, and Person)    Memory:  Grossly intact   Judgment:  Good  Insight:  Good  Concentration:  Concentration: intact to interview  Recall:  not formally [employer] of Knowledge: Good  Language: Good  Psychomotor Activity:  Normal  Akathisia:  No  AIMS (if indicated): not done  Assets:  Communication Skills Desire for Improvement Housing Leisure Time Social Support Talents/Skills  ADL's:  Intact  Cognition: WNL  Sleep:  improving   PE: General: sits comfortably in view of camera; no acute distress  Pulm: no increased work of breathing on room air  MSK: all extremity movements appear intact Neuro: no focal neurological deficits observed  Gait & Station: unable to assess by video    Metabolic Disorder Labs: Lab Results  Component Value Date   HGBA1C 5.0 08/30/2023   No results found for: PROLACTIN Lab Results  Component Value Date   CHOL 211 (H) 12/14/2023   TRIG 219 (H) 12/14/2023   HDL 28 (L) 12/14/2023   CHOLHDL 7.5 (H) 12/14/2023   VLDL 50 (H) 12/15/2015   LDLCALC 143 (H) 12/14/2023   LDLCALC 132 (H) 05/17/2023   Lab Results  Component Value Date   TSH 1.250 12/14/2023   TSH 1.08 10/29/2022    Therapeutic Level Labs: No results found for: LITHIUM No results found for: VALPROATE No results found for: CBMZ  Screenings:  GAD-7    Advertising Copywriter from  07/30/2024 in [facility] Counselor from 04/05/2024 in [facility]  Total GAD-7 Score 12 20   PHQ2-9    Flowsheet Row Counselor from  07/30/2024 in [facility] Office Visit from 07/20/2024 in [facility] [place] Office Visit from 07/02/2024 in [facility] Office Visit from 04/30/2024 in [facility] Counselor from 04/05/2024 in [facility]  PHQ-2 Total Score 2 2 4 1 6   PHQ-9 Total Score 7 5 -- 3 25   Flowsheet Row ED from 08/04/2024 in [facility] Emergency Department at [facility] Counselor from 04/05/2024 in [facility] ED from 08/03/2022 in [facility] Emergency Department at [facility]  C-SSRS RISK CATEGORY No Risk Moderate Risk No Risk    Collaboration of Care: Collaboration of Care: Medication Management AEB active medication management and Psychiatrist AEB established with this provider  Patient/Guardian was advised Release of Information must be obtained prior to any record release in order to collaborate their care with an outside provider. Patient/Guardian was advised if they have not already done so to contact the registration department to sign all necessary forms in order for us  to release information regarding their care.   Consent: Patient/Guardian gives verbal consent for treatment and assignment of benefits for services provided during this visit. Patient/Guardian expressed understanding and agreed to proceed.   Televisit via video: I connected with patient on 08/30/24 at  2:00 PM EST by a video enabled telemedicine application and verified that I am speaking with the correct person using two identifiers.  Location: Patient: home address in [place] Provider: remote office in [place]   I discussed the limitations of evaluation and management by telemedicine and the availability of in person appointments. The patient expressed understanding and agreed to proceed.  I discussed the assessment and  treatment plan with the patient. The patient was provided an opportunity to ask questions and all were answered. The patient agreed with the plan and demonstrated an understanding of the instructions.   The patient was advised to call back or seek an in-person evaluation if the symptoms worsen or if the condition fails to improve as anticipated.  I provided 35 minutes dedicated to the care of this patient via video on the date of this encounter to include chart review, face-to-face time with the patient, medication management/counseling, documentation, brief supportive psychotherapy.  Mariha Sleeper A Cleota Pellerito 08/30/2024, 2:28 PM

## 2024-08-30 ENCOUNTER — Telehealth (HOSPITAL_COMMUNITY): Payer: MEDICAID | Admitting: Psychiatry

## 2024-08-30 ENCOUNTER — Encounter (HOSPITAL_COMMUNITY): Payer: Self-pay | Admitting: Psychiatry

## 2024-08-30 DIAGNOSIS — F331 Major depressive disorder, recurrent, moderate: Secondary | ICD-10-CM | POA: Diagnosis not present

## 2024-08-30 DIAGNOSIS — F840 Autistic disorder: Secondary | ICD-10-CM | POA: Diagnosis not present

## 2024-08-30 DIAGNOSIS — F902 Attention-deficit hyperactivity disorder, combined type: Secondary | ICD-10-CM

## 2024-08-30 DIAGNOSIS — Z9189 Other specified personal risk factors, not elsewhere classified: Secondary | ICD-10-CM | POA: Diagnosis not present

## 2024-08-30 MED ORDER — METHYLPHENIDATE HCL 5 MG PO TABS: 5.0000 mg | ORAL_TABLET | Freq: Every day | ORAL | 0 refills | Status: AC

## 2024-08-30 MED ORDER — DULOXETINE HCL 30 MG PO CPEP: 30.0000 mg | ORAL_CAPSULE | Freq: Every day | ORAL | 2 refills | Status: AC

## 2024-08-30 NOTE — Patient Instructions (Addendum)
 Thank you for attending your appointment today.  -- We did not make any medication changes today. Please continue medications as prescribed. -- Use a pill box and set a medication alarm to remind you to take your medications every day.  Please do not make any changes to medications without first discussing with your provider. If you are experiencing a psychiatric emergency, please call 911 or present to your nearest emergency department. Additional crisis, medication management, and therapy resources are included below.  [facility]  [address] [place], [place] [address] [phone] WALK-IN URGENT CARE 24/7 FOR ANYONE [address], [place], [place]  [phone] Fax: [phone] [email or web address] *Interpreters available *Accepts all insurance and uninsured for Urgent Care needs *Accepts Medicaid and uninsured for outpatient treatment (below)      ONLY FOR [facility]  Below:    Outpatient New Patient Assessment/Therapy Walk-ins:        Monday, Wednesday, and Thursday 8am until slots are full (first come, first served)                   New Patient Psychiatry/Medication Management        Monday-Friday 8am-11am (first come, first served)               For all walk-ins we ask that you arrive by 7:15am, because patients will be seen in the order of arrival.

## 2024-08-31 ENCOUNTER — Encounter: Payer: MEDICAID | Attending: Physical Medicine & Rehabilitation | Admitting: Physical Medicine & Rehabilitation

## 2024-08-31 ENCOUNTER — Encounter: Payer: Self-pay | Admitting: Physical Medicine & Rehabilitation

## 2024-08-31 VITALS — BP 122/75 | HR 84 | Ht 69.0 in | Wt 277.4 lb

## 2024-08-31 DIAGNOSIS — G894 Chronic pain syndrome: Secondary | ICD-10-CM | POA: Diagnosis present

## 2024-08-31 DIAGNOSIS — M797 Fibromyalgia: Secondary | ICD-10-CM | POA: Insufficient documentation

## 2024-08-31 DIAGNOSIS — F32A Depression, unspecified: Secondary | ICD-10-CM | POA: Diagnosis present

## 2024-08-31 MED ORDER — TRAMADOL HCL 50 MG PO TABS: 50.0000 mg | ORAL_TABLET | Freq: Three times a day (TID) | ORAL | 3 refills | Status: AC | PRN
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
## 2024-09-03 ENCOUNTER — Ambulatory Visit (INDEPENDENT_AMBULATORY_CARE_PROVIDER_SITE_OTHER): Payer: MEDICAID | Admitting: Licensed Clinical Social Worker

## 2024-09-03 DIAGNOSIS — F331 Major depressive disorder, recurrent, moderate: Secondary | ICD-10-CM

## 2024-09-03 DIAGNOSIS — F840 Autistic disorder: Secondary | ICD-10-CM

## 2024-09-03 NOTE — Progress Notes (Signed)
 THERAPIST PROGRESS NOTE  Session Time: 54  Participation Level: Active  Behavioral Response: CasualAlertAnxious and Depressed  Type of Therapy: Individual Therapy  Treatment Goals addressed:  Active     OP Depression     LTG: Reduce frequency, intensity, and duration of depression symptoms so that daily functioning is improved (Progressing)     Start:  04/05/24    Expected End:  10/19/24         LTG: Increase coping skills to manage depression and improve ability to perform daily activities (Progressing)     Start:  04/05/24    Expected End:  10/19/24         LTG: [name] will score less than 10 on the Patient Health Questionnaire (PHQ-9)  (Progressing)     Start:  04/05/24    Expected End:  10/19/24         STG: Derrick Carey will participate in at least 80% of scheduled individual psychotherapy sessions  (Progressing)     Start:  04/05/24    Expected End:  10/19/24         STG: Derrick Carey will complete at least 80% of assigned homework  (Progressing)     Start:  04/05/24    Expected End:  10/19/24         Work with Derrick Carey to identify the major components of a recent episode of depression: physical symptoms, major thoughts and images, and major behaviors they experienced     Start:  04/05/24         Work with Derrick Carey to track symptoms, triggers, and/or skill use through a mood chart, diary card, or journal (Completed)     Start:  04/05/24    End:  05/29/24      Encourage Derrick Carey to participate in recovery peer support activities weekly  (Completed)     Start:  04/05/24    End:  05/29/24      Therapist will administer the PHQ-9 at weekly intervals for the next 10 weeks (Completed)     Start:  04/05/24    End:  05/29/24      Provide Derrick Carey educational information and reading material on dissociation, its causes, and symptoms (Completed)     Start:  04/05/24    End:  05/29/24          ProgressTowards Goals: Progressing  Interventions: CBT, Motivational Interviewing, and  Supportive    Suicidal/Homicidal: Nowithout intent/plan  Therapist Response:   S: Subjective Derrick Carey was alert and oriented 5. He was pleasant, cooperative, maintained good eye contact, and engaged well in the therapy session. He reported experiencing ongoing negative thoughts, primarily focused on comparing his past functioning to his current limitations related to fibromyalgia pain. Derrick Carey shared that he continues to advocate for himself and has a follow-up appointment scheduled with his orthopedic specialist. He expressed concern that his current medications may need dosage adjustments or consideration of new options due to increasing pain. Derrick Carey discussed feeling frustrated, particularly given his role as the primary caregiver for his parents. He noted that his parents are supportive and encourage him to focus on positive aspects of his life. O: Objective Derrick Carey presented with an anxious and depressed mood and congruent affect. He was casually dressed, cooperative, and demonstrated appropriate engagement throughout the session. No safety concerns were noted. A: Assessment Derrick Carey continues to struggle with anxiety and depressive symptoms related to chronic pain, role strain as a caregiver, and negative self-comparisons. He is appropriately seeking medical follow-up to address pain management and  remains motivated to engage in therapeutic interventions. P: Plan   Completed psychoeducation on gratitude practices; provided a worksheet with examples.   Assigned Derrick Carey to utilize the gratitude worksheet at least once weekly.   Utilized CBT techniques for cognitive restructuring and reframing.   Provided supportive therapy for praise and encouragement.   Engaged in psychodynamic exploration to allow expression of thoughts, feelings, and emotions in a nonjudgmental environment.   Continue therapy with ongoing focus on pain-related coping, cognitive restructuring, and caregiver-role  stress.   Follow up at next scheduled session.     Plan: Return again in 4 weeks.  Diagnosis: Autism spectrum disorder  MDD (major depressive disorder), recurrent episode, moderate (HCC)  Collaboration of Care: Other continued mental health treatment at [facility]   Patient/Guardian was advised Release of Information must be obtained prior to any record release in order to collaborate their care with an outside provider. Patient/Guardian was advised if they have not already done so to contact the registration department to sign all necessary forms in order for us  to release information regarding their care.   Consent: Patient/Guardian gives verbal consent for treatment and assignment of benefits for services provided during this visit. Patient/Guardian expressed understanding and agreed to proceed.   Derrick Carey GORMAN Patee, LCSW 09/03/2024

## 2024-09-04 ENCOUNTER — Ambulatory Visit (HOSPITAL_BASED_OUTPATIENT_CLINIC_OR_DEPARTMENT_OTHER): Payer: MEDICAID | Admitting: Physical Therapy

## 2024-09-11 ENCOUNTER — Telehealth: Payer: Self-pay

## 2024-09-11 NOTE — Telephone Encounter (Signed)
 [place] Tramadol  approved 08/18/24-03/04/25

## 2024-09-27 ENCOUNTER — Ambulatory Visit (INDEPENDENT_AMBULATORY_CARE_PROVIDER_SITE_OTHER): Payer: MEDICAID | Admitting: Licensed Clinical Social Worker

## 2024-09-27 DIAGNOSIS — F840 Autistic disorder: Secondary | ICD-10-CM

## 2024-09-27 DIAGNOSIS — F331 Major depressive disorder, recurrent, moderate: Secondary | ICD-10-CM | POA: Diagnosis not present

## 2024-09-27 NOTE — Progress Notes (Signed)
 THERAPIST PROGRESS NOTE  Virtual Visit via Video Note  I connected with Derrick Carey on 09/27/2024 at  2:00 PM EST by a video enabled telemedicine application and verified that I am speaking with the correct person using two identifiers.  Location: Patient: Derrick Carey  Provider: Providers Home    I discussed the limitations of evaluation and management by telemedicine and the availability of in person appointments. The patient expressed understanding and agreed to proceed.  I discussed the assessment and treatment plan with the patient. The patient was provided an opportunity to ask questions and all were answered. The patient agreed with the plan and demonstrated an understanding of the instructions.   The patient was advised to call back or seek an in-person evaluation if the symptoms worsen or if the condition fails to improve as anticipated.  I provided 45 minutes of non-face-to-face time during this encounter.   Derrick GORMAN Patee, Derrick Carey   Participation Level: Active  Behavioral Response: CasualAlertAnxious and Depressed  Type of Therapy: Individual Therapy  Treatment Goals addressed:  Active     OP Depression     LTG: Reduce frequency, intensity, and duration of depression symptoms so that daily functioning is improved (Progressing)     Start:  04/05/24    Expected End:  10/19/24         LTG: Increase coping skills to manage depression and improve ability to perform daily activities (Progressing)     Start:  04/05/24    Expected End:  10/19/24         LTG: Derrick Carey will score less than 10 on the Patient Health Questionnaire (PHQ-9)  (Progressing)     Start:  04/05/24    Expected End:  10/19/24         STG: Derrick will participate in at least 80% of scheduled individual psychotherapy sessions  (Progressing)     Start:  04/05/24    Expected End:  10/19/24         STG: Derrick will complete at least 80% of assigned homework  (Progressing)     Start:  04/05/24     Expected End:  10/19/24         Work with Derrick to track symptoms, triggers, and/or skill use through a mood chart, diary card, or journal (Completed)     Start:  04/05/24    End:  05/29/24      Encourage Derrick Carey to participate in recovery peer support activities weekly  (Completed)     Start:  04/05/24    End:  05/29/24      Therapist will administer the PHQ-9 at weekly intervals for the next 10 weeks (Completed)     Start:  04/05/24    End:  05/29/24      Provide Derrick Carey educational information and reading material on dissociation, its causes, and symptoms (Completed)     Start:  04/05/24    End:  05/29/24      Work with Derrick to identify the major components of a recent episode of depression: physical symptoms, major thoughts and images, and major behaviors they experienced (Completed)     Start:  04/05/24    End:  09/27/24         ProgressTowards Goals: Progressing  Interventions: CBT, Motivational Interviewing, and Supportive  Suicidal/Homicidal: Nowithout intent/plan  Therapist Response:   S - Subjective  Derrick Carey was alert and oriented 5, pleasant, cooperative, and maintained good eye contact. He reported feeling physically unwell for the past  two weeks, describing ongoing congestion, cough, fatigue, and body aches. He expressed frustration with the lack of improvement and stated he intends to follow up with a medical provider. He noted spending most of his time in bed due to fatigue.  Derrick Carey reported he has not been taking his psychiatric medications while ill and acknowledged decreased motivation and energy. He verbalized understanding after Derrick Carey educated him on the importance of medication consistency, even during illness.  Regarding mental health symptoms, Derrick Carey reported progress in reducing rapid thoughts, suicidal ideations, and feelings of being overwhelmed. He disclosed that in social settings he sometimes becomes overwhelmed and feels like he wants to hurt himself or  others; however, today he denied any current suicidal or homicidal ideation.  O - Objective  Alert and oriented 5  Pleasant, cooperative; appropriate eye contact  Casually dressed  Mood: Flat (consistent with physical illness)  Affect: Appropriate  Engaged to the best of his ability despite reported fatigue  No SI/HI observed or reported  A - Assessment  Derrick Carey is physically ill and experiencing fatigue that has contributed to decreased functioning and inconsistent medication adherence. He demonstrates insight into the need for medical follow-up. His psychiatric symptoms have shown improvement overall, though social overwhelm remains a trigger for distress. He benefitted from strength-based reflection, education on avoidance cycles, and coping-skill reinforcement. No acute safety concerns noted at this time.  P - Plan  Encourage patient to contact his medical provider for evaluation due to unresolved symptoms lasting >2 weeks.  Reinforce medication adherence and discuss risks of abruptly stopping psychiatric medications.  Facilitate transfer to a new therapist at [facility] due to [facility]; patient agreeable.  Utilize strength-based therapy to review progress in reducing rapid thoughts, SI, and feelings of overwhelm.  Provided psychoeducation on the cycle of avoidance; reviewed one-third of the confronting avoidance worksheet during session.  Encourage patient to recognize emotions and evaluate behavioral responses (e.g., yelling, breaking items).  Reinforce coping skills: imagery, stress balls, temperature change (TIPP), intensive exercise, PMR, and paced breathing.  Monitor safety and symptom stability at next session.  Plan: Return again in 3 weeks.  Diagnosis: Autism spectrum disorder  MDD (major depressive disorder), recurrent episode, moderate (HCC)  Collaboration of Care: Other patient to transfer to new therapist at [facility] due to having [employer].  Patient to remain at [facility] for psychiatric medication management.  Patient/Guardian was advised Release of Information must be obtained prior to any record release in order to collaborate their care with an outside provider. Patient/Guardian was advised if they have not already done so to contact the registration department to sign all necessary forms in order for us  to release information regarding their care.   Consent: Patient/Guardian gives verbal consent for treatment and assignment of benefits for services provided during this visit. Patient/Guardian expressed understanding and agreed to proceed.   Derrick GORMAN Patee, Derrick Carey 09/27/2024

## 2024-09-28 ENCOUNTER — Ambulatory Visit: Payer: MEDICAID | Admitting: Family Medicine

## 2024-09-28 ENCOUNTER — Encounter: Payer: Self-pay | Admitting: Family Medicine

## 2024-09-28 VITALS — BP 124/79 | HR 72 | Temp 97.9°F | Wt 275.6 lb

## 2024-09-28 DIAGNOSIS — R0981 Nasal congestion: Secondary | ICD-10-CM

## 2024-09-28 DIAGNOSIS — J019 Acute sinusitis, unspecified: Secondary | ICD-10-CM

## 2024-09-28 LAB — POC SOFIA 2 FLU + SARS ANTIGEN FIA
Influenza A, POC: NEGATIVE
Influenza B, POC: NEGATIVE
SARS Coronavirus 2 Ag: NEGATIVE

## 2024-09-28 MED ORDER — BENZONATATE 100 MG PO CAPS: 100.0000 mg | ORAL_CAPSULE | Freq: Two times a day (BID) | ORAL | 0 refills | Status: AC | PRN

## 2024-09-28 MED ORDER — AMOXICILLIN-POT CLAVULANATE 875-125 MG PO TABS: 1.0000 | ORAL_TABLET | Freq: Two times a day (BID) | ORAL | 0 refills | Status: AC

## 2024-09-28 NOTE — Progress Notes (Signed)
° ° °  SUBJECTIVE:   CHIEF COMPLAINT / HPI:   Sick symptoms Started 2 weeks ago. Cough, runny nose/postnasal drip, sore throat. Subjective fevers ongoing, feels like he has chills. Cough keeps him up at night, Concerned he is developing PNA, cobwebs in my chest. Also concerned he may have ear infection. Has been using OTC cold/flu medicine and cough medicine with some relief. No one else at home sick.  PERTINENT  PMH / PSH: Reviewed. HTN, ASD, MDD  OBJECTIVE:   BP 124/79   Pulse 72   Temp 97.9 F (36.6 C)   Wt 275 lb 9.6 oz (125 kg)   SpO2 96%   BMI 40.70 kg/m   General: ill-appearing, no acute distress. HEENT: normocephalic, PERRLA, EOM grossly intact, MMM, bilateral TM visualized without erythema or bulging. Cardio: Regular rate, regular rhythm, no murmurs on exam. Pulm: Clear, no wheezing, no crackles. No increased work of breathing. Extremities: no peripheral edema. Moves all extremities equally. Neuro: Alert and oriented x3, speech normal in content, no facial asymmetry Psych:  Cognition and judgment appear intact.    ASSESSMENT/PLAN:   Assessment & Plan Acute non-recurrent sinusitis, unspecified location Negative for Covid/Flu in clinic today. No focal lung findings on exam suspicious for PNA; no evidence of AOM. Given duration >10 days will treat for sinusitis. - Augmentin  BID x10 days - tessalon  perles BID PRN for cough - continue supportive care with OTC medicines, good hydration, plenty of rest; if not improving by next week he will return to care.     Lauraine Norse, DO [facility]

## 2024-09-28 NOTE — Patient Instructions (Addendum)
 Start taking your antibiotics tonight. You will take this twice a day for 10 days. You can also use tessalon  perles for cough. Continue using OTC medicines as needed for fever, discomfort, etc. Make sure you stay well-hydrated even if you have no appetite. If you are not feeling better by Monday please return to our clinic.

## 2024-10-08 NOTE — Therapy (Incomplete)
 " OUTPATIENT PHYSICAL THERAPY THORACOLUMBAR EVALUATION   Patient Name: Derrick Carey MRN: [id number] DOB:May 29, 1991, 33 y.o., male Today's Date: 10/08/2024  END OF SESSION:   Past Medical History:  Diagnosis Date   ADHD (attention deficit hyperactivity disorder)    Autism spectrum disorder    Chronic cough 08/12/2011   Onset ? 06/2019 - ENT eval per [facility] neg 11/15/19 included neg sinus ct -Allergy profile  05/19/20  >  Eos 0.2 /  IgE  15  - 05/19/2020 some better p max gerd/ 1st gen H1 blockers per guidelines  > add gabapentin  100 tid  - 07/14/2020 increased gabapentin  to 300 mg tid and  h1 p supper and hs only  (otherwise ? Make him too sleepy in daytime)    Depression    Dry skin 02/08/2012   1 mth hx of dry skin in back of right ear.   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  ONSET DATE: ***  SUBJECTIVE:                                                                                                                                                                                            SUBJECTIVE STATEMENT: ***  PERTINENT HISTORY:  ***  PAIN:  Are you having pain? Yes Pain location: thoracic, neck, shoulder, and bil UE pain NPRS scale:  6/10 to 10/10 avg 8 Aggravating factors: weather changes, movement Relieving factors: hot shower Pain description: sharp, dull, and aching Stage: Chronic  PRECAUTIONS: None  RED FLAGS: None   WEIGHT BEARING RESTRICTIONS: No  FALLS:  Has patient  fallen in last 6 months? No  LIVING ENVIRONMENT: Lives with: {OPRC lives with:25569::lives with their family} Lives in: {Lives in:25570} Stairs: {opstairs:27293} Has following equipment at home: {Assistive devices:23999}  OCCUPATION: ***  PLOF: {PLOF:24004}  PATIENT GOALS: ***  NEXT MD VISIT: ***  OBJECTIVE:  Note: Objective measures were completed at Evaluation unless otherwise noted.  DIAGNOSTIC FINDINGS:  Thoracic MRI: 3/24   IMPRESSION: 1. Normal MRI appearance of the thoracic spinal cord. No cord signal changes to suggest demyelinating disease. No abnormal enhancement. 2. Multilevel degenerative spondylosis with small disc protrusions at T3-4 through T9-10 as above. No significant spinal stenosis. 3. Mild multilevel facet hypertrophy throughout the thoracic spine, which could contribute to underlying back pain. 4. 1.7 cm nonenhancing cystic lesion within the upper left posterior paraspinous musculature at the level of T2-3, nonspecific, but almost certainly benign given appearance. No follow-up imaging recommended unless clinically warranted.  PATIENT SURVEYS:  {rehab surveys:24030}  COGNITION: Overall cognitive status: {cognition:24006}     SENSATION: {sensation:27233}  MUSCLE LENGTH: Hamstrings: Right *** deg; Left *** deg Debby test: Right *** deg; Left *** deg  POSTURE:  {posture:25561}  PALPATION: ***    UPPER EXTREMITY AROM:   ROM Right (Eval) Left (Eval)  Shoulder flexion WFL with pain past 90 WFL with pain past 90  Shoulder abduction      Shoulder internal rotation      Shoulder external rotation      Functional IR WFL* WFL*  Functional ER WFL* WFL*  Shoulder extension      Elbow extension      Elbow flexion        (Blank rows = not tested, N = WNL, * = concordant pain with testing)  UPPER EXTREMITY MMT:   MMT Right (Eval) Left (Eval)  Shoulder flexion 3+* 3+*  Shoulder abduction (C5)      Shoulder ER 3+* 3+*  Shoulder IR      Middle trapezius      Lower trapezius      Shoulder extension      Grip strength      Shoulder shrug (C4)      Elbow flexion (C6)      Elbow ext (C7)      Thumb ext (C8)      Finger abd (T1)      Grossly        (Blank rows = not tested, score listed is out of 5 possible points.  N = WNL, D = diminished, C = clear for gross weakness with myotome testing, * = concordant pain with testing)   LUMBAR ROM:   AROM eval  Flexion   Extension   Right lateral flexion   Left lateral flexion   Right rotation   Left rotation    (Blank rows = not tested)  LOWER EXTREMITY ROM:     {AROM/PROM:27142}  Right eval Left eval  Hip flexion    Hip extension    Hip abduction    Hip adduction    Hip internal rotation    Hip external rotation    Knee flexion    Knee extension    Ankle dorsiflexion    Ankle plantarflexion    Ankle inversion    Ankle eversion     (Blank rows = not tested)  LOWER EXTREMITY MMT:    MMT Right eval Left eval  Hip flexion    Hip extension    Hip abduction    Hip adduction    Hip internal rotation  Hip external rotation    Knee flexion    Knee extension    Ankle dorsiflexion    Ankle plantarflexion    Ankle inversion    Ankle eversion     (Blank rows = not tested)  LUMBAR SPECIAL TESTS:  {lumbar special test:25242}  FUNCTIONAL TESTS:  {Functional  tests:24029}  GAIT: Distance walked: *** Assistive device utilized: {Assistive devices:23999} Level of assistance: {Levels of assistance:24026} Comments: ***  TREATMENT DATE: ***                                                                                                                                 PATIENT EDUCATION:  Education details: *** Person educated: {Person educated:25204} Education method: {Education Method:25205} Education comprehension: {Education Comprehension:25206}  HOME EXERCISE PROGRAM: ***  ASSESSMENT:  CLINICAL IMPRESSION: Patient is a *** y.o. *** who was seen today for physical therapy evaluation and treatment for ***.   OBJECTIVE IMPAIRMENTS: {opptimpairments:25111}.   ACTIVITY LIMITATIONS: {activitylimitations:27494}  PARTICIPATION LIMITATIONS: {participationrestrictions:25113}  PERSONAL FACTORS: {Personal factors:25162} are also affecting patient's functional outcome.   REHAB POTENTIAL: {rehabpotential:25112}  CLINICAL DECISION MAKING: {clinical decision making:25114}  EVALUATION COMPLEXITY: {Evaluation complexity:25115}   GOALS: Goals reviewed with patient? {yes/no:20286}  SHORT TERM GOALS: Target date: ***  *** Baseline: Goal status: INITIAL  2.  *** Baseline:  Goal status: INITIAL  3.  *** Baseline:  Goal status: INITIAL  4.  *** Baseline:  Goal status: INITIAL  5.  *** Baseline:  Goal status: INITIAL  6.  *** Baseline:  Goal status: INITIAL  LONG TERM GOALS: Target date: ***  *** Baseline:  Goal status: INITIAL  2.  *** Baseline:  Goal status: INITIAL  3.  *** Baseline:  Goal status: INITIAL  4.  *** Baseline:  Goal status: INITIAL  5.  *** Baseline:  Goal status: INITIAL  6.  *** Baseline:  Goal status: INITIAL  PLAN:  PT FREQUENCY: {rehab frequency:25116}  PT DURATION: {rehab duration:25117}  PLANNED INTERVENTIONS: 97110-Therapeutic exercises, 97530- Therapeutic activity, 97112-  Neuromuscular re-education, 97535- Self Care, 02859- Manual therapy, [id number]- Gait training, [id number]- Aquatic Therapy, [phone] (1-2 muscles), 20561 (3+ muscles)- Dry Needling, Patient/Family education, Balance training, Stair training, Taping, Joint mobilization, DME instructions, Cryotherapy, and Moist heat.  PLAN FOR NEXT SESSION: PIERRETTE Shuck [place]) Lucita Montoya MPT 10/08/2024 4:06 PM [facility] [address] [place], [place], [address] Phone: [phone]   Fax:  [phone]   "

## 2024-10-09 ENCOUNTER — Ambulatory Visit (HOSPITAL_BASED_OUTPATIENT_CLINIC_OR_DEPARTMENT_OTHER): Payer: MEDICAID | Admitting: Physical Therapy
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
## 2024-10-15 ENCOUNTER — Ambulatory Visit (HOSPITAL_BASED_OUTPATIENT_CLINIC_OR_DEPARTMENT_OTHER): Payer: MEDICAID | Attending: Physical Medicine & Rehabilitation | Admitting: Physical Therapy

## 2024-10-15 ENCOUNTER — Encounter (HOSPITAL_BASED_OUTPATIENT_CLINIC_OR_DEPARTMENT_OTHER): Payer: Self-pay | Admitting: Physical Therapy

## 2024-10-15 ENCOUNTER — Other Ambulatory Visit: Payer: Self-pay

## 2024-10-15 DIAGNOSIS — M546 Pain in thoracic spine: Secondary | ICD-10-CM | POA: Diagnosis present

## 2024-10-15 DIAGNOSIS — M25512 Pain in left shoulder: Secondary | ICD-10-CM | POA: Diagnosis present

## 2024-10-15 DIAGNOSIS — M797 Fibromyalgia: Secondary | ICD-10-CM | POA: Insufficient documentation

## 2024-10-15 DIAGNOSIS — M542 Cervicalgia: Secondary | ICD-10-CM | POA: Insufficient documentation

## 2024-10-15 DIAGNOSIS — M79601 Pain in right arm: Secondary | ICD-10-CM | POA: Insufficient documentation

## 2024-10-15 DIAGNOSIS — M79602 Pain in left arm: Secondary | ICD-10-CM | POA: Diagnosis present

## 2024-10-15 DIAGNOSIS — G8929 Other chronic pain: Secondary | ICD-10-CM | POA: Insufficient documentation

## 2024-10-15 DIAGNOSIS — M25511 Pain in right shoulder: Secondary | ICD-10-CM | POA: Insufficient documentation

## 2024-10-22 ENCOUNTER — Ambulatory Visit (INDEPENDENT_AMBULATORY_CARE_PROVIDER_SITE_OTHER): Payer: MEDICAID

## 2024-10-22 DIAGNOSIS — F331 Major depressive disorder, recurrent, moderate: Secondary | ICD-10-CM

## 2024-10-22 DIAGNOSIS — F4010 Social phobia, unspecified: Secondary | ICD-10-CM

## 2024-10-22 DIAGNOSIS — F902 Attention-deficit hyperactivity disorder, combined type: Secondary | ICD-10-CM

## 2024-10-22 NOTE — Progress Notes (Signed)
 "  THERAPIST PROGRESS NOTE  Virtual Visit via Video Note  I connected with Derrick Carey on 10/22/2024 at  3:00 PM EST by a video enabled telemedicine application and verified that I am speaking with the correct person using two identifiers.  Location: Patient: Location in file Provider: Remote office   I discussed the limitations of evaluation and management by telemedicine and the availability of in person appointments. The patient expressed understanding and agreed to proceed.   I discussed the assessment and treatment plan with the patient. The patient was provided an opportunity to ask questions and all were answered. The patient agreed with the plan and demonstrated an understanding of the instructions.   The patient was advised to call back or seek an in-person evaluation if the symptoms worsen or if the condition fails to improve as anticipated.  I provided 28 minutes of non-face-to-face time during this encounter.  Derrick Carey L. Derrick Raney, LCSW   Session Time: 2:58pm to 3:26pm  Participation Level: Active  Behavioral Response: Neat Alert Euthymic  Type of Therapy: Individual Therapy  Treatment Goals addressed:  OP Depression       LTG: Reduce frequency, intensity, and duration of depression symptoms so that daily functioning is improved (Progressing)               LTG: Increase coping skills to manage depression and improve ability to perform daily activities (Progressing)               LTG: Derrick Carey will score less than 10 on the Patient Health Questionnaire (PHQ-9)  (Progressing)               STG: Derrick Carey will participate in at least 80% of scheduled individual psychotherapy sessions  (Progressing)               STG: Derrick Carey will complete at least 80% of assigned homework  (Progressing)                Work with Derrick to track symptoms, triggers, and/or skill use through a mood chart, diary card, or journal (Completed)              ProgressTowards Goals:  Progressing  Interventions: CBT and Motivational Interviewing  Summary: Derrick Carey is a 34 y.o. male who presents with with slight depressive symptoms, as reflected in his recent PHQ-9 score of 7, a significant improvement from his previous score of 25 on April 05, 2024. He acknowledges December as a challenging month due to the anniversary of his aunt's passing; however, he has focused on maintaining a positive outlook and providing support to his parents, who express appreciation for his help. Derrick Carey reported a single hallucination involving a non-textured dog, but he has not experienced any additional hallucinations. He engages in daily walks, which contribute positively to his mood stabilization. While he indicated he doesn't have much to discuss, he was engaged and pleasant during the session..   Suicidal/Homicidal: No without intent/plan  Therapist Response: the LCSW reviewed Derrick Carey's coping skills and assessed their effectiveness, affirming the positive impact of his current strategies. Notably, daily walks were incorporated into his coping repertoire to further enhance his mood and overall well-being. Motivational Interviewing (MI) techniques were employed, utilizing open-ended questions to facilitate communication when Derrick Carey had difficulty expressing himself. He was actively engaged throughout the session and is scheduled to return in four weeks for follow-up.  Plan: Return again in 4 weeks.  Diagnosis:   MDD (major depressive disorder), recurrent episode,  moderate (HCC)  Attention deficit hyperactivity disorder (ADHD), combined type  Social anxiety disorder  Collaboration of Care: Other None  Patient/Guardian was advised Release of Information must be obtained prior to any record release in order to collaborate their care with an outside provider. Patient/Guardian was advised if they have not already done so to contact the registration department to sign all necessary forms in order  for us  to release information regarding their care.   Consent: Patient/Guardian gives verbal consent for treatment and assignment of benefits for services provided during this visit. Patient/Guardian expressed understanding and agreed to proceed.   Kristen Bushway 10/22/2024  "

## 2024-10-22 NOTE — Progress Notes (Unsigned)
 BH MD Outpatient Progress Note  10/22/2024 10:13 AM Derrick Carey  MRN:  [id number]  Assessment:  Derrick Carey presents for follow-up evaluation. Today, 10/22/2024, patient reports continued benefit from Ritalin  for attention and organization with overall tolerability. He endorses episodes of low mood and irritability impacted by anniversary of his aunt's passing and other situational stressors but denies persistence of these symptoms. No acute safety concerns. He reports improvement in sleep with return to normal sleep schedule. He was encouraged to reschedule appointment with sleep medicine for further evaluation of snoring and daytime fatigue. He reports unclear benefit from Cymbalta  for mood and pain although identifies only intermittent adherence; explored strategies for promoting daily adherence to fully ascertain benefit and tolerability. If adherence remains an issue, he may benefit from transition to alternative medication such as Prozac due to longer half life. No changes to regimen at this time.   Patient was made aware of this provider's departure from [facility] at the end of Nov 2025 and that he will be transitioned to alternative provider in the clinic after this time. All questions/concerns addressed.  RTC in 2 months with next provider.  Identifying Information: Derrick Carey is a 34 y.o. male with a history of autism spectrum disorder, ADHD, HTN, and fibromyalgia who is an established patient with [facility]. Patient was formally diagnosed with ASD and ADHD in 3rd grade by psychologist; these diagnoses were felt to be corroborated on initial assessment. While there has been report of AVH in the chart, on further exploration these experiences are not felt to represent symptoms of primary psychosis but rather perceptual disturbances and intrusive thoughts during periods of sensory overload and intense emotional distress. He describes that intrusive thoughts of  harm to self are infrequent and fleeting and that he is able to successfully visualize throwing them away. He shows significant insight into these experiences as being borne out of place of overwhelm.  Plan:  # ASD  ADHD Past medication trials: Ritalin , Adderall, Concerta   Status of problem: chronic; ADHD - improving Interventions: -- Continue Ritalin  5 mg qAM (s7/24/25) -- Utox negative 04/30/24 -- BP and HR wnl 08/04/24   # History of MDD, recurrent Past medication trials: Lexapro  Status of problem: stable Interventions: -- Continue Cymbalta  30 mg daily; counseled on importance of daily adherence and recommended use of pill box and medication alarm  -- Patient recently started on Tramadol  by PM&R for pain management; risks/benefits of this medication including DDI and signs/sx of serotonin syndrome discussed. Continue careful monitoring and conservative titration as indicated. -- Continue pain management through PM&R -- Continue individual psychotherapy with Auryn Goldammer, LCSW   # Reported snoring, vivid dreams, daytime fatigue # Delayed sleep phase Status of problem: new problem to this provider Interventions: -- Referral to sleep medicine previously placed in July; encouraged patient to return their phone call to get rescheduled -- Previously counseled on sleep hygiene and gradually shifting bedtime earlier every few days  Patient was given contact information for behavioral health clinic and was instructed to call 911 for emergencies.   Subjective:  Chief Complaint:  No chief complaint on file.   Interval History:   Patient reports he is hanging in there - denies major changes or updates since last appointment.   He restarted Cymbalta  at 30 mg daily; taking it about a few times a week. Denies further GI issues. Identifies recovery from recent dental infection has impacted adherence. Taking Ritalin  daily and finds it helpful for  keeping brain in check and overall  focus. Denies adverse effects including HA, appetite suppression, chest pain, tachycardia.  Sleeping better this past week due to adhering to a better schedule; getting about 8-9 hours nightly. Was scheduled for sleep medicine appointment but was canceled as provider was sick; still needs to reschedule. Provided with number today.   Continues to take tramadol  for pain; pain has been better controlled.   Describes mood overall as a bit more frustrated than usual which he relates to this time of year - reflects on anniversary of aunt's passing in December. Reports intact interest and enjoyment in watching TV; playing video games. Goes for occasional walks with dad outside. Reports continued passive SI in the back of my skull but identifies these as intrusive and able to dismiss them. Denies active SI. Denies HI; occasional aggressive urges when frustrated but finds it very easy to dismiss these thoughts. Reports occasional visual illusions of moving shadows but denies overt AVH. Feels safe in the home.  Amenable to focusing on daily adherence to Cymbalta  to fully ascertain benefit and tolerability. Explored strategies for promoting medication adherence such as pill box.  No questions/concerns at this time.   PDMP: -- Methylphenidate  5 mg tablet QTY 30 last filled 07/09/24   Visit Diagnosis:  No diagnosis found.    Past Psychiatric History:  Diagnoses: autism spectrum disorder, ADHD combined type (diagnosed with ASD and ADHD by psychologist in 3rd grade) Medication trials: Lexapro , Ritalin , Adderall, Concerta   Previous psychiatrist/therapist: x1 in childhood when expressing SI in elementary school Hospitalizations: denies Suicide attempts: denies SIB: reports he may hit himself in the head when hand when overwhelmed; denies other self harm behaviors Hx of violence towards others: denies Current access to guns: denies Hx of trauma/abuse: denies Substance use:              -- Etoh: 1  rum and coke about 5 times a year             -- Denies use of illicit drugs including cannabis/CBD/THC             -- Tobacco: denies  Past Medical History:  Past Medical History:  Diagnosis Date   ADHD (attention deficit hyperactivity disorder)    Autism spectrum disorder    Chronic cough 08/12/2011   Onset ? 06/2019 - ENT eval per [facility] neg 11/15/19 included neg sinus ct -Allergy profile  05/19/20  >  Eos 0.2 /  IgE  15  - 05/19/2020 some better p max gerd/ 1st gen H1 blockers per guidelines  > add gabapentin  100 tid  - 07/14/2020 increased gabapentin  to 300 mg tid and  h1 p supper and hs only  (otherwise ? Make him too sleepy in daytime)    Depression    Dry skin 02/08/2012   1 mth hx of dry skin in back of right ear.   Allergic dermatitis vs contact dermatitis vs eczma   GERD (gastroesophageal reflux disease)    Lateral epicondylitis of right elbow 06/14/2018   Papule of skin 06/19/2015   Passive suicidal ideations 08/10/2022   Right arm pain 08/01/2018   Sinusitis, acute maxillary 11/26/2013    Past Surgical History:  Procedure Laterality Date   LACERATION REPAIR     in elementary school X 3    Family Psychiatric History:  Maternal aunt: depression Paternal uncle: died by suicide  Family History:  Family History  Problem Relation Age of Onset   Hyperlipidemia Mother  Hypertension Mother    Congenital heart disease Father    Heart attack Father    Arthritis Father    Liver disease Father        Fatty Liver   Hypertension Father    Congestive Heart Failure Father    Heart disease Father        Heart Attacks 3    Social History:  Academic/Vocational: completed 12th grade - had IEP; 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 - employment disrupted by complications from [employer]    Social History   Socioeconomic History   Marital status: Single    Spouse name: Not on file   Number of children: 0   Years of education: Not on file   Highest education level: Not on  file  Occupational History   Not on file  Tobacco Use   Smoking status: Former    Current packs/day: 0.50    Average packs/day: 0.5 packs/day for 1 year (0.5 ttl pk-yrs)    Types: Cigarettes    Passive exposure: Past   Smokeless tobacco: Never  Vaping Use   Vaping status: Never Used  Substance and Sexual Activity   Alcohol use: Yes    Comment: rarely   Drug use: No   Sexual activity: Not Currently  Other Topics Concern   Not on file  Social History Narrative   [employer] [employer] - Lives at home with Mom and Dad         Right Handed    Lives in a one story home    Social Drivers of Health   Tobacco Use: Medium Risk (10/15/2024)   Patient History    Smoking Tobacco Use: Former    Smokeless Tobacco Use: Never    Passive Exposure: Past  Physicist, Medical Strain: Medium Risk (04/05/2024)   Overall Financial Resource Strain (CARDIA)    Difficulty of Paying Living Expenses: Somewhat hard  Food Insecurity: No Food Insecurity (04/05/2024)   Epic    Worried About Programme Researcher, Broadcasting/film/video in the Last Year: Never true    Ran Out of Food in the Last Year: Never true  Transportation Needs: No Transportation Needs (04/05/2024)   Epic    Lack of Transportation (Medical): No    Lack of Transportation (Non-Medical): No  Physical Activity: Insufficiently Active (04/05/2024)   Exercise Vital Sign    Days of Exercise per Week: 4 days    Minutes of Exercise per Session: 10 min  Stress: Stress Concern Present (04/05/2024)   [employer] of Occupational Health - Occupational Stress Questionnaire    Feeling of Stress: Very much  Social Connections: Socially Isolated (04/05/2024)   Social Connection and Isolation Panel    Frequency of Communication with Friends and Family: Never    Frequency of Social Gatherings with Friends and Family: Never    Attends Religious Services: Never    Database Administrator or Organizations: No    Attends Banker Meetings: Never    Marital  Status: Never married  Depression (PHQ2-9): Medium Risk (09/28/2024)   Depression (PHQ2-9)    PHQ-2 Score: 7  Alcohol Screen: Low Risk (04/05/2024)   Alcohol Screen    Last Alcohol Screening Score (AUDIT): 1  Housing: Unknown (04/05/2024)   Epic    Unable to Pay for Housing in the Last Year: No    Number of Times Moved in the Last Year: Not on file    Homeless in the Last Year: No  Utilities: Not At Risk (  04/05/2024)   Epic    Threatened with loss of utilities: No  Health Literacy: Adequate Health Literacy (04/05/2024)   B1300 Health Literacy    Frequency of need for help with medical instructions: Rarely    Allergies:  Allergies  Allergen Reactions   Sulfa Antibiotics Rash    Current Medications: Current Outpatient Medications  Medication Sig Dispense Refill   amoxicillin -clavulanate (AUGMENTIN ) 875-125 MG tablet Take 1 tablet by mouth 2 (two) times daily. 20 tablet 0   baclofen  (LIORESAL ) 10 MG tablet Take 1 tablet (10 mg total) by mouth 3 (three) times daily. 90 tablet 0   benzonatate  (TESSALON ) 100 MG capsule Take 1 capsule (100 mg total) by mouth 2 (two) times daily as needed for cough. 20 capsule 0   diclofenac  Sodium (VOLTAREN  ARTHRITIS PAIN) 1 % GEL Apply 4 g topically 4 (four) times daily. 150 g 3   DULoxetine  (CYMBALTA ) 30 MG capsule Take 1 capsule (30 mg total) by mouth daily. 30 capsule 2   fluticasone  (FLONASE ) 50 MCG/ACT nasal spray Place 2 sprays into both nostrils daily. 16 g 6   ibuprofen  (ADVIL ,MOTRIN ) 800 MG tablet Take 1 tablet (800 mg total) by mouth every 8 (eight) hours as needed. 30 tablet 0   lisinopril  (ZESTRIL ) 20 MG tablet Take 1 tablet (20 mg total) by mouth at bedtime. 90 tablet 3   methylphenidate  (RITALIN ) 5 MG tablet Take 1 tablet (5 mg total) by mouth daily. 30 tablet 0   methylphenidate  (RITALIN ) 5 MG tablet Take 1 tablet (5 mg total) by mouth daily. 30 tablet 0   [START ON 10/29/2024] methylphenidate  (RITALIN ) 5 MG tablet Take 1 tablet (5 mg  total) by mouth daily. 30 tablet 0   pregabalin  (LYRICA ) 100 MG capsule Take 1 capsule (100 mg total) by mouth 2 (two) times daily. Take 150mg  twice a day for 3 weeks. If pain is not improved and tolerating in increase to three times a day 60 capsule 3   traMADol  (ULTRAM ) 50 MG tablet Take 1 tablet (50 mg total) by mouth 3 (three) times daily as needed. 90 tablet 3   No current facility-administered medications for this visit.    ROS: See above  Objective:  Psychiatric Specialty Exam: There were no vitals taken for this visit.There is no height or weight on file to calculate BMI.  General Appearance: Casual and Fairly Groomed; hair long  Eye Contact:  Good  Speech:  Clear and Coherent, Normal Rate, and stilted  Volume:  Normal  Mood:  hanging in there  Affect:  Euthymic; calm  Thought Content: Denies AVH; no overt delusional thought content   Suicidal Thoughts:  Reports intrusive passive SI; denies active SI  Homicidal Thoughts:  No  Thought Process:  Goal Directed and Linear  Orientation:  Full (Time, Place, and Person)    Memory:  Grossly intact   Judgment:  Good  Insight:  Good  Concentration:  Concentration: intact to interview  Recall:  not formally [employer] of Knowledge: Good  Language: Good  Psychomotor Activity:  Normal  Akathisia:  No  AIMS (if indicated): not done  Assets:  Communication Skills Desire for Improvement Housing Leisure Time Social Support Talents/Skills  ADL's:  Intact  Cognition: WNL  Sleep:  improving   PE: General: sits comfortably in view of camera; no acute distress  Pulm: no increased work of breathing on room air  MSK: all extremity movements appear intact Neuro: no focal neurological deficits observed  Gait &  Station: unable to assess by video    Metabolic Disorder Labs: Lab Results  Component Value Date   HGBA1C 5.0 08/30/2023   No results found for: PROLACTIN Lab Results  Component Value Date   CHOL 211 (H)  12/14/2023   TRIG 219 (H) 12/14/2023   HDL 28 (L) 12/14/2023   CHOLHDL 7.5 (H) 12/14/2023   VLDL 50 (H) 12/15/2015   LDLCALC 143 (H) 12/14/2023   LDLCALC 132 (H) 05/17/2023   Lab Results  Component Value Date   TSH 1.250 12/14/2023   TSH 1.08 10/29/2022    Therapeutic Level Labs: No results found for: LITHIUM No results found for: VALPROATE No results found for: CBMZ  Screenings:  GAD-7    Flowsheet Row Counselor from 07/30/2024 in [facility] Counselor from 04/05/2024 in [facility]  Total GAD-7 Score 12 20   PHQ2-9    Flowsheet Row Office Visit from 09/28/2024 in [facility] [place] Counselor from 07[facility]30/2024 in [facility] Office Visit from 07/20/2024 in [facility] . [facility] Office Visit from 07/02/2024 in [facility] Office Visit from 04/30/2024 in [facility]  PHQ-2 Total Score 2 2 2 4 1   PHQ-9 Total Score 7 7 5  -- 3   Flowsheet Row ED from 08/04/2024 in [facility] Emergency Department at [facility] Counselor from 04/05/2024 in [facility] ED from 08/03/2022 in [facility] Emergency Department at [facility]  C-SSRS RISK CATEGORY No Risk Moderate Risk No Risk    Collaboration of Care: Collaboration of Care: Medication Management AEB active medication management and Psychiatrist AEB established with this provider  Patient/Guardian was advised Release of Information must be obtained prior to any record release in order to collaborate their care with an outside provider. Patient/Guardian was advised if they have not already done so to contact the registration department to sign all necessary forms in order for us  to release information regarding their care.   Consent:  Patient/Guardian gives verbal consent for treatment and assignment of benefits for services provided during this visit. Patient/Guardian expressed understanding and agreed to proceed.   Televisit via video: I connected with patient on 10/22/2024 at  2:30 PM EST by a video enabled telemedicine application and verified that I am speaking with the correct person using two identifiers.  Location: Patient: home address in [place] Provider: remote office in [place]   I discussed the limitations of evaluation and management by telemedicine and the availability of in person appointments. The patient expressed understanding and agreed to proceed.  I discussed the assessment and treatment plan with the patient. The patient was provided an opportunity to ask questions and all were answered. The patient agreed with the plan and demonstrated an understanding of the instructions.   The patient was advised to call back or seek an in-person evaluation if the symptoms worsen or if the condition fails to improve as anticipated.  I provided 35 minutes dedicated to the care of this patient via video on the date of this encounter to include chart review, face-to-face time with the patient, medication management/counseling, documentation, brief supportive psychotherapy.  Bennetta Rudden, MD 10/22/2024, 10:13 AM

## 2024-10-25 ENCOUNTER — Encounter (HOSPITAL_COMMUNITY): Payer: MEDICAID

## 2024-11-01 ENCOUNTER — Encounter: Payer: MEDICAID | Attending: Physical Medicine & Rehabilitation | Admitting: Physical Medicine & Rehabilitation

## 2024-11-02 ENCOUNTER — Encounter: Payer: Self-pay | Admitting: Physical Medicine & Rehabilitation

## 2024-11-02 ENCOUNTER — Encounter: Payer: MEDICAID | Attending: Physical Medicine & Rehabilitation | Admitting: Physical Medicine & Rehabilitation

## 2024-11-02 VITALS — BP 147/77 | HR 69 | Ht 69.0 in | Wt 274.0 lb

## 2024-11-02 DIAGNOSIS — F32A Depression, unspecified: Secondary | ICD-10-CM | POA: Diagnosis not present

## 2024-11-02 DIAGNOSIS — G894 Chronic pain syndrome: Secondary | ICD-10-CM | POA: Diagnosis not present

## 2024-11-02 DIAGNOSIS — M797 Fibromyalgia: Secondary | ICD-10-CM | POA: Diagnosis not present
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
## 2024-11-06 ENCOUNTER — Ambulatory Visit (HOSPITAL_BASED_OUTPATIENT_CLINIC_OR_DEPARTMENT_OTHER): Payer: MEDICAID | Admitting: Physical Therapy

## 2024-11-16 ENCOUNTER — Ambulatory Visit (HOSPITAL_BASED_OUTPATIENT_CLINIC_OR_DEPARTMENT_OTHER): Payer: Self-pay | Attending: Physical Medicine & Rehabilitation | Admitting: Physical Therapy

## 2024-11-19 ENCOUNTER — Encounter (HOSPITAL_COMMUNITY): Payer: Self-pay

## 2024-11-19 ENCOUNTER — Ambulatory Visit (HOSPITAL_COMMUNITY): Payer: MEDICAID

## 2024-11-23 ENCOUNTER — Encounter: Payer: MEDICAID | Admitting: Physical Medicine & Rehabilitation

## 2024-12-14 ENCOUNTER — Ambulatory Visit (HOSPITAL_COMMUNITY): Payer: MEDICAID

## 2024-12-20 ENCOUNTER — Encounter: Payer: MEDICAID | Admitting: Physical Medicine & Rehabilitation

## 2024-12-26 ENCOUNTER — Ambulatory Visit (HOSPITAL_BASED_OUTPATIENT_CLINIC_OR_DEPARTMENT_OTHER): Payer: MEDICAID | Admitting: Physical Therapy
